# Patient Record
Sex: Male | Born: 1970 | Race: White | Hispanic: No | Marital: Married | Smoking: Never smoker
Health system: Southern US, Community
[De-identification: ages and names within clinical notes are randomized; demographics above are authoritative.]

## PROBLEM LIST (undated history)

## (undated) DIAGNOSIS — R601 Generalized edema: Secondary | ICD-10-CM

## (undated) DIAGNOSIS — R809 Proteinuria, unspecified: Secondary | ICD-10-CM

## (undated) DIAGNOSIS — E785 Hyperlipidemia, unspecified: Secondary | ICD-10-CM

## (undated) DIAGNOSIS — I10 Essential (primary) hypertension: Secondary | ICD-10-CM

## (undated) DIAGNOSIS — E119 Type 2 diabetes mellitus without complications: Secondary | ICD-10-CM

## (undated) DIAGNOSIS — N184 Chronic kidney disease, stage 4 (severe): Secondary | ICD-10-CM

## (undated) DIAGNOSIS — N032 Chronic nephritic syndrome with diffuse membranous glomerulonephritis: Secondary | ICD-10-CM

## (undated) DIAGNOSIS — G4730 Sleep apnea, unspecified: Secondary | ICD-10-CM

## (undated) DIAGNOSIS — C44629 Squamous cell carcinoma of skin of left upper limb, including shoulder: Secondary | ICD-10-CM

## (undated) HISTORY — PX: CARPAL TUNNEL RELEASE: SHX101

## (undated) HISTORY — DX: Essential (primary) hypertension: I10

## (undated) HISTORY — PX: ANTERIOR CERVICAL DECOMP/DISCECTOMY FUSION: SHX1161

## (undated) HISTORY — PX: SQUAMOUS CELL CARCINOMA EXCISION: SHX2433

## (undated) HISTORY — PX: TONSILLECTOMY: SUR1361

## (undated) HISTORY — DX: Hyperlipidemia, unspecified: E78.5

## (undated) HISTORY — DX: Squamous cell carcinoma of skin of left upper limb, including shoulder: C44.629

---

## 2006-06-19 ENCOUNTER — Emergency Department: Payer: Self-pay | Admitting: General Practice

## 2013-04-12 ENCOUNTER — Ambulatory Visit: Payer: Self-pay | Admitting: Anesthesiology

## 2013-04-14 ENCOUNTER — Ambulatory Visit: Payer: Self-pay | Admitting: Unknown Physician Specialty

## 2014-12-28 NOTE — Op Note (Signed)
PATIENT NAME:  Russell Grant, Russell Grant MR#:  [id number] DATE OF BIRTH:  11-23-1970  DATE OF PROCEDURE:  04/14/2013  PREOPERATIVE DIAGNOSIS: Bilateral carpal tunnel syndrome.   POSTOPERATIVE DIAGNOSIS: Bilateral carpal tunnel syndrome.   OPERATION: Bilateral carpal tunnel releases.   SURGEON: Kathrene Alu., MD  ANESTHESIA: General.   HISTORY: The patient had a long history of bilateral carpal tunnel syndrome that was confirmed with nerve conduction studies. He had fairly significant median nerve compression bilaterally.   The patient was ultimately brought in for surgery because of his failure to respond to conservative treatment.   DESCRIPTION OF PROCEDURE: The patient was taken the operating room, where satisfactory general anesthesia was achieved. Tourniquets were applied to both upper arms. Both upper extremities were prepped and draped in the usual fashion for carpal tunnel releases. I will describe the procedure on the right, since the procedure on the left wrist was identical.   The right upper extremity was exsanguinated, and the tourniquet was inflated. A slightly curved incision was made over the volar aspect of the right wrist carpal tunnel. I dissected down to the palmar fascia. The distal edge of the transverse carpal ligament was identified and a hemostat was inserted underneath the ligament to protect the median nerve. I then divided the ligament in its entirety. Distally I used a knife to divide the ligament and proximally I used scissors.   Inspection of the median nerve revealed it was somewhat flattened underneath the ligament. No mass was appreciated within the carpal tunnel, and there was no evidence for proliferative synovitis.   The tourniquet was released. It was up 12 minutes. Bleeding was controlled with coagulation cautery and digital pressure. I went ahead and injected about 8 mL of 0.5% Marcaine into the subcutaneous tissue of the incision and the skin was closed  with 4-0 nylon in vertical mattress fashion. Sterile dressing was applied, followed by removeable-type of Velcro volar splint.   The procedure on the left wrist was identical. The only difference was that the tourniquet was only up 9 minutes on the left. I did anesthetize the wound after the transverse carpal ligament was released. I used about 10 mL of 0.5% Marcaine in the left incision. Again, a sterile dressing applied along with a removal-type Velcro volar splint.   The patient was awakened and transferred to his stretcher bed. He was taken to the recovery room in satisfactory condition. Tourniquet time on the right, as I mentioned, was 12 minutes, and on the left  was 9 minutes. Blood loss was negligible.   ____________________________ Kathrene Alu., MD hbk:OSi D: 04/14/2013 12:43:59 ET T: 04/14/2013 13:01:32 ET JOB#: [id number]  cc: Kathrene Alu., MD, <Dictator> Kathrene Alu MD ELECTRONICALLY SIGNED 04/17/2013 16:36

## 2015-06-11 ENCOUNTER — Encounter: Payer: Self-pay | Admitting: Family Medicine

## 2015-06-11 ENCOUNTER — Ambulatory Visit (INDEPENDENT_AMBULATORY_CARE_PROVIDER_SITE_OTHER): Payer: BLUE CROSS/BLUE SHIELD | Admitting: Family Medicine

## 2015-06-11 VITALS — BP 120/74 | HR 76 | Temp 98.9°F | Ht 70.0 in | Wt 282.0 lb

## 2015-06-11 DIAGNOSIS — E785 Hyperlipidemia, unspecified: Secondary | ICD-10-CM

## 2015-06-11 DIAGNOSIS — E781 Pure hyperglyceridemia: Secondary | ICD-10-CM | POA: Insufficient documentation

## 2015-06-11 DIAGNOSIS — Z23 Encounter for immunization: Secondary | ICD-10-CM

## 2015-06-11 DIAGNOSIS — I10 Essential (primary) hypertension: Secondary | ICD-10-CM | POA: Diagnosis not present

## 2015-06-11 DIAGNOSIS — E7849 Other hyperlipidemia: Secondary | ICD-10-CM | POA: Insufficient documentation

## 2015-06-11 MED ORDER — BENAZEPRIL HCL 40 MG PO TABS: 40.0000 mg | ORAL_TABLET | Freq: Every day | ORAL | Status: DC

## 2015-06-11 MED ORDER — SIMVASTATIN 20 MG PO TABS: 20.0000 mg | ORAL_TABLET | Freq: Every day | ORAL | Status: DC

## 2015-06-11 NOTE — Progress Notes (Signed)
   BP 120/74 mmHg  Pulse 76  Temp(Src) 98.9 F (37.2 C)  Ht 5\' 10"  (1.778 m)  Wt 282 lb (127.914 kg)  BMI 40.46 kg/m2  SpO2 97%   Subjective:    Patient ID: Russell Grant, male    DOB: Jan 16, 1971, 44 y.o.   MRN: [id number]  HPI: Russell Grant is a 44 y.o. male  Chief Complaint  Patient presents with  . Follow-up    patient thinks he missed last appt   Patient doing well with medications  No side effects takes medicines faithfully blood pressures doing well Takes simvastatin without any side effects no myalgias Missed his last physical Patient's been gaining weight this summer. Blood pressure when checked in the office doing okay.  Relevant past medical, surgical, family and social history reviewed and updated as indicated. Interim medical history since our last visit reviewed. Allergies and medications reviewed and updated.  Review of Systems  Constitutional: Negative.   Respiratory: Negative.   Cardiovascular: Negative.     Per HPI unless specifically indicated above     Objective:    BP 120/74 mmHg  Pulse 76  Temp(Src) 98.9 F (37.2 C)  Ht 5\' 10"  (1.778 m)  Wt 282 lb (127.914 kg)  BMI 40.46 kg/m2  SpO2 97%  Wt Readings from Last 3 Encounters:  06/11/15 282 lb (127.914 kg)  11/27/14 275 lb (124.739 kg)    Physical Exam  Constitutional: He is oriented to person, place, and time. He appears well-developed and well-nourished. No distress.  HENT:  Head: Normocephalic and atraumatic.  Right Ear: Hearing normal.  Left Ear: Hearing normal.  Nose: Nose normal.  Eyes: Conjunctivae and lids are normal. Right eye exhibits no discharge. Left eye exhibits no discharge. No scleral icterus.  Cardiovascular: Normal rate, regular rhythm and normal heart sounds.   Pulmonary/Chest: Effort normal and breath sounds normal. No respiratory distress.  Musculoskeletal: Normal range of motion.  Neurological: He is alert and oriented to person, place, and time.  Skin: Skin  is intact. No rash noted.  Psychiatric: He has a normal mood and affect. His speech is normal and behavior is normal. Judgment and thought content normal. Cognition and memory are normal.    No results found for this or any previous visit.    Assessment & Plan:   Problem List Items Addressed This Visit      Cardiovascular and Mediastinum   Hypertension    The current medical regimen is effective;  continue present plan and medications.       Relevant Medications   simvastatin (ZOCOR) 20 MG tablet   benazepril (LOTENSIN) 40 MG tablet     Other   Hyperlipidemia    Discuss continue medications Discuss weight loss Diet Exercise       Relevant Medications   simvastatin (ZOCOR) 20 MG tablet   benazepril (LOTENSIN) 40 MG tablet    Other Visit Diagnoses    Immunization due    -  Primary    Relevant Orders    Flu Vaccine QUAD 36+ mos PF IM (Fluarix & Fluzone Quad PF) (Completed)        Follow up plan: Return in about 2 months (around 08/11/2015), or if symptoms worsen or fail to improve, for Physical Exam.

## 2015-06-11 NOTE — Assessment & Plan Note (Signed)
The current medical regimen is effective;  continue present plan and medications.  

## 2015-06-11 NOTE — Assessment & Plan Note (Signed)
Discuss continue medications Discuss weight loss Diet Exercise

## 2015-08-13 ENCOUNTER — Ambulatory Visit: Payer: BLUE CROSS/BLUE SHIELD | Admitting: Family Medicine

## 2015-08-13 ENCOUNTER — Encounter: Payer: Self-pay | Admitting: Family Medicine

## 2015-08-13 ENCOUNTER — Ambulatory Visit (INDEPENDENT_AMBULATORY_CARE_PROVIDER_SITE_OTHER): Payer: BLUE CROSS/BLUE SHIELD | Admitting: Family Medicine

## 2015-08-13 VITALS — BP 119/74 | HR 76 | Temp 98.3°F | Ht 69.7 in | Wt 282.0 lb

## 2015-08-13 DIAGNOSIS — E785 Hyperlipidemia, unspecified: Secondary | ICD-10-CM | POA: Diagnosis not present

## 2015-08-13 DIAGNOSIS — I10 Essential (primary) hypertension: Secondary | ICD-10-CM | POA: Diagnosis not present

## 2015-08-13 DIAGNOSIS — Z0000 Encounter for general adult medical examination without abnormal findings: Secondary | ICD-10-CM

## 2015-08-13 LAB — URINALYSIS, ROUTINE W REFLEX MICROSCOPIC
Bilirubin, UA: NEGATIVE
Glucose, UA: NEGATIVE
Ketones, UA: NEGATIVE
Leukocytes, UA: NEGATIVE
Nitrite, UA: NEGATIVE
Protein, UA: NEGATIVE
RBC, UA: NEGATIVE
Specific Gravity, UA: 1.025 (ref 1.005–1.030)
Urobilinogen, Ur: 0.2 mg/dL (ref 0.2–1.0)
pH, UA: 5.5 (ref 5.0–7.5)

## 2015-08-13 MED ORDER — SIMVASTATIN 20 MG PO TABS: 20.0000 mg | ORAL_TABLET | Freq: Every day | ORAL | Status: DC

## 2015-08-13 MED ORDER — BENAZEPRIL HCL 40 MG PO TABS: 40.0000 mg | ORAL_TABLET | Freq: Every day | ORAL | Status: DC

## 2015-08-13 NOTE — Assessment & Plan Note (Signed)
The current medical regimen is effective;  continue present plan and medications.  

## 2015-08-13 NOTE — Progress Notes (Signed)
BP 119/74 mmHg  Pulse 76  Temp(Src) 98.3 F (36.8 C)  Ht 5' 9.7" (1.77 m)  Wt 282 lb (127.914 kg)  BMI 40.83 kg/m2   Subjective:    Patient ID: Russell Grant, male    DOB: 02-Dec-1970, 44 y.o.   MRN: [id number]  HPI: Russell Grant is a 44 y.o. male  Chief Complaint  Patient presents with  . Annual Exam   patient also follow-up blood pressure cholesterol takes medications faithfully without side effects  Relevant past medical, surgical, family and social history reviewed and updated as indicated. Interim medical history since our last visit reviewed. Allergies and medications reviewed and updated.  Review of Systems  Constitutional: Negative.   HENT: Negative.   Eyes: Negative.   Respiratory: Negative.   Cardiovascular: Negative.   Gastrointestinal: Negative.   Endocrine: Negative.   Genitourinary: Negative.   Musculoskeletal: Negative.   Skin: Negative.   Allergic/Immunologic: Negative.   Neurological: Negative.   Hematological: Negative.   Psychiatric/Behavioral: Negative.     Per HPI unless specifically indicated above     Objective:    BP 119/74 mmHg  Pulse 76  Temp(Src) 98.3 F (36.8 C)  Ht 5' 9.7" (1.77 m)  Wt 282 lb (127.914 kg)  BMI 40.83 kg/m2  Wt Readings from Last 3 Encounters:  08/13/15 282 lb (127.914 kg)  06/11/15 282 lb (127.914 kg)  11/27/14 275 lb (124.739 kg)    Physical Exam  Constitutional: He is oriented to person, place, and time. He appears well-developed and well-nourished.  HENT:  Head: Normocephalic and atraumatic.  Right Ear: External ear normal.  Left Ear: External ear normal.  Eyes: Conjunctivae and EOM are normal. Pupils are equal, round, and reactive to light.  Neck: Normal range of motion. Neck supple.  Cardiovascular: Normal rate, regular rhythm, normal heart sounds and intact distal pulses.   Pulmonary/Chest: Effort normal and breath sounds normal.  Abdominal: Soft. Bowel sounds are normal. There is no  splenomegaly or hepatomegaly.  Genitourinary: Rectum normal, prostate normal and penis normal.  Musculoskeletal: Normal range of motion.  Neurological: He is alert and oriented to person, place, and time. He has normal reflexes.  Skin: No rash noted. No erythema.  Psychiatric: He has a normal mood and affect. His behavior is normal. Judgment and thought content normal.    No results found for this or any previous visit.    Assessment & Plan:   Problem List Items Addressed This Visit      Cardiovascular and Mediastinum   Hypertension - Primary    The current medical regimen is effective;  continue present plan and medications.       Relevant Medications   benazepril (LOTENSIN) 40 MG tablet   simvastatin (ZOCOR) 20 MG tablet     Other   Hyperlipidemia    The current medical regimen is effective;  continue present plan and medications.       Relevant Medications   benazepril (LOTENSIN) 40 MG tablet   simvastatin (ZOCOR) 20 MG tablet    Other Visit Diagnoses    PE (physical exam), annual        Relevant Orders    Comprehensive metabolic panel    Lipid panel    CBC with Differential/Platelet    PSA    Urinalysis, Routine w reflex microscopic (not at [facility] [address])    TSH        Follow up plan: Return in about 6 months (around 02/11/2016), or if symptoms worsen or  fail to improve, for Med check with BMP, lipids, ALT, AST.

## 2015-08-14 ENCOUNTER — Encounter: Payer: Self-pay | Admitting: Family Medicine

## 2015-08-14 LAB — COMPREHENSIVE METABOLIC PANEL
ALT: 34 IU/L (ref 0–44)
AST: 29 IU/L (ref 0–40)
Albumin/Globulin Ratio: 1.7 (ref 1.1–2.5)
Albumin: 4.7 g/dL (ref 3.5–5.5)
Alkaline Phosphatase: 83 IU/L (ref 39–117)
BUN/Creatinine Ratio: 13 (ref 9–20)
BUN: 13 mg/dL (ref 6–24)
Bilirubin Total: 0.6 mg/dL (ref 0.0–1.2)
CO2: 21 mmol/L (ref 18–29)
Calcium: 9.4 mg/dL (ref 8.7–10.2)
Chloride: 102 mmol/L (ref 97–106)
Creatinine, Ser: 1 mg/dL (ref 0.76–1.27)
GFR calc Af Amer: 105 mL/min/{1.73_m2} (ref >59)
GFR calc non Af Amer: 90 mL/min/{1.73_m2} (ref >59)
Globulin, Total: 2.7 g/dL (ref 1.5–4.5)
Glucose: 91 mg/dL (ref 65–99)
Potassium: 5 mmol/L (ref 3.5–5.2)
Sodium: 144 mmol/L (ref 136–144)
Total Protein: 7.4 g/dL (ref 6.0–8.5)

## 2015-08-14 LAB — PSA: Prostate Specific Ag, Serum: 0.3 ng/mL (ref 0.0–4.0)

## 2015-08-14 LAB — CBC WITH DIFFERENTIAL/PLATELET
Basophils Absolute: 0 10*3/uL (ref 0.0–0.2)
Basos: 0 %
EOS (ABSOLUTE): 0.1 10*3/uL (ref 0.0–0.4)
Eos: 1 %
Hematocrit: 45.3 % (ref 37.5–51.0)
Hemoglobin: 15.5 g/dL (ref 12.6–17.7)
Immature Grans (Abs): 0 10*3/uL (ref 0.0–0.1)
Immature Granulocytes: 0 %
Lymphocytes Absolute: 2.9 10*3/uL (ref 0.7–3.1)
Lymphs: 31 %
MCH: 29.9 pg (ref 26.6–33.0)
MCHC: 34.2 g/dL (ref 31.5–35.7)
MCV: 87 fL (ref 79–97)
Monocytes Absolute: 0.6 10*3/uL (ref 0.1–0.9)
Monocytes: 7 %
Neutrophils Absolute: 5.6 10*3/uL (ref 1.4–7.0)
Neutrophils: 61 %
Platelets: 204 10*3/uL (ref 150–379)
RBC: 5.19 x10E6/uL (ref 4.14–5.80)
RDW: 13.7 % (ref 12.3–15.4)
WBC: 9.3 10*3/uL (ref 3.4–10.8)

## 2015-08-14 LAB — TSH: TSH: 1.61 u[IU]/mL (ref 0.450–4.500)

## 2015-08-14 LAB — LIPID PANEL
Chol/HDL Ratio: 4.5 ratio units (ref 0.0–5.0)
Cholesterol, Total: 144 mg/dL (ref 100–199)
HDL: 32 mg/dL — ABNORMAL LOW (ref >39)
LDL Calculated: 53 mg/dL (ref 0–99)
Triglycerides: 294 mg/dL — ABNORMAL HIGH (ref 0–149)
VLDL Cholesterol Cal: 59 mg/dL — ABNORMAL HIGH (ref 5–40)

## 2016-01-12 ENCOUNTER — Other Ambulatory Visit: Payer: Self-pay | Admitting: Family Medicine

## 2016-02-11 ENCOUNTER — Encounter: Payer: Self-pay | Admitting: Family Medicine

## 2016-02-11 ENCOUNTER — Ambulatory Visit (INDEPENDENT_AMBULATORY_CARE_PROVIDER_SITE_OTHER): Payer: BLUE CROSS/BLUE SHIELD | Admitting: Family Medicine

## 2016-02-11 ENCOUNTER — Other Ambulatory Visit: Payer: Self-pay | Admitting: Family Medicine

## 2016-02-11 VITALS — BP 120/74 | HR 74 | Temp 98.2°F | Ht 70.1 in | Wt 286.0 lb

## 2016-02-11 DIAGNOSIS — E785 Hyperlipidemia, unspecified: Secondary | ICD-10-CM | POA: Diagnosis not present

## 2016-02-11 DIAGNOSIS — I10 Essential (primary) hypertension: Secondary | ICD-10-CM | POA: Diagnosis not present

## 2016-02-11 NOTE — Assessment & Plan Note (Addendum)
Discuss lab results to light. To run here and sent out discuss low triglyceride and low cholesterol diet exercise nutrition patient will continue simvastatin and fish oil.

## 2016-02-11 NOTE — Progress Notes (Signed)
BP 120/74 mmHg  Pulse 74  Temp(Src) 98.2 F (36.8 C)  Ht 5' 10.1" (1.781 m)  Wt 286 lb (129.729 kg)  BMI 40.90 kg/m2  SpO2 98%   Subjective:    Patient ID: Russell Grant, male    DOB: 22-Nov-1970, 45 y.o.   MRN: [id number]  HPI: Russell Grant is a 45 y.o. male  Chief Complaint  Patient presents with  . Hypertension  . Hyperlipidemia  Patient all in all doing well with no complaints from medications over the last month or so has had some difficulty swallowing more pills afternoon or evening pills or no problem. Blood pressures doing well no complaints from simvastatin no cardiovascular symptoms taking medications faithfully without side effects.   Relevant past medical, surgical, family and social history reviewed and updated as indicated. Interim medical history since our last visit reviewed. Allergies and medications reviewed and updated.  Review of Systems  Constitutional: Negative.   Respiratory: Negative.   Cardiovascular: Negative.     Per HPI unless specifically indicated above      Objective:    BP 120/74 mmHg  Pulse 74  Temp(Src) 98.2 F (36.8 C)  Ht 5' 10.1" (1.781 m)  Wt 286 lb (129.729 kg)  BMI 40.90 kg/m2  SpO2 98%  Wt Readings from Last 3 Encounters:  02/11/16 286 lb (129.729 kg)  08/13/15 282 lb (127.914 kg)  06/11/15 282 lb (127.914 kg)    Physical Exam  Constitutional: He is oriented to person, place, and time. He appears well-developed and well-nourished. No distress.  HENT:  Head: Normocephalic and atraumatic.  Right Ear: Hearing normal.  Left Ear: Hearing normal.  Nose: Nose normal.  Eyes: Conjunctivae and lids are normal. Right eye exhibits no discharge. Left eye exhibits no discharge. No scleral icterus.  Cardiovascular: Normal rate, regular rhythm and normal heart sounds.   Pulmonary/Chest: Effort normal and breath sounds normal. No respiratory distress.  Musculoskeletal: Normal range of motion.  Neurological: He is alert and  oriented to person, place, and time.  Skin: Skin is intact. No rash noted.  Psychiatric: He has a normal mood and affect. His speech is normal and behavior is normal. Judgment and thought content normal. Cognition and memory are normal.    Results for orders placed or performed in visit on 08/13/15  Comprehensive metabolic panel  Result Value Ref Range   Glucose 91 65 - 99 mg/dL   BUN 13 6 - 24 mg/dL   Creatinine, Ser 1.00 0.76 - 1.27 mg/dL   GFR calc non Af Amer 90 >59 mL/min/1.73   GFR calc Af Amer 105 >59 mL/min/1.73   BUN/Creatinine Ratio 13 9 - 20   Sodium 144 136 - 144 mmol/L   Potassium 5.0 3.5 - 5.2 mmol/L   Chloride 102 97 - 106 mmol/L   CO2 21 18 - 29 mmol/L   Calcium 9.4 8.7 - 10.2 mg/dL   Total Protein 7.4 6.0 - 8.5 g/dL   Albumin 4.7 3.5 - 5.5 g/dL   Globulin, Total 2.7 1.5 - 4.5 g/dL   Albumin/Globulin Ratio 1.7 1.1 - 2.5   Bilirubin Total 0.6 0.0 - 1.2 mg/dL   Alkaline Phosphatase 83 39 - 117 IU/L   AST 29 0 - 40 IU/L   ALT 34 0 - 44 IU/L  Lipid panel  Result Value Ref Range   Cholesterol, Total 144 100 - 199 mg/dL   Triglycerides 294 (H) 0 - 149 mg/dL   HDL 32 (L) >39 mg/dL  VLDL Cholesterol Cal 59 (H) 5 - 40 mg/dL   LDL Calculated 53 0 - 99 mg/dL   Chol/HDL Ratio 4.5 0.0 - 5.0 ratio units  CBC with Differential/Platelet  Result Value Ref Range   WBC 9.3 3.4 - 10.8 x10E3/uL   RBC 5.19 4.14 - 5.80 x10E6/uL   Hemoglobin 15.5 12.6 - 17.7 g/dL   Hematocrit 45.3 37.5 - 51.0 %   MCV 87 79 - 97 fL   MCH 29.9 26.6 - 33.0 pg   MCHC 34.2 31.5 - 35.7 g/dL   RDW 13.7 12.3 - 15.4 %   Platelets 204 150 - 379 x10E3/uL   Neutrophils 61 %   Lymphs 31 %   Monocytes 7 %   Eos 1 %   Basos 0 %   Neutrophils Absolute 5.6 1.4 - 7.0 x10E3/uL   Lymphocytes Absolute 2.9 0.7 - 3.1 x10E3/uL   Monocytes Absolute 0.6 0.1 - 0.9 x10E3/uL   EOS (ABSOLUTE) 0.1 0.0 - 0.4 x10E3/uL   Basophils Absolute 0.0 0.0 - 0.2 x10E3/uL   Immature Granulocytes 0 %   Immature Grans (Abs) 0.0  0.0 - 0.1 x10E3/uL  PSA  Result Value Ref Range   Prostate Specific Ag, Serum 0.3 0.0 - 4.0 ng/mL  Urinalysis, Routine w reflex microscopic (not at [facility])  Result Value Ref Range   Specific Gravity, UA 1.025 1.005 - 1.030   pH, UA 5.5 5.0 - 7.5   Color, UA Yellow Yellow   Appearance Ur Clear Clear   Leukocytes, UA Negative Negative   Protein, UA Negative Negative/Trace   Glucose, UA Negative Negative   Ketones, UA Negative Negative   RBC, UA Negative Negative   Bilirubin, UA Negative Negative   Urobilinogen, Ur 0.2 0.2 - 1.0 mg/dL   Nitrite, UA Negative Negative  TSH  Result Value Ref Range   TSH 1.610 0.450 - 4.500 uIU/mL      Assessment & Plan:   Problem List Items Addressed This Visit      Cardiovascular and Mediastinum   Hypertension    The current medical regimen is effective;  continue present plan and medications.       Relevant Orders   Basic metabolic panel   LP+ALT+AST Piccolo, Waived     Other   Hyperlipidemia - Primary    Discuss lab results to light. To run here and sent out discuss low triglyceride and low cholesterol diet exercise nutrition patient will continue simvastatin and fish oil.      Relevant Orders   Basic metabolic panel   LP+ALT+AST Piccolo, Waived       Follow up plan: Return in about 6 months (around 08/12/2016) for Physical Exam.

## 2016-02-11 NOTE — Assessment & Plan Note (Signed)
The current medical regimen is effective;  continue present plan and medications.  

## 2016-02-12 ENCOUNTER — Encounter: Payer: Self-pay | Admitting: Family Medicine

## 2016-02-12 LAB — BASIC METABOLIC PANEL
BUN/Creatinine Ratio: 12 (ref 9–20)
BUN: 12 mg/dL (ref 6–24)
CO2: 26 mmol/L (ref 18–29)
Calcium: 9.5 mg/dL (ref 8.7–10.2)
Chloride: 103 mmol/L (ref 96–106)
Creatinine, Ser: 1.04 mg/dL (ref 0.76–1.27)
GFR calc Af Amer: 99 mL/min/{1.73_m2} (ref >59)
GFR calc non Af Amer: 86 mL/min/{1.73_m2} (ref >59)
Glucose: 92 mg/dL (ref 65–99)
Potassium: 5.2 mmol/L (ref 3.5–5.2)
Sodium: 142 mmol/L (ref 134–144)

## 2016-02-13 LAB — LIPID PANEL W/O CHOL/HDL RATIO
Cholesterol, Total: 143 mg/dL (ref 100–199)
HDL: 28 mg/dL — ABNORMAL LOW (ref >39)
LDL Calculated: 39 mg/dL (ref 0–99)
Triglycerides: 381 mg/dL — ABNORMAL HIGH (ref 0–149)
VLDL Cholesterol Cal: 76 mg/dL — ABNORMAL HIGH (ref 5–40)

## 2016-02-13 LAB — LP+ALT+AST PICCOLO, WAIVED
ALT (SGPT) Piccolo, Waived: 32 U/L (ref 10–47)
AST (SGOT) Piccolo, Waived: 31 U/L (ref 11–38)

## 2016-02-13 LAB — SPECIMEN STATUS REPORT

## 2016-07-14 ENCOUNTER — Other Ambulatory Visit: Payer: Self-pay | Admitting: Family Medicine

## 2016-07-14 NOTE — Telephone Encounter (Signed)
Routing to provider  

## 2016-08-18 ENCOUNTER — Encounter: Payer: Self-pay | Admitting: Family Medicine

## 2016-08-18 ENCOUNTER — Ambulatory Visit (INDEPENDENT_AMBULATORY_CARE_PROVIDER_SITE_OTHER): Payer: BLUE CROSS/BLUE SHIELD | Admitting: Family Medicine

## 2016-08-18 VITALS — BP 131/65 | HR 79 | Temp 98.1°F | Ht 69.75 in | Wt 277.2 lb

## 2016-08-18 DIAGNOSIS — E7800 Pure hypercholesterolemia, unspecified: Secondary | ICD-10-CM

## 2016-08-18 DIAGNOSIS — Z0000 Encounter for general adult medical examination without abnormal findings: Secondary | ICD-10-CM

## 2016-08-18 DIAGNOSIS — Z23 Encounter for immunization: Secondary | ICD-10-CM | POA: Diagnosis not present

## 2016-08-18 DIAGNOSIS — Z0001 Encounter for general adult medical examination with abnormal findings: Secondary | ICD-10-CM

## 2016-08-18 DIAGNOSIS — I10 Essential (primary) hypertension: Secondary | ICD-10-CM

## 2016-08-18 LAB — URINALYSIS, ROUTINE W REFLEX MICROSCOPIC
Bilirubin, UA: NEGATIVE
Glucose, UA: NEGATIVE
Ketones, UA: NEGATIVE
Leukocytes, UA: NEGATIVE
Nitrite, UA: NEGATIVE
Protein, UA: NEGATIVE
RBC, UA: NEGATIVE
Specific Gravity, UA: 1.025 (ref 1.005–1.030)
Urobilinogen, Ur: 0.2 mg/dL (ref 0.2–1.0)
pH, UA: 6 (ref 5.0–7.5)

## 2016-08-18 MED ORDER — BENAZEPRIL HCL 40 MG PO TABS: 40.0000 mg | ORAL_TABLET | Freq: Every day | ORAL | 4 refills | Status: DC

## 2016-08-18 MED ORDER — SIMVASTATIN 20 MG PO TABS: 20.0000 mg | ORAL_TABLET | Freq: Every day | ORAL | 4 refills | Status: DC

## 2016-08-18 NOTE — Assessment & Plan Note (Signed)
The current medical regimen is effective;  continue present plan and medications.  

## 2016-08-18 NOTE — Progress Notes (Signed)
BP 131/65 (BP Location: Left Arm, Patient Position: Sitting, Cuff Size: Large)   Pulse 79   Temp 98.1 F (36.7 C)   Ht 5' 9.75" (1.772 m)   Wt 277 lb 3.2 oz (125.7 kg)   SpO2 99%   BMI 40.06 kg/m    Subjective:    Patient ID: Russell Grant, male    DOB: [birth year], 45 y.o.   MRN: [id number]  HPI: Russell Grant is a 45 y.o. male  Chief Complaint  Patient presents with  . Annual Exam  Patient doing well with Benzapril simvastatin no issues or concerns taking without problems and faithfully. Patient's also 9 pounds since this summer Relevant past medical, surgical, family and social history reviewed and updated as indicated. Interim medical history since our last visit reviewed. Allergies and medications reviewed and updated.  Review of Systems  Constitutional: Negative.   HENT: Negative.   Eyes: Negative.   Respiratory: Negative.   Cardiovascular: Negative.   Gastrointestinal: Negative.   Endocrine: Negative.   Genitourinary: Negative.   Musculoskeletal: Negative.   Skin: Negative.   Allergic/Immunologic: Negative.   Neurological: Negative.   Hematological: Negative.   Psychiatric/Behavioral: Negative.     Per HPI unless specifically indicated above     Objective:    BP 131/65 (BP Location: Left Arm, Patient Position: Sitting, Cuff Size: Large)   Pulse 79   Temp 98.1 F (36.7 C)   Ht 5' 9.75" (1.772 m)   Wt 277 lb 3.2 oz (125.7 kg)   SpO2 99%   BMI 40.06 kg/m   Wt Readings from Last 3 Encounters:  08/18/16 277 lb 3.2 oz (125.7 kg)  02/11/16 286 lb (129.7 kg)  08/13/15 282 lb (127.9 kg)    Physical Exam  Constitutional: He is oriented to person, place, and time. He appears well-developed and well-nourished.  HENT:  Head: Normocephalic and atraumatic.  Right Ear: External ear normal.  Left Ear: External ear normal.  Eyes: Conjunctivae and EOM are normal. Pupils are equal, round, and reactive to light.  Neck: Normal range of motion. Neck supple.    Cardiovascular: Normal rate, regular rhythm, normal heart sounds and intact distal pulses.   Pulmonary/Chest: Effort normal and breath sounds normal.  Abdominal: Soft. Bowel sounds are normal. There is no splenomegaly or hepatomegaly.  Genitourinary: Rectum normal, prostate normal and penis normal.  Musculoskeletal: Normal range of motion.  Neurological: He is alert and oriented to person, place, and time. He has normal reflexes.  Skin: No rash noted. No erythema.  Psychiatric: He has a normal mood and affect. His behavior is normal. Judgment and thought content normal.    Results for orders placed or performed in visit on 02/11/16  Lipid Panel w/o Chol/HDL Ratio  Result Value Ref Range   Cholesterol, Total 143 100 - 199 mg/dL   Triglycerides 381 (H) 0 - 149 mg/dL   HDL 28 (L) >39 mg/dL   VLDL Cholesterol Cal 76 (H) 5 - 40 mg/dL   LDL Calculated 39 0 - 99 mg/dL  Specimen status report  Result Value Ref Range   specimen status report Comment       Assessment & Plan:   Problem List Items Addressed This Visit      Cardiovascular and Mediastinum   Hypertension    The current medical regimen is effective;  continue present plan and medications.       Relevant Medications   simvastatin (ZOCOR) 20 MG tablet   benazepril (LOTENSIN) 40 MG  tablet     Other   Hyperlipidemia    The current medical regimen is effective;  continue present plan and medications.       Relevant Medications   simvastatin (ZOCOR) 20 MG tablet   benazepril (LOTENSIN) 40 MG tablet    Other Visit Diagnoses    Need for influenza vaccination    -  Primary   Relevant Orders   Flu Vaccine QUAD 36+ mos PF IM (Fluarix & Fluzone Quad PF) (Completed)   PE (physical exam), annual       Relevant Orders   Comprehensive metabolic panel   Lipid panel   CBC with Differential/Platelet   TSH   Urinalysis, Routine w reflex microscopic       Follow up plan: Return for BMP,  Lipids, ALT, AST.

## 2016-08-19 ENCOUNTER — Encounter: Payer: Self-pay | Admitting: Family Medicine

## 2016-08-19 LAB — CBC WITH DIFFERENTIAL/PLATELET
Basophils Absolute: 0 10*3/uL (ref 0.0–0.2)
Basos: 0 %
EOS (ABSOLUTE): 0.1 10*3/uL (ref 0.0–0.4)
Eos: 1 %
Hematocrit: 44.6 % (ref 37.5–51.0)
Hemoglobin: 14.8 g/dL (ref 13.0–17.7)
Immature Grans (Abs): 0 10*3/uL (ref 0.0–0.1)
Immature Granulocytes: 0 %
Lymphocytes Absolute: 2.9 10*3/uL (ref 0.7–3.1)
Lymphs: 30 %
MCH: 29.6 pg (ref 26.6–33.0)
MCHC: 33.2 g/dL (ref 31.5–35.7)
MCV: 89 fL (ref 79–97)
Monocytes Absolute: 0.6 10*3/uL (ref 0.1–0.9)
Monocytes: 6 %
Neutrophils Absolute: 5.8 10*3/uL (ref 1.4–7.0)
Neutrophils: 63 %
Platelets: 231 10*3/uL (ref 150–379)
RBC: 5 x10E6/uL (ref 4.14–5.80)
RDW: 14 % (ref 12.3–15.4)
WBC: 9.4 10*3/uL (ref 3.4–10.8)

## 2016-08-19 LAB — LIPID PANEL
Chol/HDL Ratio: 4.5 ratio units (ref 0.0–5.0)
Cholesterol, Total: 125 mg/dL (ref 100–199)
HDL: 28 mg/dL — ABNORMAL LOW (ref >39)
LDL Calculated: 40 mg/dL (ref 0–99)
Triglycerides: 285 mg/dL — ABNORMAL HIGH (ref 0–149)
VLDL Cholesterol Cal: 57 mg/dL — ABNORMAL HIGH (ref 5–40)

## 2016-08-19 LAB — COMPREHENSIVE METABOLIC PANEL
ALT: 33 IU/L (ref 0–44)
AST: 29 IU/L (ref 0–40)
Albumin/Globulin Ratio: 1.5 (ref 1.2–2.2)
Albumin: 4.6 g/dL (ref 3.5–5.5)
Alkaline Phosphatase: 82 IU/L (ref 39–117)
BUN/Creatinine Ratio: 15 (ref 9–20)
BUN: 16 mg/dL (ref 6–24)
Bilirubin Total: 0.7 mg/dL (ref 0.0–1.2)
CO2: 27 mmol/L (ref 18–29)
Calcium: 9.3 mg/dL (ref 8.7–10.2)
Chloride: 101 mmol/L (ref 96–106)
Creatinine, Ser: 1.05 mg/dL (ref 0.76–1.27)
GFR calc Af Amer: 98 mL/min/{1.73_m2} (ref >59)
GFR calc non Af Amer: 85 mL/min/{1.73_m2} (ref >59)
Globulin, Total: 3 g/dL (ref 1.5–4.5)
Glucose: 86 mg/dL (ref 65–99)
Potassium: 5 mmol/L (ref 3.5–5.2)
Sodium: 141 mmol/L (ref 134–144)
Total Protein: 7.6 g/dL (ref 6.0–8.5)

## 2016-08-19 LAB — TSH: TSH: 1.64 u[IU]/mL (ref 0.450–4.500)

## 2016-10-11 ENCOUNTER — Other Ambulatory Visit: Payer: Self-pay | Admitting: Family Medicine

## 2016-10-11 DIAGNOSIS — E785 Hyperlipidemia, unspecified: Secondary | ICD-10-CM

## 2016-10-12 NOTE — Telephone Encounter (Signed)
Last OV: 08/18/16 Next OV: 02/13/17  Lab Results  Component Value Date   CHOL 125 08/18/2016   HDL 28 (L) 08/18/2016   LDLCALC 40 08/18/2016   TRIG 285 (H) 08/18/2016   CHOLHDL 4.5 08/18/2016   Lab Results  Component Value Date   CREATININE 1.05 08/18/2016   BUN 16 08/18/2016   NA 141 08/18/2016   K 5.0 08/18/2016   CL 101 08/18/2016   CO2 27 08/18/2016

## 2017-02-11 ENCOUNTER — Encounter: Payer: Self-pay | Admitting: Family Medicine

## 2017-02-23 ENCOUNTER — Ambulatory Visit: Payer: BLUE CROSS/BLUE SHIELD | Admitting: Family Medicine

## 2017-03-16 ENCOUNTER — Ambulatory Visit (INDEPENDENT_AMBULATORY_CARE_PROVIDER_SITE_OTHER): Payer: PRIVATE HEALTH INSURANCE | Admitting: Family Medicine

## 2017-03-16 ENCOUNTER — Encounter: Payer: Self-pay | Admitting: Family Medicine

## 2017-03-16 VITALS — BP 127/81 | HR 77 | Wt 277.0 lb

## 2017-03-16 DIAGNOSIS — I10 Essential (primary) hypertension: Secondary | ICD-10-CM

## 2017-03-16 DIAGNOSIS — E7800 Pure hypercholesterolemia, unspecified: Secondary | ICD-10-CM

## 2017-03-16 NOTE — Assessment & Plan Note (Signed)
The current medical regimen is effective;  continue present plan and medications.  

## 2017-03-16 NOTE — Progress Notes (Signed)
BP 127/81   Pulse 77   Wt 277 lb (125.6 kg)   SpO2 97%   BMI 40.03 kg/m    Subjective:    Patient ID: Russell Grant, male    DOB: [birth year], 46 y.o.   MRN: [id number]  HPI: Russell Grant is a 46 y.o. male  Chief Complaint  Patient presents with  . Follow-up  . Back Pain    scoliosis worsening possibly?    Patient follow-up hypertension hypercholesterol taking medications faithfully without problems or side effects. 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 Interim medical history since our last visit reviewed. Allergies and medications reviewed and updated.  Review of Systems  Constitutional: Negative.   Respiratory: Negative.   Cardiovascular: Negative.     Per HPI unless specifically indicated above     Objective:    BP 127/81   Pulse 77   Wt 277 lb (125.6 kg)   SpO2 97%   BMI 40.03 kg/m   Wt Readings from Last 3 Encounters:  03/16/17 277 lb (125.6 kg)  08/18/16 277 lb 3.2 oz (125.7 kg)  02/11/16 286 lb (129.7 kg)    Physical Exam  Constitutional: He is oriented to person, place, and time. He appears well-developed and well-nourished.  HENT:  Head: Normocephalic and atraumatic.  Eyes: Conjunctivae and EOM are normal.  Neck: Normal range of motion.  Cardiovascular: Normal rate, regular rhythm and normal heart sounds.   Pulmonary/Chest: Effort normal and breath sounds normal.  Musculoskeletal: Normal range of motion.  Neurological: He is alert and oriented to person, place, and time.  Skin: No erythema.  Psychiatric: He has a normal mood and affect. His behavior is normal. Judgment and thought content normal.    Results for orders placed or performed in visit on 08/18/16  Comprehensive metabolic panel  Result Value Ref Range   Glucose 86 65 - 99  mg/dL   BUN 16 6 - 24 mg/dL   Creatinine, Ser 1.05 0.76 - 1.27 mg/dL   GFR calc non Af Amer 85 >59 mL/min/1.73   GFR calc Af Amer 98 >59 mL/min/1.73   BUN/Creatinine Ratio 15 9 - 20   Sodium 141 134 - 144 mmol/L   Potassium 5.0 3.5 - 5.2 mmol/L   Chloride 101 96 - 106 mmol/L   CO2 27 18 - 29 mmol/L   Calcium 9.3 8.7 - 10.2 mg/dL   Total Protein 7.6 6.0 - 8.5 g/dL   Albumin 4.6 3.5 - 5.5 g/dL   Globulin, Total 3.0 1.5 - 4.5 g/dL   Albumin/Globulin Ratio 1.5 1.2 - 2.2   Bilirubin Total 0.7 0.0 - 1.2 mg/dL   Alkaline Phosphatase 82 39 - 117 IU/L   AST 29 0 - 40 IU/L   ALT 33 0 - 44 IU/L  Lipid panel  Result Value Ref Range   Cholesterol, Total 125 100 - 199 mg/dL   Triglycerides 285 (H) 0 - 149 mg/dL   HDL 28 (L) >39 mg/dL   VLDL Cholesterol Cal 57 (H) 5 - 40 mg/dL   LDL Calculated 40 0 - 99 mg/dL   Chol/HDL Ratio 4.5 0.0 - 5.0 ratio units  CBC with Differential/Platelet  Result Value Ref Range   WBC 9.4 3.4 - 10.8 x10E3/uL   RBC 5.00 4.14 - 5.80 x10E6/uL   Hemoglobin  14.8 13.0 - 17.7 g/dL   Hematocrit 44.6 37.5 - 51.0 %   MCV 89 79 - 97 fL   MCH 29.6 26.6 - 33.0 pg   MCHC 33.2 31.5 - 35.7 g/dL   RDW 14.0 12.3 - 15.4 %   Platelets 231 150 - 379 x10E3/uL   Neutrophils 63 Not Estab. %   Lymphs 30 Not Estab. %   Monocytes 6 Not Estab. %   Eos 1 Not Estab. %   Basos 0 Not Estab. %   Neutrophils Absolute 5.8 1.4 - 7.0 x10E3/uL   Lymphocytes Absolute 2.9 0.7 - 3.1 x10E3/uL   Monocytes Absolute 0.6 0.1 - 0.9 x10E3/uL   EOS (ABSOLUTE) 0.1 0.0 - 0.4 x10E3/uL   Basophils Absolute 0.0 0.0 - 0.2 x10E3/uL   Immature Granulocytes 0 Not Estab. %   Immature Grans (Abs) 0.0 0.0 - 0.1 x10E3/uL  TSH  Result Value Ref Range   TSH 1.640 0.450 - 4.500 uIU/mL  Urinalysis, Routine w reflex microscopic  Result Value Ref Range   Specific Gravity, UA 1.025 1.005 - 1.030   pH, UA 6.0 5.0 - 7.5   Color, UA Yellow Yellow   Appearance Ur Clear Clear   Leukocytes, UA Negative Negative    Protein, UA Negative Negative/Trace   Glucose, UA Negative Negative   Ketones, UA Negative Negative   RBC, UA Negative Negative   Bilirubin, UA Negative Negative   Urobilinogen, Ur 0.2 0.2 - 1.0 mg/dL   Nitrite, UA Negative Negative      Assessment & Plan:   Problem List Items Addressed This Visit      Cardiovascular and Mediastinum   Hypertension - Primary    The current medical regimen is effective;  continue present plan and medications.         Other   Hyperlipidemia    The current medical regimen is effective;  continue present plan and medications.         Discussed back care and treatment especially scoliosis proper lifting techniques and delegation of lifting tasks.  Follow up plan: Return in about 6 months (around 09/16/2017) for Physical Exam.

## 2017-03-17 ENCOUNTER — Encounter: Payer: Self-pay | Admitting: Family Medicine

## 2017-03-17 LAB — LP+ALT+AST PICCOLO, WAIVED
ALT (SGPT) Piccolo, Waived: 35 U/L (ref 10–47)
AST (SGOT) Piccolo, Waived: 40 U/L — ABNORMAL HIGH (ref 11–38)
Chol/HDL Ratio Piccolo,Waive: 4.4 mg/dL
Cholesterol Piccolo, Waived: 139 mg/dL (ref <200)
HDL Chol Piccolo, Waived: 31 mg/dL — ABNORMAL LOW (ref >59)
LDL Chol Calc Piccolo Waived: 44 mg/dL (ref <100)
Triglycerides Piccolo,Waived: 317 mg/dL — ABNORMAL HIGH (ref <150)
VLDL Chol Calc Piccolo,Waive: 63 mg/dL — ABNORMAL HIGH (ref <30)

## 2017-03-17 LAB — BASIC METABOLIC PANEL
BUN/Creatinine Ratio: 12 (ref 9–20)
BUN: 12 mg/dL (ref 6–24)
CO2: 23 mmol/L (ref 20–29)
Calcium: 9.4 mg/dL (ref 8.7–10.2)
Chloride: 104 mmol/L (ref 96–106)
Creatinine, Ser: 1.02 mg/dL (ref 0.76–1.27)
GFR calc Af Amer: 101 mL/min/{1.73_m2} (ref >59)
GFR calc non Af Amer: 87 mL/min/{1.73_m2} (ref >59)
Glucose: 85 mg/dL (ref 65–99)
Potassium: 4.7 mmol/L (ref 3.5–5.2)
Sodium: 142 mmol/L (ref 134–144)

## 2017-08-24 ENCOUNTER — Ambulatory Visit (INDEPENDENT_AMBULATORY_CARE_PROVIDER_SITE_OTHER): Payer: PRIVATE HEALTH INSURANCE | Admitting: Family Medicine

## 2017-08-24 ENCOUNTER — Encounter: Payer: Self-pay | Admitting: Family Medicine

## 2017-08-24 VITALS — BP 127/77 | HR 80 | Temp 98.3°F | Ht 70.5 in | Wt 281.8 lb

## 2017-08-24 DIAGNOSIS — Z6835 Body mass index (BMI) 35.0-35.9, adult: Secondary | ICD-10-CM | POA: Diagnosis not present

## 2017-08-24 DIAGNOSIS — I10 Essential (primary) hypertension: Secondary | ICD-10-CM | POA: Diagnosis not present

## 2017-08-24 DIAGNOSIS — E7800 Pure hypercholesterolemia, unspecified: Secondary | ICD-10-CM

## 2017-08-24 DIAGNOSIS — Z0001 Encounter for general adult medical examination with abnormal findings: Secondary | ICD-10-CM | POA: Diagnosis not present

## 2017-08-24 DIAGNOSIS — C44621 Squamous cell carcinoma of skin of unspecified upper limb, including shoulder: Secondary | ICD-10-CM | POA: Insufficient documentation

## 2017-08-24 DIAGNOSIS — Z0000 Encounter for general adult medical examination without abnormal findings: Secondary | ICD-10-CM

## 2017-08-24 DIAGNOSIS — C44629 Squamous cell carcinoma of skin of left upper limb, including shoulder: Secondary | ICD-10-CM

## 2017-08-24 LAB — URINALYSIS, ROUTINE W REFLEX MICROSCOPIC
Bilirubin, UA: NEGATIVE
Glucose, UA: NEGATIVE
Ketones, UA: NEGATIVE
Leukocytes, UA: NEGATIVE
Nitrite, UA: NEGATIVE
Protein, UA: NEGATIVE
RBC, UA: NEGATIVE
Specific Gravity, UA: >1.03 — ABNORMAL HIGH (ref 1.005–1.030)
Urobilinogen, Ur: 0.2 mg/dL (ref 0.2–1.0)
pH, UA: 5 (ref 5.0–7.5)

## 2017-08-24 MED ORDER — SIMVASTATIN 20 MG PO TABS: 20.0000 mg | ORAL_TABLET | Freq: Every day | ORAL | 4 refills | Status: DC

## 2017-08-24 MED ORDER — BENAZEPRIL HCL 40 MG PO TABS: 40.0000 mg | ORAL_TABLET | Freq: Every day | ORAL | 4 refills | Status: DC

## 2017-08-24 NOTE — Assessment & Plan Note (Signed)
Surgery scheduled next month

## 2017-08-24 NOTE — Progress Notes (Signed)
BP 127/77 (BP Location: Left Arm, Patient Position: Sitting, Cuff Size: Normal)   Pulse 80   Temp 98.3 F (36.8 C) (Temporal)   Ht 5' 10.5" (1.791 m)   Wt 281 lb 12.8 oz (127.8 kg)   SpO2 98%   BMI 39.86 kg/m    Subjective:    Patient ID: Russell Grant, male    DOB: February 19, 1971, 46 y.o.   MRN: [id number]  HPI: Russell Grant is a 46 y.o. male  Chief Complaint  Patient presents with  . Annual Exam  patient with about 2 months worth of right wrist discomfort especially with use such as holding Benadryl and will get pain at the base of his right thumb into the wrist area.Has tried some Advil or Aleve which seems to help with his taken it. Has started using a wrist splint at night which may or may not be helping. And recently he's been trying to do less with his wrist but still has some occasional pain.Blood pressure doing okay no issues.  Cholesterol also doing well on simvastatin. 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   Relevant past medical, surgical, family and social history reviewed and updated as indicated. Interim medical history since our last visit reviewed. Allergies and medications reviewed and updated.  Review of Systems  Constitutional: Negative.   HENT: Negative.   Eyes: Negative.   Respiratory: Negative.   Cardiovascular: Negative.   Gastrointestinal: Negative.   Endocrine: Negative.   Genitourinary: Negative.   Musculoskeletal: Negative.   Skin: Negative.   Allergic/Immunologic: Negative.   Neurological: Negative.   Hematological: Negative.   Psychiatric/Behavioral: Negative.     Per HPI unless specifically indicated above     Objective:    BP 127/77 (BP Location: Left Arm, Patient Position: Sitting, Cuff Size: Normal)   Pulse 80   Temp 98.3 F (36.8 C) (Temporal)   Ht 5' 10.5" (1.791 m)   Wt 281 lb 12.8 oz (127.8 kg)   SpO2 98%   BMI 39.86 kg/m   Wt Readings from Last 3 Encounters:    08/24/17 281 lb 12.8 oz (127.8 kg)  03/16/17 277 lb (125.6 kg)  08/18/16 277 lb 3.2 oz (125.7 kg)    Physical Exam  Constitutional: He is oriented to person, place, and time. He appears well-developed and well-nourished.  HENT:  Head: Normocephalic and atraumatic.  Right Ear: External ear normal.  Left Ear: External ear normal.  Eyes: Conjunctivae and EOM are normal. Pupils are equal, round, and reactive to light.  Neck: Normal range of motion. Neck supple.  Cardiovascular: Normal rate, regular rhythm, normal heart sounds and intact distal pulses.  Pulmonary/Chest: Effort normal and breath sounds normal.  Abdominal: Soft. Bowel sounds are normal. There is no splenomegaly or hepatomegaly.  Genitourinary: Rectum normal, prostate normal and penis normal.  Musculoskeletal: Normal range of motion.  Neurological: He is alert and oriented to person, place, and time. He has normal reflexes.  Skin: No rash noted. No erythema.  Psychiatric: He has a normal mood and affect. His behavior is normal. Judgment and thought content normal.    Results for orders placed or performed in visit on 13/24/40  Basic metabolic panel  Result Value Ref Range   Glucose 85 65 - 99 mg/dL   BUN 12 6 - 24 mg/dL   Creatinine, Ser 1.02 0.76 - 1.27 mg/dL   GFR calc non Af Amer 87 >59 mL/min/1.73   GFR calc  Af Amer 101 >59 mL/min/1.73   BUN/Creatinine Ratio 12 9 - 20   Sodium 142 134 - 144 mmol/L   Potassium 4.7 3.5 - 5.2 mmol/L   Chloride 104 96 - 106 mmol/L   CO2 23 20 - 29 mmol/L   Calcium 9.4 8.7 - 10.2 mg/dL  LP+ALT+AST Piccolo, Waived  Result Value Ref Range   ALT (SGPT) Piccolo, Waived 35 10 - 47 U/L   AST (SGOT) Piccolo, Waived 40 (H) 11 - 38 U/L   Cholesterol Piccolo, Waived 139 <200 mg/dL   HDL Chol Piccolo, Waived 31 (L) >59 mg/dL   Triglycerides Piccolo,Waived 317 (H) <150 mg/dL   Chol/HDL Ratio Piccolo,Waive 4.4 mg/dL   LDL Chol Calc Piccolo Waived 44 <100 mg/dL   VLDL Chol Calc Piccolo,Waive  63 (H) <30 mg/dL      Assessment & Plan:   Problem List Items Addressed This Visit      Cardiovascular and Mediastinum   Hypertension    The current medical regimen is effective;  continue present plan and medications.       Relevant Medications   benazepril (LOTENSIN) 40 MG tablet   simvastatin (ZOCOR) 20 MG tablet     Musculoskeletal and Integument   SCC (squamous cell carcinoma), hand    Surgery scheduled next month        Other   Hyperlipidemia    The current medical regimen is effective;  continue present plan and medications.       Relevant Medications   benazepril (LOTENSIN) 40 MG tablet   simvastatin (ZOCOR) 20 MG tablet   Severe obesity (BMI 35.0-39.9) with comorbidity ([facility])    Discussed diet and weight loss       Other Visit Diagnoses    PE (physical exam), annual    -  Primary   Relevant Orders   Comprehensive metabolic panel   Lipid panel   CBC with Differential/Platelet   TSH   Urinalysis, Routine w reflex microscopic   PSA       Follow up plan: Return in about 6 months (around 02/22/2018) for BMP,  Lipids, ALT, AST.

## 2017-08-24 NOTE — Assessment & Plan Note (Signed)
The current medical regimen is effective;  continue present plan and medications.  

## 2017-08-24 NOTE — Assessment & Plan Note (Signed)
Discussed diet and weight loss. 

## 2017-08-25 ENCOUNTER — Encounter: Payer: Self-pay | Admitting: Family Medicine

## 2017-08-25 LAB — CBC WITH DIFFERENTIAL/PLATELET
Basophils Absolute: 0 10*3/uL (ref 0.0–0.2)
Basos: 0 %
EOS (ABSOLUTE): 0.1 10*3/uL (ref 0.0–0.4)
Eos: 1 %
Hematocrit: 44.9 % (ref 37.5–51.0)
Hemoglobin: 15.5 g/dL (ref 13.0–17.7)
Immature Grans (Abs): 0 10*3/uL (ref 0.0–0.1)
Immature Granulocytes: 0 %
Lymphocytes Absolute: 2.4 10*3/uL (ref 0.7–3.1)
Lymphs: 26 %
MCH: 30.2 pg (ref 26.6–33.0)
MCHC: 34.5 g/dL (ref 31.5–35.7)
MCV: 87 fL (ref 79–97)
Monocytes Absolute: 0.6 10*3/uL (ref 0.1–0.9)
Monocytes: 7 %
Neutrophils Absolute: 6 10*3/uL (ref 1.4–7.0)
Neutrophils: 66 %
Platelets: 205 10*3/uL (ref 150–379)
RBC: 5.14 x10E6/uL (ref 4.14–5.80)
RDW: 14 % (ref 12.3–15.4)
WBC: 9.2 10*3/uL (ref 3.4–10.8)

## 2017-08-25 LAB — COMPREHENSIVE METABOLIC PANEL
ALT: 41 IU/L (ref 0–44)
AST: 32 IU/L (ref 0–40)
Albumin/Globulin Ratio: 1.8 (ref 1.2–2.2)
Albumin: 4.8 g/dL (ref 3.5–5.5)
Alkaline Phosphatase: 78 IU/L (ref 39–117)
BUN/Creatinine Ratio: 13 (ref 9–20)
BUN: 13 mg/dL (ref 6–24)
Bilirubin Total: 0.7 mg/dL (ref 0.0–1.2)
CO2: 26 mmol/L (ref 20–29)
Calcium: 9.6 mg/dL (ref 8.7–10.2)
Chloride: 103 mmol/L (ref 96–106)
Creatinine, Ser: 0.99 mg/dL (ref 0.76–1.27)
GFR calc Af Amer: 104 mL/min/{1.73_m2} (ref >59)
GFR calc non Af Amer: 90 mL/min/{1.73_m2} (ref >59)
Globulin, Total: 2.6 g/dL (ref 1.5–4.5)
Glucose: 90 mg/dL (ref 65–99)
Potassium: 4.5 mmol/L (ref 3.5–5.2)
Sodium: 144 mmol/L (ref 134–144)
Total Protein: 7.4 g/dL (ref 6.0–8.5)

## 2017-08-25 LAB — TSH: TSH: 1.25 u[IU]/mL (ref 0.450–4.500)

## 2017-08-25 LAB — LIPID PANEL
Chol/HDL Ratio: 4.5 ratio (ref 0.0–5.0)
Cholesterol, Total: 134 mg/dL (ref 100–199)
HDL: 30 mg/dL — ABNORMAL LOW (ref >39)
LDL Calculated: 50 mg/dL (ref 0–99)
Triglycerides: 270 mg/dL — ABNORMAL HIGH (ref 0–149)
VLDL Cholesterol Cal: 54 mg/dL — ABNORMAL HIGH (ref 5–40)

## 2017-08-25 LAB — PSA: Prostate Specific Ag, Serum: 1.2 ng/mL (ref 0.0–4.0)

## 2017-09-06 ENCOUNTER — Telehealth: Payer: Self-pay | Admitting: Family Medicine

## 2017-09-06 NOTE — Telephone Encounter (Signed)
Pt called in to request a call back from provider. Pt wouldn't go into detail about what he need, he just stated that he have a question about prostate. Pt is requesting a call back at: [phone]

## 2017-09-06 NOTE — Telephone Encounter (Signed)
Called and spoke to patient. He would prefer to wait on Dr. Jeananne Rama to return then to discuss w/ someone else.

## 2017-09-10 DIAGNOSIS — Z85828 Personal history of other malignant neoplasm of skin: Secondary | ICD-10-CM | POA: Insufficient documentation

## 2017-09-13 ENCOUNTER — Encounter: Payer: Self-pay | Admitting: Family Medicine

## 2017-09-13 NOTE — Telephone Encounter (Signed)
Call pt 

## 2017-09-14 NOTE — Telephone Encounter (Signed)
Pt was not at home.

## 2017-09-15 NOTE — Telephone Encounter (Signed)
Phone call Discussed with patient having some prostate leakage with straining bending and stooping lifting activities. Reviewed with patient if becomes worse or more problematic will refer.

## 2017-12-22 LAB — LIPID PANEL
Cholesterol: 99 (ref 0–200)
HDL: 26 — AB (ref 35–70)
LDL Cholesterol: 39
Triglycerides: 171 — AB (ref 40–160)

## 2017-12-22 LAB — PSA: PSA: 0.7

## 2018-02-03 ENCOUNTER — Telehealth: Payer: Self-pay | Admitting: Family Medicine

## 2018-02-03 ENCOUNTER — Other Ambulatory Visit: Payer: Self-pay | Admitting: Family Medicine

## 2018-02-03 DIAGNOSIS — I10 Essential (primary) hypertension: Secondary | ICD-10-CM

## 2018-02-03 DIAGNOSIS — E7800 Pure hypercholesterolemia, unspecified: Secondary | ICD-10-CM

## 2018-02-03 NOTE — Telephone Encounter (Unsigned)
Copied from [place] [id number]. Topic: Quick Communication - Rx Refill/Question >> Feb 03, 2018  5:14 PM Neva Seat wrote: benazepril (LOTENSIN) 40 MG tablet simvastatin (ZOCOR) 20 MG tablet  Needing meds refills  [id number] - [place], [place] - [address] [place] [address] Phone: [phone] Fax: [phone]

## 2018-02-03 NOTE — Telephone Encounter (Signed)
Patient is calling to check on this. States has no pills to take tonight.

## 2018-02-04 NOTE — Telephone Encounter (Signed)
Looks like they were ordered today

## 2018-02-22 ENCOUNTER — Ambulatory Visit: Payer: PRIVATE HEALTH INSURANCE | Admitting: Family Medicine

## 2018-07-23 DIAGNOSIS — H52223 Regular astigmatism, bilateral: Secondary | ICD-10-CM | POA: Diagnosis not present

## 2019-03-09 ENCOUNTER — Other Ambulatory Visit: Payer: Self-pay | Admitting: Family Medicine

## 2019-03-09 DIAGNOSIS — I10 Essential (primary) hypertension: Secondary | ICD-10-CM

## 2019-03-09 NOTE — Telephone Encounter (Signed)
Forwarding medication refill to PCP for review. 

## 2019-03-16 ENCOUNTER — Other Ambulatory Visit: Payer: Self-pay

## 2019-03-16 ENCOUNTER — Ambulatory Visit: Payer: 59

## 2019-03-16 DIAGNOSIS — Z0283 Encounter for blood-alcohol and blood-drug test: Secondary | ICD-10-CM

## 2019-03-22 ENCOUNTER — Other Ambulatory Visit: Payer: Self-pay

## 2019-03-22 ENCOUNTER — Ambulatory Visit: Payer: 59

## 2019-03-22 DIAGNOSIS — Z01818 Encounter for other preprocedural examination: Secondary | ICD-10-CM

## 2019-03-22 LAB — POCT URINE DRUG SCREEN

## 2019-03-22 LAB — POCT URINALYSIS DIPSTICK
Bilirubin, UA: NEGATIVE
Blood, UA: NEGATIVE
Glucose, UA: NEGATIVE
Nitrite, UA: NEGATIVE
Protein, UA: NEGATIVE
Spec Grav, UA: >=1.03 — AB (ref 1.010–1.025)
Urobilinogen, UA: 0.2 E.U./dL
pH, UA: 5.5 (ref 5.0–8.0)

## 2019-03-23 LAB — CMP12+LP+TP+TSH+6AC+PSA+CBC?
Basophils Absolute: 0.1 10*3/uL (ref 0.0–0.2)
Bilirubin Total: 1.2 mg/dL (ref 0.0–1.2)
Chloride: 103 mmol/L (ref 96–106)
Eos: 3 %
GFR calc Af Amer: 61 mL/min/{1.73_m2} (ref >59)
GGT: 18 IU/L (ref 0–65)
MCV: 89 fL (ref 79–97)
Platelets: 183 10*3/uL (ref 150–450)
Potassium: 4.6 mmol/L (ref 3.5–5.2)
Prostate Specific Ag, Serum: 0.8 ng/mL (ref 0.0–4.0)
Total Protein: 7.1 g/dL (ref 6.0–8.5)

## 2019-03-23 LAB — CMP12+LP+TP+TSH+6AC+PSA+CBC…
ALT: 28 IU/L (ref 0–44)
AST: 28 IU/L (ref 0–40)
Albumin/Globulin Ratio: 2.4 — ABNORMAL HIGH (ref 1.2–2.2)
Albumin: 5 g/dL (ref 4.0–5.0)
Alkaline Phosphatase: 68 IU/L (ref 39–117)
BUN/Creatinine Ratio: 20 (ref 9–20)
BUN: 31 mg/dL — ABNORMAL HIGH (ref 6–24)
Basos: 1 %
Calcium: 9.6 mg/dL (ref 8.7–10.2)
Chol/HDL Ratio: 3.8 ratio (ref 0.0–5.0)
Cholesterol, Total: 114 mg/dL (ref 100–199)
Creatinine, Ser: 1.52 mg/dL — ABNORMAL HIGH (ref 0.76–1.27)
EOS (ABSOLUTE): 0.2 10*3/uL (ref 0.0–0.4)
Estimated CHD Risk: 0.7 times avg. (ref 0.0–1.0)
Free Thyroxine Index: 2.5 (ref 1.2–4.9)
GFR calc non Af Amer: 53 mL/min/{1.73_m2} — ABNORMAL LOW (ref >59)
Globulin, Total: 2.1 g/dL (ref 1.5–4.5)
Glucose: 105 mg/dL — ABNORMAL HIGH (ref 65–99)
HDL: 30 mg/dL — ABNORMAL LOW (ref >39)
Hematocrit: 42.7 % (ref 37.5–51.0)
Hemoglobin: 14.6 g/dL (ref 13.0–17.7)
Immature Grans (Abs): 0 10*3/uL (ref 0.0–0.1)
Immature Granulocytes: 0 %
Iron: 116 ug/dL (ref 38–169)
LDH: 221 IU/L (ref 121–224)
LDL Calculated: 48 mg/dL (ref 0–99)
Lymphocytes Absolute: 2.2 10*3/uL (ref 0.7–3.1)
Lymphs: 31 %
MCH: 30.5 pg (ref 26.6–33.0)
MCHC: 34.2 g/dL (ref 31.5–35.7)
Monocytes Absolute: 0.8 10*3/uL (ref 0.1–0.9)
Monocytes: 11 %
Neutrophils Absolute: 3.8 10*3/uL (ref 1.4–7.0)
Neutrophils: 54 %
Phosphorus: 3.3 mg/dL (ref 2.8–4.1)
RBC: 4.79 x10E6/uL (ref 4.14–5.80)
RDW: 13.1 % (ref 11.6–15.4)
Sodium: 141 mmol/L (ref 134–144)
T3 Uptake Ratio: 26 % (ref 24–39)
T4, Total: 9.5 ug/dL (ref 4.5–12.0)
TSH: 1.61 u[IU]/mL (ref 0.450–4.500)
Triglycerides: 182 mg/dL — ABNORMAL HIGH (ref 0–149)
Uric Acid: 9.5 mg/dL — ABNORMAL HIGH (ref 3.7–8.6)
VLDL Cholesterol Cal: 36 mg/dL (ref 5–40)
WBC: 7 10*3/uL (ref 3.4–10.8)

## 2019-03-29 LAB — HEPATITIS B SURFACE ANTIBODY,QUALITATIVE: Hep B S Ab: NONREACTIVE

## 2019-03-30 ENCOUNTER — Ambulatory Visit: Payer: 59 | Admitting: Internal Medicine

## 2019-03-30 ENCOUNTER — Encounter: Payer: Self-pay | Admitting: Internal Medicine

## 2019-03-30 ENCOUNTER — Other Ambulatory Visit: Payer: Self-pay

## 2019-03-30 VITALS — BP 122/72 | HR 81 | Temp 98.9°F | Resp 14 | Ht 70.0 in | Wt 265.0 lb

## 2019-03-30 DIAGNOSIS — E790 Hyperuricemia without signs of inflammatory arthritis and tophaceous disease: Secondary | ICD-10-CM

## 2019-03-30 DIAGNOSIS — N183 Chronic kidney disease, stage 3 unspecified: Secondary | ICD-10-CM

## 2019-03-30 DIAGNOSIS — I10 Essential (primary) hypertension: Secondary | ICD-10-CM

## 2019-03-30 DIAGNOSIS — R8290 Unspecified abnormal findings in urine: Secondary | ICD-10-CM

## 2019-03-30 DIAGNOSIS — E66812 Obesity, class 2: Secondary | ICD-10-CM

## 2019-03-30 DIAGNOSIS — Z8739 Personal history of other diseases of the musculoskeletal system and connective tissue: Secondary | ICD-10-CM

## 2019-03-30 DIAGNOSIS — R7301 Impaired fasting glucose: Secondary | ICD-10-CM

## 2019-03-30 DIAGNOSIS — E7849 Other hyperlipidemia: Secondary | ICD-10-CM

## 2019-03-30 HISTORY — DX: Chronic kidney disease, stage 3 unspecified: N18.30

## 2019-03-30 LAB — POCT URINALYSIS DIPSTICK
Bilirubin, UA: NEGATIVE
Blood, UA: NEGATIVE
Glucose, UA: NEGATIVE
Ketones, UA: NEGATIVE
Leukocytes, UA: NEGATIVE
Nitrite, UA: NEGATIVE
Protein, UA: NEGATIVE
Spec Grav, UA: 1.025 (ref 1.010–1.025)
Urobilinogen, UA: 0.2 E.U./dL
pH, UA: 6 (ref 5.0–8.0)

## 2019-03-30 NOTE — Patient Instructions (Signed)
Gout  Gout is a condition that causes painful swelling of the joints. Gout is a type of inflammation of the joints (arthritis). This condition is caused by having too much uric acid in the body. Uric acid is a chemical that forms when the body breaks down substances called purines. Purines are important for building body proteins. When the body has too much uric acid, sharp crystals can form and build up inside the joints. This causes pain and swelling. Gout attacks can happen quickly and may be very painful (acute gout). Over time, the attacks can affect more joints and become more frequent (chronic gout). Gout can also cause uric acid to build up under the skin and inside the kidneys. What are the causes? This condition is caused by too much uric acid in your blood. This can happen because:  Your kidneys do not remove enough uric acid from your blood. This is the most common cause.  Your body makes too much uric acid. This can happen with some cancers and cancer treatments. It can also occur if your body is breaking down too many red blood cells (hemolytic anemia).  You eat too many foods that are high in purines. These foods include organ meats and some seafood. Alcohol, especially beer, is also high in purines. A gout attack may be triggered by trauma or stress. What increases the risk? You are more likely to develop this condition if you:  Have a family history of gout.  Are male and middle-aged.  Are male and have gone through menopause.  Are obese.  Frequently drink alcohol, especially beer.  Are dehydrated.  Lose weight too quickly.  Have an organ transplant.  Have lead poisoning.  Take certain medicines, including aspirin, cyclosporine, diuretics, levodopa, and niacin.  Have kidney disease.  Have a skin condition called psoriasis. What are the signs or symptoms? An attack of acute gout happens quickly. It usually occurs in just one joint. The most common place is  the big toe. Attacks often start at night. Other joints that may be affected include joints of the feet, ankle, knee, fingers, wrist, or elbow. Symptoms of this condition may include:  Severe pain.  Warmth.  Swelling.  Stiffness.  Tenderness. The affected joint may be very painful to touch.  Shiny, red, or purple skin.  Chills and fever. Chronic gout may cause symptoms more frequently. More joints may be involved. You may also have white or yellow lumps (tophi) on your hands or feet or in other areas near your joints. How is this diagnosed? This condition is diagnosed based on your symptoms, medical history, and physical exam. You may have tests, such as:  Blood tests to measure uric acid levels.  Removal of joint fluid with a thin needle (aspiration) to look for uric acid crystals.  X-rays to look for joint damage. How is this treated? Treatment for this condition has two phases: treating an acute attack and preventing future attacks. Acute gout treatment may include medicines to reduce pain and swelling, including:  NSAIDs.  Steroids. These are strong anti-inflammatory medicines that can be taken by mouth (orally) or injected into a joint.  Colchicine. This medicine relieves pain and swelling when it is taken soon after an attack. It can be given by mouth or through an IV. Preventive treatment may include:  Daily use of smaller doses of NSAIDs or colchicine.  Use of a medicine that reduces uric acid levels in your blood.  Changes to your diet. You may   need to see a dietitian about what to eat and drink to prevent gout. Follow these instructions at home: During a gout attack   If directed, put ice on the affected area: ? Put ice in a plastic bag. ? Place a towel between your skin and the bag. ? Leave the ice on for 20 minutes, 2-3 times a day.  Raise (elevate) the affected joint above the level of your heart as often as possible.  Rest the joint as much as possible.  If the affected joint is in your leg, you may be given crutches to use.  Follow instructions from your health care provider about eating or drinking restrictions. Avoiding future gout attacks  Follow a low-purine diet as told by your dietitian or health care provider. Avoid foods and drinks that are high in purines, including liver, kidney, anchovies, asparagus, herring, mushrooms, mussels, and beer.  Maintain a healthy weight or lose weight if you are overweight. If you want to lose weight, talk with your health care provider. It is important that you do not lose weight too quickly.  Start or maintain an exercise program as told by your health care provider. Eating and drinking  Drink enough fluids to keep your urine pale yellow.  If you drink alcohol: ? Limit how much you use to:  0-1 drink a day for women.  0-2 drinks a day for men. ? Be aware of how much alcohol is in your drink. In the [place] one drink equals one 12 oz bottle of beer (355 mL) one 5 oz glass of wine (148 mL), or one 1 oz glass of hard liquor (44 mL). General instructions  Take over-the-counter and prescription medicines only as told by your health care provider.  Do not drive or use heavy machinery while taking prescription pain medicine.  Return to your normal activities as told by your health care provider. Ask your health care provider what activities are safe for you.  Keep all follow-up visits as told by your health care provider. This is important. Contact a health care provider if you have:  Another gout attack.  Continuing symptoms of a gout attack after 10 days of treatment.  Side effects from your medicines.  Chills or a fever.  Burning pain when you urinate.  Pain in your lower back or belly. Get help right away if you:  Have severe or uncontrolled pain.  Cannot urinate. Summary  Gout is painful swelling of the joints caused by inflammation.  The most common site of pain is the big  toe, but it can affect other joints in the body.  Medicines and dietary changes can help to prevent and treat gout attacks. This information is not intended to replace advice given to you by your health care provider. Make sure you discuss any questions you have with your health care provider. Document Released: 08/21/2000 Document Revised: 03/16/2018 Document Reviewed: 03/16/2018 Elsevier Patient Education  2020 [employer] Inc.  

## 2019-03-30 NOTE — Progress Notes (Signed)
S  - 48 y.o. male who presents for annual physical evaluation  All in all feeling well, some occas LB aches noted, hot weather with work an issue and trying his best to stay better hydrated and drinking more water now  No specific complaints, denies any recent CP, palpitations, SOB, abdominal pains, change in bowel habits, dark/black stools, vision changes, HA's, recent fevers or other concerning sx's for Covid, no dysuria, no frequency or urgency, no LE swelling, no joint swelling No h/o kidney stones Had one episode of gout 15 years ago, not since  Exercise - no regular exercise, used to go to gym to lift weights before closed with Covid, active at work Diet - notes tries to watch and eat healthy, and wife is a good cook, has gained a little weight recent past  Meds reviewed Current Outpatient Medications on File Prior to Visit  Medication Sig Dispense Refill  . benazepril (LOTENSIN) 40 MG tablet TAKE 1 TABLET BY MOUTH EVERY DAY 90 tablet 4  . Loratadine (CLARITIN) 10 MG CAPS Take 1 capsule by mouth daily as needed.    . Omega-3 Fatty Acids (FISH OIL EXTRA STRENGTH) 435 MG CAPS Take by mouth at bedtime.    . simvastatin (ZOCOR) 20 MG tablet TAKE 1 TABLET BY MOUTH EVERY DAY 90 tablet 4   No current facility-administered medications on file prior to visit.     No Known Allergies  Social History   Tobacco Use  Smoking Status Never Smoker  Smokeless Tobacco Never Used    Alcohol use - denies any significant use  FH - patient adopted, brother MI age 53  O - NAD, masked, obese BP 122/72 (BP Location: Right Arm, Patient Position: Sitting, Cuff Size: Large)   Pulse 81   Temp 98.9 F (37.2 C) (Oral)   Resp 14   Ht 5\' 10"  (1.778 m)   Wt 265 lb (120.2 kg)   SpO2 96%   BMI 38.02 kg/m   weight 259.8 - 12/2017 HEENT - sclera anicteric, PERRL, EOMI, conj - non-inj'ed, no focal sinus tenderness, TM's and canals clear Neck - supple, no adenopathy, no TM, carotids 2+ and = without  bruits Car - RRR without m/g/r Pulm- CTA without wheeze or rales Abd - soft, obese, NT, ND, BS+, no obvious HSM, no masses Back - no CVA tenderness Ext - no LE edema, no active joints GU - no swelling in inguinal/suprapubic region, NT, testicles without lumps, no ing hernia on exam, prostate NT and no nodules, 1+ (not significantly enlarged), no fluctuance Neuro - affect was not flat, appropriate with conversation  Grossly non-focal with good strength on testing, sensation intact to LT in distal extremities, Romberg neg, no pronator drift, good balance on one foot, good finger to nose, good RAMs   Labs reviewed - of note,  increased Cr, glucose - 105, uric acid - 9.5, TG -182, HDL - 30, PSA - 1.2 Repeat urine dip today in office - negative (trace leuk's noted on first check a week ago) ECG reviewed - no concerning changes from prior ECG Colonoscopy screening discussed and reviewed - rec'ed at age 50 for screening (turns 56 this Dec)  Ass/Plan: 1. HTN - controlled on medicines  Continue medication to manage Continue to monitor  2.  Hyperlipidemia  statin to continue Importance of diet modifications and some regular aerobic exercise encouraged Above to help with weight control/weight loss also very important   3. A/P - Increased BMI/obesity  Importance of diet modifications  and some regular aerobic exercise emphasized with lifestyle changes adopted for the long term very important for success with weight control and some weight loss (he noted was about 280 not long ago and had been successful losing weight, lost about 25 lbs before this recent increase) - encouraged with efforts to try to lose again.  4. CKD concern - increased Creatine/GFR from one year ago  Educated, many entities in differential with question of heat and hydration playing a role questioned, no h/o kidney stones but in differential with hyperuricemia, many other sources in differential as well. Takes no  supplements. Will recheck BMP and urine in 4-6 weeks with further plans pending that re-assessment and stay well hydrated importance noted  5. Hyperuricemia - one episode of gout in history about 15 years ago, no kidney stone history clinically  Information on gout included in his AVS to help with diet modifications and staying hydrated importance noted  6. IFG - glucose 105 last couple checks  Educated about this/prediabetes and importance of above lifestyle recs to help  7. Leukocytes on urine dip - trace,  No sx's of concern and prostate normal on check today  Recheck urine dip in office today - negative    8. Brother with early HD in [place] - discussed this as part of cardiac risk profile  9. Screening - due for colonoscopy when turns 33, can do in early 2021 rec'ed and he noted he will do so

## 2019-05-02 ENCOUNTER — Other Ambulatory Visit: Payer: 59

## 2019-05-02 ENCOUNTER — Other Ambulatory Visit: Payer: Self-pay

## 2019-05-02 NOTE — Addendum Note (Signed)
Addended by: Virgilio Frees on: 05/02/2019 08:11 AM   Modules accepted: Orders

## 2019-05-03 ENCOUNTER — Other Ambulatory Visit: Payer: Self-pay | Admitting: Family Medicine

## 2019-05-03 DIAGNOSIS — E7800 Pure hypercholesterolemia, unspecified: Secondary | ICD-10-CM

## 2019-05-03 LAB — BASIC METABOLIC PANEL
BUN/Creatinine Ratio: 12 (ref 9–20)
BUN: 13 mg/dL (ref 6–24)
CO2: 22 mmol/L (ref 20–29)
Calcium: 9.4 mg/dL (ref 8.7–10.2)
Chloride: 106 mmol/L (ref 96–106)
Creatinine, Ser: 1.05 mg/dL (ref 0.76–1.27)
GFR calc Af Amer: 96 mL/min/{1.73_m2} (ref >59)
GFR calc non Af Amer: 83 mL/min/{1.73_m2} (ref >59)
Glucose: 106 mg/dL — ABNORMAL HIGH (ref 65–99)
Potassium: 4.3 mmol/L (ref 3.5–5.2)
Sodium: 143 mmol/L (ref 134–144)

## 2019-05-03 LAB — URINALYSIS, ROUTINE W REFLEX MICROSCOPIC
Bilirubin, UA: NEGATIVE
Glucose, UA: NEGATIVE
Ketones, UA: NEGATIVE
Leukocytes,UA: NEGATIVE
Nitrite, UA: NEGATIVE
Protein,UA: NEGATIVE
RBC, UA: NEGATIVE
Specific Gravity, UA: 1.025 (ref 1.005–1.030)
Urobilinogen, Ur: 0.2 mg/dL (ref 0.2–1.0)
pH, UA: 5 (ref 5.0–7.5)

## 2019-05-03 NOTE — Telephone Encounter (Signed)
Routing to new provider

## 2019-05-03 NOTE — Telephone Encounter (Signed)
Requested medication (s) are due for refill today: yes  Requested medication (s) are on the active medication list: yes  Last refill:  02/04/2018  Future visit scheduled: no  Notes to clinic: per protocol unable to refill   Requested Prescriptions  Pending Prescriptions Disp Refills   simvastatin (ZOCOR) 20 MG tablet [Pharmacy Med Name: SIMVASTATIN 20 MG TABLET] 90 tablet 4    Sig: TAKE 1 TABLET BY MOUTH EVERY DAY     Cardiovascular:  Antilipid - Statins Failed - 05/03/2019  1:29 AM      Failed - HDL in normal range and within 360 days    HDL  Date Value Ref Range Status  03/22/2019 30 (L) >39 mg/dL Final         Failed - Triglycerides in normal range and within 360 days    Triglycerides  Date Value Ref Range Status  03/22/2019 182 (H) 0 - 149 mg/dL Final   Triglycerides Piccolo,Waived  Date Value Ref Range Status  03/16/2017 317 (H) <150 mg/dL Final    Comment:                            Normal                   <150                         Borderline High     150 - 199                         High                200 - 499                         Very High                >499          Failed - Valid encounter within last 12 months    Recent Outpatient Visits          1 year ago PE (physical exam), annual   [facility], Jeannette How, MD   2 years ago Essential hypertension   [place], Jeannette How, MD   2 years ago Need for influenza vaccination   [facility], Jeannette How, MD   3 years ago Hyperlipidemia   [facility], Jeannette How, MD   3 years ago Essential hypertension   [place], Jeannette How, MD             Passed - Total Cholesterol in normal range and within 360 days    Cholesterol, Total  Date Value Ref Range Status  03/22/2019 114 100 - 199 mg/dL Final   Cholesterol Piccolo, Waived  Date Value Ref Range Status  03/16/2017 139 <200 mg/dL Final    Comment:                          Desirable                <200                         Borderline High      200- 239  High                     >239          Passed - LDL in normal range and within 360 days    LDL Calculated  Date Value Ref Range Status  03/22/2019 48 0 - 99 mg/dL Final         Passed - Patient is not pregnant

## 2019-05-10 ENCOUNTER — Other Ambulatory Visit: Payer: Self-pay

## 2019-05-10 ENCOUNTER — Encounter: Payer: Self-pay | Admitting: Internal Medicine

## 2019-05-10 ENCOUNTER — Ambulatory Visit: Payer: 59 | Admitting: Internal Medicine

## 2019-05-10 VITALS — BP 133/77 | HR 70 | Temp 97.6°F | Resp 16 | Ht 70.0 in | Wt 268.0 lb

## 2019-05-10 DIAGNOSIS — I10 Essential (primary) hypertension: Secondary | ICD-10-CM

## 2019-05-10 DIAGNOSIS — N183 Chronic kidney disease, stage 3 unspecified: Secondary | ICD-10-CM

## 2019-05-10 DIAGNOSIS — R7301 Impaired fasting glucose: Secondary | ICD-10-CM

## 2019-05-10 DIAGNOSIS — E790 Hyperuricemia without signs of inflammatory arthritis and tophaceous disease: Secondary | ICD-10-CM

## 2019-05-10 NOTE — Progress Notes (Signed)
S  - 48 y.o. male who presents for f/u after his annual physical evaluation showed a higher BUN/Cr and concerns for CKD. Questioned if hydration playing a role with the warmer weather and his activities.  Remains feeling well, some occas LB aches noted as prior, not increased and not higher up but LB area  No recent CP, no recent fevers or other concerning sx's for Covid, no dysuria, no frequency or urgency although questions if he does go a little more often than his peers he works with. no LE swelling, no gout concerns since last visit No h/o kidney stones Had one episode of gout 15 years ago, not since  Exercise - no regular exercise still, used to go to gym to lift weights before closed with Covid, remains active at work Diet - notes tries to watch and eat healthy, but more gatorade recent past and that is higher in sugars noted, had gained a little weight recent past  No Known Allergies Current Outpatient Medications on File Prior to Visit  Medication Sig Dispense Refill  . benazepril (LOTENSIN) 40 MG tablet TAKE 1 TABLET BY MOUTH EVERY DAY 90 tablet 4  . Loratadine (CLARITIN) 10 MG CAPS Take 1 capsule by mouth daily as needed.    . Omega-3 Fatty Acids (FISH OIL EXTRA STRENGTH) 435 MG CAPS Take by mouth at bedtime.    . simvastatin (ZOCOR) 20 MG tablet TAKE 1 TABLET BY MOUTH EVERY DAY 90 tablet 4   No current facility-administered medications on file prior to visit.     Never smoker Alcohol use - denies any significant use  O - NAD, masked, obese  BP 133/77 (BP Location: Right Arm, Patient Position: Sitting, Cuff Size: Large)   Pulse 70   Temp 97.6 F (36.4 C) (Oral)   Resp 16   Ht 5\' 10"  (1.778 m)   Wt 268 lb (121.6 kg)   SpO2 96%   BMI 38.45 kg/m '  Weight 265 last visit  weight 259.8 - 12/2017  HEENT - sclera anicteric Car - RRR without m/g/r Pulm- CTA  Abd - obese, Back - no CVA tenderness Ext - no LE edema GU - on last visit -  prostate NT and no nodules,  1+ (not significantly enlarged), no fluctuance, not repeated today Neuro - affect was not flat, appropriate with conversation             Labs reviewed - Bun/Cr - 13/1.05, glucose - 106, urine neg  Ass/Plan: 1. HTN - remains controlled on medicines  Continue medication to manage Continue to monitor  2.  Increased BMI/obesity  Importance of diet modifications and some regular aerobic exercise emphasized. Encouraged with efforts to try to lose again.  3. CKD concern - increased Creatine/GFR last visit and recheck much improved (question if decreased hydration was an issue prior)  Educated and normal on recent check, no further work-up presently felt needed Follow at present and stay well hydrated rec'ed  4. Hyperuricemia - one episode of gout in history about 15 years ago, no kidney stone history clinically  Information on gout included in his AVS last visit to help with diet modifications and staying hydrated importance noted Not feel adding allopurinol needed at present and discussed this option, emphasized diet today with foods low in purines best and reviewed.   5. IFG - glucose 106 and 105 last couple checks prior  Educated about this/prediabetes and importance of above lifestyle recs to help again today

## 2019-11-23 NOTE — Progress Notes (Deleted)
Patient comes in today for pre physical labs and EKG. Patient is scheduled with Gerarda Fraction, PA-C

## 2019-11-27 NOTE — Progress Notes (Signed)
Scheduled to complete physical 12/06/2019 with Gerarda Fraction, NP-C.  AMD

## 2019-11-28 ENCOUNTER — Other Ambulatory Visit: Payer: Self-pay

## 2019-11-28 ENCOUNTER — Ambulatory Visit: Payer: Self-pay

## 2019-11-28 DIAGNOSIS — Z0000 Encounter for general adult medical examination without abnormal findings: Secondary | ICD-10-CM

## 2019-11-28 LAB — POCT URINALYSIS DIPSTICK
Bilirubin, UA: NEGATIVE
Blood, UA: NEGATIVE
Glucose, UA: NEGATIVE
Ketones, UA: NEGATIVE
Leukocytes, UA: NEGATIVE
Nitrite, UA: NEGATIVE
Protein, UA: NEGATIVE
Spec Grav, UA: >=1.03 — AB (ref 1.010–1.025)
Urobilinogen, UA: 0.2 E.U./dL
pH, UA: 5.5 (ref 5.0–8.0)

## 2019-11-29 LAB — CMP12+LP+TP+TSH+6AC+PSA+CBC…
ALT: 38 IU/L (ref 0–44)
AST: 29 IU/L (ref 0–40)
Albumin/Globulin Ratio: 1.8 (ref 1.2–2.2)
Albumin: 4.7 g/dL (ref 4.0–5.0)
Alkaline Phosphatase: 79 IU/L (ref 39–117)
BUN/Creatinine Ratio: 11 (ref 9–20)
BUN: 14 mg/dL (ref 6–24)
Basophils Absolute: 0.1 10*3/uL (ref 0.0–0.2)
Basos: 1 %
Bilirubin Total: 0.6 mg/dL (ref 0.0–1.2)
Calcium: 9.5 mg/dL (ref 8.7–10.2)
Chloride: 105 mmol/L (ref 96–106)
Chol/HDL Ratio: 3.8 ratio (ref 0.0–5.0)
Cholesterol, Total: 123 mg/dL (ref 100–199)
Creatinine, Ser: 1.23 mg/dL (ref 0.76–1.27)
EOS (ABSOLUTE): 0.2 10*3/uL (ref 0.0–0.4)
Eos: 2 %
Estimated CHD Risk: 0.7 times avg. (ref 0.0–1.0)
Free Thyroxine Index: 2 (ref 1.2–4.9)
GFR calc Af Amer: 79 mL/min/{1.73_m2} (ref >59)
GFR calc non Af Amer: 68 mL/min/{1.73_m2} (ref >59)
GGT: 24 IU/L (ref 0–65)
Globulin, Total: 2.6 g/dL (ref 1.5–4.5)
Glucose: 107 mg/dL — ABNORMAL HIGH (ref 65–99)
HDL: 32 mg/dL — ABNORMAL LOW (ref >39)
Hematocrit: 45.4 % (ref 37.5–51.0)
Hemoglobin: 15.4 g/dL (ref 13.0–17.7)
Immature Grans (Abs): 0 10*3/uL (ref 0.0–0.1)
Immature Granulocytes: 0 %
Iron: 103 ug/dL (ref 38–169)
LDH: 194 IU/L (ref 121–224)
LDL Chol Calc (NIH): 55 mg/dL (ref 0–99)
Lymphocytes Absolute: 2.1 10*3/uL (ref 0.7–3.1)
Lymphs: 30 %
MCH: 30.2 pg (ref 26.6–33.0)
MCHC: 33.9 g/dL (ref 31.5–35.7)
MCV: 89 fL (ref 79–97)
Monocytes Absolute: 0.6 10*3/uL (ref 0.1–0.9)
Monocytes: 9 %
Neutrophils Absolute: 3.9 10*3/uL (ref 1.4–7.0)
Neutrophils: 58 %
Phosphorus: 3.3 mg/dL (ref 2.8–4.1)
Platelets: 185 10*3/uL (ref 150–450)
Potassium: 5.1 mmol/L (ref 3.5–5.2)
Prostate Specific Ag, Serum: 2 ng/mL (ref 0.0–4.0)
RBC: 5.1 x10E6/uL (ref 4.14–5.80)
RDW: 13.2 % (ref 11.6–15.4)
Sodium: 143 mmol/L (ref 134–144)
T3 Uptake Ratio: 25 % (ref 24–39)
T4, Total: 8 ug/dL (ref 4.5–12.0)
TSH: 1.99 u[IU]/mL (ref 0.450–4.500)
Total Protein: 7.3 g/dL (ref 6.0–8.5)
Triglycerides: 225 mg/dL — ABNORMAL HIGH (ref 0–149)
Uric Acid: 7.3 mg/dL (ref 3.8–8.4)
VLDL Cholesterol Cal: 36 mg/dL (ref 5–40)
WBC: 6.8 10*3/uL (ref 3.4–10.8)

## 2019-12-01 LAB — HGB A1C W/O EAG: Hgb A1c MFr Bld: 5.6 % (ref 4.8–5.6)

## 2019-12-01 LAB — SPECIMEN STATUS REPORT

## 2019-12-06 ENCOUNTER — Encounter: Payer: Self-pay | Admitting: Registered Nurse

## 2019-12-06 ENCOUNTER — Ambulatory Visit: Payer: Self-pay | Admitting: Registered Nurse

## 2019-12-06 ENCOUNTER — Other Ambulatory Visit: Payer: Self-pay

## 2019-12-06 VITALS — BP 130/70 | HR 79 | Temp 98.2°F | Resp 12 | Ht 70.0 in | Wt 281.0 lb

## 2019-12-06 DIAGNOSIS — Z024 Encounter for examination for driving license: Secondary | ICD-10-CM

## 2019-12-06 DIAGNOSIS — I10 Essential (primary) hypertension: Secondary | ICD-10-CM

## 2019-12-06 DIAGNOSIS — Z8589 Personal history of malignant neoplasm of other organs and systems: Secondary | ICD-10-CM

## 2019-12-06 DIAGNOSIS — E785 Hyperlipidemia, unspecified: Secondary | ICD-10-CM

## 2019-12-06 DIAGNOSIS — R7309 Other abnormal glucose: Secondary | ICD-10-CM

## 2019-12-06 NOTE — Progress Notes (Signed)
Labs & EKG completed 11/23/19.  AMD
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
## 2019-12-06 NOTE — Patient Instructions (Signed)
Preventing Skin Cancer, Adult Skin cancer is the most common type of cancer. There are three main types. Squamous cell and basal cell skin cancer are the most common. Melanoma skin cancer is the most dangerous type. Most skin cancers are caused by skin damage from exposure to ultraviolet (UV) light. UV light comes from the sun and from artificial tanning beds. Suntans and sunburns result from exposure to UV light. Skin cancer occurs most often in older people, but it is usually the result of damage done earlier in life. The tans and sunburns you get at any age can lead to skin cancer in the future. To help prevent this, you can take steps to protect yourself. What actions can I take to protect myself from skin cancer? Many people like to get a tan, especially in the summer or when on vacation. However, tan or burned skin is a sign of skin damage. It increases your risk for skin cancer. To lower your risk: Avoid exposure to UV light   Try to stay out of the sun between 10 a.m. and 4 p.m. whenever possible. This is when the sun is at its strongest. Seek the shade during this time.  Remember that you can also be exposed to UV rays on cloudy or hazy days. Sun exposure can be risky year-round, not just in the summer.  Do not use a sunlamp, tanning bed, or tanning booth to get a tan. If you really want a tan, use an artificial tanning lotion.  Avoid getting sunburned. Sunburns are more common on bright sunny days, especially when you are in areas where the sun is reflected off water or snow. Use sunscreen and protective clothing   Always use sunscreen--either a cream, lotion, or spray--when you are out in the sun. Keep sunscreen handy, such as in your gym bag or in your car, so that you will have it when you need it.  Use a sunscreen with a sun protection factor (SPF) of at least 15. Use an SPF of 30 or higher if you are in bright sun, especially when you are out in the snow or on the water.  Make  sure your sunscreen protects you from UVA and UVB light.  Use an adequate amount of sunscreen to cover exposed areas of skin. Put it on 30 minutes before you go out. Reapply it every 2 hours or anytime you come out of the water.  When you are out in the sun, wear a broad-brimmed hat and clothing that covers your arms and legs. Wear wraparound sunglasses. Check your skin for changes  Check your skin often from head to toe to look for any changes in the size, color, or shape of any moles or freckles. Check for any new moles or moles that bleed or become itchy. See your health care provider if you notice changes.  Ask your health care provider about a total skin check. Ask if it should be part of your yearly physical or if you need to see a skin specialist (dermatologist). Take other preventive measures   Avoid exposure to harmful chemicals, such as arsenic. ? Have your home's water tested for arsenic and other chemicals. ? Take protective measures to avoid exposure to chemicals at work.  Do not smoke any tobacco products, such as cigarettes, cigars, pipes, and e-cigarettes. If you need help quitting, ask your health care provider.  Keep your immune system healthy. ? Stay up to date on all vaccines, including the human papillomavirus (HPV) vaccine. ?   Eat at least 5 servings of fruits and vegetables every day. Why are these changes important? About 1 of every 5 people will get skin cancer. The best way to reduce your risk is to avoid skin damage from UV light. If you have teenagers in your house, they should know that just five bad sunburns as a teen could double their risk of skin cancer in the future. If you have younger children, always make sure to protect their skin from the sun. These changes can help reduce your risk of skin cancer, and they will also provide other health benefits, such as the following:  Protecting your skin from the sun can help prevent painful sunburns, sun poisoning,  and other skin damage and blemishes. This is especially important if: ? You have pale white skin, freckles, and red hair. ? You burn easily.  Avoiding exposure to harmful chemicals can help prevent damage to other tissues in your body, such as your lungs, and prevent other types of cancer.  Avoiding smoking tobacco can reduce your risk for other types of cancer and other health problems.  Eating a healthy diet is good for your overall health. What can happen if changes are not made? If you do not make these changes, you will be at higher risk for skin cancer. If you develop skin cancer, the treatments could result in lost time from work and changes in your appearance from scars. The most dangerous type of skin cancer, melanoma, can be deadly if not found early. Where to find support For more support, talk to your primary health care provider or dermatologist. Where to find more information Learn more about skin cancer from:  The [place]: www.skincancer.org/prevention  The Centers for Disease Control and Prevention: FabVets.se  The American Academy of Dermatology: http://jones-macias.info/ Summary  Skin cancer is the most common type of cancer.  Melanoma skin cancer can be deadly if not found early.  Sunburns and tanning increase your risk for skin cancer.  Protecting your skin from UV light is the best way to prevent skin cancer. This information is not intended to replace advice given to you by your health care provider. Make sure you discuss any questions you have with your health care provider. Document Revised: 12/16/2018 Document Reviewed: 10/18/2017 Elsevier Patient Education  [place]. High-Fiber Diet Fiber, also called dietary fiber, is a type of carbohydrate that is found in fruits, vegetables, whole grains, and beans. A high-fiber diet can have many health benefits. Your health care provider may recommend a high-fiber diet to help:  Prevent  constipation. Fiber can make your bowel movements more regular.  Lower your cholesterol.  Relieve the following conditions: ? Swelling of veins in the anus (hemorrhoids). ? Swelling and irritation (inflammation) of specific areas of the digestive tract (uncomplicated diverticulosis). ? A problem of the large intestine (colon) that sometimes causes pain and diarrhea (irritable bowel syndrome, IBS).  Prevent overeating as part of a weight-loss plan.  Prevent heart disease, type 2 diabetes, and certain cancers. What is my plan? The recommended daily fiber intake in grams (g) includes:  38 g for men age 45 or younger.  30 g for men over age 54.  28 g for women age 32 or younger.  21 g for women over age 35. You can get the recommended daily intake of dietary fiber by:  Eating a variety of fruits, vegetables, grains, and beans.  Taking a fiber supplement, if it is not possible to get enough  fiber through your diet. What do I need to know about a high-fiber diet?  It is better to get fiber through food sources rather than from fiber supplements. There is not a lot of research about how effective supplements are.  Always check the fiber content on the nutrition facts label of any prepackaged food. Look for foods that contain 5 g of fiber or more per serving.  Talk with a diet and nutrition specialist (dietitian) if you have questions about specific foods that are recommended or not recommended for your medical condition, especially if those foods are not listed below.  Gradually increase how much fiber you consume. If you increase your intake of dietary fiber too quickly, you may have bloating, cramping, or gas.  Drink plenty of water. Water helps you to digest fiber. What are tips for following this plan?  Eat a wide variety of high-fiber foods.  Make sure that half of the grains that you eat each day are whole grains.  Eat breads and cereals that are made with whole-grain flour  instead of refined flour or white flour.  Eat brown rice, bulgur wheat, or millet instead of white rice.  Start the day with a breakfast that is high in fiber, such as a cereal that contains 5 g of fiber or more per serving.  Use beans in place of meat in soups, salads, and pasta dishes.  Eat high-fiber snacks, such as berries, raw vegetables, nuts, and popcorn.  Choose whole fruits and vegetables instead of processed forms like juice or sauce. What foods can I eat?  Fruits Berries. Pears. Apples. Oranges. Avocado. Prunes and raisins. Dried figs. Vegetables Sweet potatoes. Spinach. Kale. Artichokes. Cabbage. Broccoli. Cauliflower. Green peas. Carrots. Squash. Grains Whole-grain breads. Multigrain cereal. Oats and oatmeal. Brown rice. Barley. Bulgur wheat. [place]. Quinoa. Bran muffins. Popcorn. Rye wafer crackers. Meats and other proteins Navy, kidney, and pinto beans. Soybeans. Split peas. Lentils. Nuts and seeds. Dairy Fiber-fortified yogurt. Beverages Fiber-fortified soy milk. Fiber-fortified orange juice. Other foods Fiber bars. The items listed above may not be a complete list of recommended foods and beverages. Contact a dietitian for more options. What foods are not recommended? Fruits Fruit juice. Cooked, strained fruit. Vegetables Fried potatoes. Canned vegetables. Well-cooked vegetables. Grains White bread. Pasta made with refined flour. White rice. Meats and other proteins Fatty cuts of meat. Fried chicken or fried fish. Dairy Milk. Yogurt. Cream cheese. Sour cream. Fats and oils Butters. Beverages Soft drinks. Other foods Cakes and pastries. The items listed above may not be a complete list of foods and beverages to avoid. Contact a dietitian for more information. Summary  Fiber is a type of carbohydrate. It is found in fruits, vegetables, whole grains, and beans.  There are many health benefits of eating a high-fiber diet, such as preventing constipation,  lowering blood cholesterol, helping with weight loss, and reducing your risk of heart disease, diabetes, and certain cancers.  Gradually increase your intake of fiber. Increasing too fast can result in cramping, bloating, and gas. Drink plenty of water while you increase your fiber.  The best sources of fiber include whole fruits and vegetables, whole grains, nuts, seeds, and beans. This information is not intended to replace advice given to you by your health care provider. Make sure you discuss any questions you have with your health care provider. Document Revised: 06/28/2017 Document Reviewed: 06/28/2017 Elsevier Patient Education  [place] Eating Plan DASH stands for "Dietary Approaches to Stop Hypertension." The DASH  eating plan is a healthy eating plan that has been shown to reduce high blood pressure (hypertension). It may also reduce your risk for type 2 diabetes, heart disease, and stroke. The DASH eating plan may also help with weight loss. What are tips for following this plan?  General guidelines  Avoid eating more than 2,300 mg (milligrams) of salt (sodium) a day. If you have hypertension, you may need to reduce your sodium intake to 1,500 mg a day.  Limit alcohol intake to no more than 1 drink a day for nonpregnant women and 2 drinks a day for men. One drink equals 12 oz of beer, 5 oz of wine, or 1 oz of hard liquor.  Work with your health care provider to maintain a healthy body weight or to lose weight. Ask what an ideal weight is for you.  Get at least 30 minutes of exercise that causes your heart to beat faster (aerobic exercise) most days of the week. Activities may include walking, swimming, or biking.  Work with your health care provider or diet and nutrition specialist (dietitian) to adjust your eating plan to your individual calorie needs. Reading food labels   Check food labels for the amount of sodium per serving. Choose foods with less than 5  percent of the Daily Value of sodium. Generally, foods with less than 300 mg of sodium per serving fit into this eating plan.  To find whole grains, look for the word "whole" as the first word in the ingredient list. Shopping  Buy products labeled as "low-sodium" or "no salt added."  Buy fresh foods. Avoid canned foods and premade or frozen meals. Cooking  Avoid adding salt when cooking. Use salt-free seasonings or herbs instead of table salt or sea salt. Check with your health care provider or pharmacist before using salt substitutes.  Do not fry foods. Cook foods using healthy methods such as baking, boiling, grilling, and broiling instead.  Cook with heart-healthy oils, such as olive, canola, soybean, or sunflower oil. Meal planning  Eat a balanced diet that includes: ? 5 or more servings of fruits and vegetables each day. At each meal, try to fill half of your plate with fruits and vegetables. ? Up to 6-8 servings of whole grains each day. ? Less than 6 oz of lean meat, poultry, or fish each day. A 3-oz serving of meat is about the same size as a deck of cards. One egg equals 1 oz. ? 2 servings of low-fat dairy each day. ? A serving of nuts, seeds, or beans 5 times each week. ? Heart-healthy fats. Healthy fats called Omega-3 fatty acids are found in foods such as flaxseeds and coldwater fish, like sardines, salmon, and mackerel.  Limit how much you eat of the following: ? Canned or prepackaged foods. ? Food that is high in trans fat, such as fried foods. ? Food that is high in saturated fat, such as fatty meat. ? Sweets, desserts, sugary drinks, and other foods with added sugar. ? Full-fat dairy products.  Do not salt foods before eating.  Try to eat at least 2 vegetarian meals each week.  Eat more home-cooked food and less restaurant, buffet, and fast food.  When eating at a restaurant, ask that your food be prepared with less salt or no salt, if possible. What foods are  recommended? The items listed may not be a complete list. Talk with your dietitian about what dietary choices are best for you. Grains Whole-grain or whole-wheat bread.  Whole-grain or whole-wheat pasta. Brown rice. [place]. Bulgur. Whole-grain and low-sodium cereals. Pita bread. Low-fat, low-sodium crackers. Whole-wheat flour tortillas. Vegetables Fresh or frozen vegetables (raw, steamed, roasted, or grilled). Low-sodium or reduced-sodium tomato and vegetable juice. Low-sodium or reduced-sodium tomato sauce and tomato paste. Low-sodium or reduced-sodium canned vegetables. Fruits All fresh, dried, or frozen fruit. Canned fruit in natural juice (without added sugar). Meat and other protein foods Skinless chicken or [place]. Ground chicken or [place]. Pork with fat trimmed off. Fish and seafood. Egg whites. Dried beans, peas, or lentils. Unsalted nuts, nut butters, and seeds. Unsalted canned beans. Lean cuts of beef with fat trimmed off. Low-sodium, lean deli meat. Dairy Low-fat (1%) or fat-free (skim) milk. Fat-free, low-fat, or reduced-fat cheeses. Nonfat, low-sodium ricotta or cottage cheese. Low-fat or nonfat yogurt. Low-fat, low-sodium cheese. Fats and oils Soft margarine without trans fats. Vegetable oil. Low-fat, reduced-fat, or light mayonnaise and salad dressings (reduced-sodium). Canola, safflower, olive, soybean, and sunflower oils. Avocado. Seasoning and other foods Herbs. Spices. Seasoning mixes without salt. Unsalted popcorn and pretzels. Fat-free sweets. What foods are not recommended? The items listed may not be a complete list. Talk with your dietitian about what dietary choices are best for you. Grains Baked goods made with fat, such as croissants, muffins, or some breads. Dry pasta or rice meal packs. Vegetables Creamed or fried vegetables. Vegetables in a cheese sauce. Regular canned vegetables (not low-sodium or reduced-sodium). Regular canned tomato sauce and paste (not  low-sodium or reduced-sodium). Regular tomato and vegetable juice (not low-sodium or reduced-sodium). Angie Fava. Olives. Fruits Canned fruit in a light or heavy syrup. Fried fruit. Fruit in cream or butter sauce. Meat and other protein foods Fatty cuts of meat. Ribs. Fried meat. Berniece Salines. Sausage. Bologna and other processed lunch meats. Salami. Fatback. Hotdogs. Bratwurst. Salted nuts and seeds. Canned beans with added salt. Canned or smoked fish. Whole eggs or egg yolks. Chicken or [place] with skin. Dairy Whole or 2% milk, cream, and half-and-half. Whole or full-fat cream cheese. Whole-fat or sweetened yogurt. Full-fat cheese. Nondairy creamers. Whipped toppings. Processed cheese and cheese spreads. Fats and oils Butter. Stick margarine. Lard. Shortening. Ghee. Bacon fat. Tropical oils, such as coconut, palm kernel, or palm oil. Seasoning and other foods Salted popcorn and pretzels. Onion salt, garlic salt, seasoned salt, table salt, and sea salt. Worcestershire sauce. Tartar sauce. Barbecue sauce. Teriyaki sauce. Soy sauce, including reduced-sodium. Steak sauce. Canned and packaged gravies. Fish sauce. Oyster sauce. Cocktail sauce. Horseradish that you find on the shelf. Ketchup. Mustard. Meat flavorings and tenderizers. Bouillon cubes. Hot sauce and Tabasco sauce. Premade or packaged marinades. Premade or packaged taco seasonings. Relishes. Regular salad dressings. Where to find more information:  National Heart, Lung, and [place]: https://wilson-eaton.com/  American Heart Association: www.heart.org Summary  The DASH eating plan is a healthy eating plan that has been shown to reduce high blood pressure (hypertension). It may also reduce your risk for type 2 diabetes, heart disease, and stroke.  With the DASH eating plan, you should limit salt (sodium) intake to 2,300 mg a day. If you have hypertension, you may need to reduce your sodium intake to 1,500 mg a day.  When on the DASH eating plan,  aim to eat more fresh fruits and vegetables, whole grains, lean proteins, low-fat dairy, and heart-healthy fats.  Work with your health care provider or diet and nutrition specialist (dietitian) to adjust your eating plan to your individual calorie needs. This information is not intended to replace advice given  to you by your health care provider. Make sure you discuss any questions you have with your health care provider. Document Revised: 08/06/2017 Document Reviewed: 08/17/2016 Elsevier Patient Education  2020 [employer]. Managing Your Hypertension Hypertension is commonly called high blood pressure. This is when the force of your blood pressing against the walls of your arteries is too strong. Arteries are blood vessels that carry blood from your heart throughout your body. Hypertension forces the heart to work harder to pump blood, and may cause the arteries to become narrow or stiff. Having untreated or uncontrolled hypertension can cause heart attack, stroke, kidney disease, and other problems. What are blood pressure readings? A blood pressure reading consists of a higher number over a lower number. Ideally, your blood pressure should be below 120/80. The first ("top") number is called the systolic pressure. It is a measure of the pressure in your arteries as your heart beats. The second ("bottom") number is called the diastolic pressure. It is a measure of the pressure in your arteries as the heart relaxes. What does my blood pressure reading mean? Blood pressure is classified into four stages. Based on your blood pressure reading, your health care provider may use the following stages to determine what type of treatment you need, if any. Systolic pressure and diastolic pressure are measured in a unit called mm Hg. Normal  Systolic pressure: below 123456.  Diastolic pressure: below 80. Elevated  Systolic pressure: [id number].  Diastolic pressure: below 80. Hypertension stage 1  Systolic  pressure: 0000000.  Diastolic pressure: [id number]. Hypertension stage 2  Systolic pressure: [id number] or above.  Diastolic pressure: 90 or above. What health risks are associated with hypertension? Managing your hypertension is an important responsibility. Uncontrolled hypertension can lead to:  A heart attack.  A stroke.  A weakened blood vessel (aneurysm).  Heart failure.  Kidney damage.  Eye damage.  Metabolic syndrome.  Memory and concentration problems. What changes can I make to manage my hypertension? Hypertension can be managed by making lifestyle changes and possibly by taking medicines. Your health care provider will help you make a plan to bring your blood pressure within a normal range. Eating and drinking   Eat a diet that is high in fiber and potassium, and low in salt (sodium), added sugar, and fat. An example eating plan is called the DASH (Dietary Approaches to Stop Hypertension) diet. To eat this way: ? Eat plenty of fresh fruits and vegetables. Try to fill half of your plate at each meal with fruits and vegetables. ? Eat whole grains, such as whole wheat pasta, brown rice, or whole grain bread. Fill about one quarter of your plate with whole grains. ? Eat low-fat diary products. ? Avoid fatty cuts of meat, processed or cured meats, and poultry with skin. Fill about one quarter of your plate with lean proteins such as fish, chicken without skin, beans, eggs, and tofu. ? Avoid premade and processed foods. These tend to be higher in sodium, added sugar, and fat.  Reduce your daily sodium intake. Most people with hypertension should eat less than 1,500 mg of sodium a day.  Limit alcohol intake to no more than 1 drink a day for nonpregnant women and 2 drinks a day for men. One drink equals 12 oz of beer, 5 oz of wine, or 1 oz of hard liquor. Lifestyle  Work with your health care provider to maintain a healthy body weight, or to lose weight. Ask what an ideal weight is  for you.  Get at least 30 minutes of exercise that causes your heart to beat faster (aerobic exercise) most days of the week. Activities may include walking, swimming, or biking.  Include exercise to strengthen your muscles (resistance exercise), such as weight lifting, as part of your weekly exercise routine. Try to do these types of exercises for 30 minutes at least 3 days a week.  Do not use any products that contain nicotine or tobacco, such as cigarettes and e-cigarettes. If you need help quitting, ask your health care provider.  Control any long-term (chronic) conditions you have, such as high cholesterol or diabetes. Monitoring  Monitor your blood pressure at home as told by your health care provider. Your personal target blood pressure may vary depending on your medical conditions, your age, and other factors.  Have your blood pressure checked regularly, as often as told by your health care provider. Working with your health care provider  Review all the medicines you take with your health care provider because there may be side effects or interactions.  Talk with your health care provider about your diet, exercise habits, and other lifestyle factors that may be contributing to hypertension.  Visit your health care provider regularly. Your health care provider can help you create and adjust your plan for managing hypertension. Will I need medicine to control my blood pressure? Your health care provider may prescribe medicine if lifestyle changes are not enough to get your blood pressure under control, and if:  Your systolic blood pressure is 130 or higher.  Your diastolic blood pressure is 80 or higher. Take medicines only as told by your health care provider. Follow the directions carefully. Blood pressure medicines must be taken as prescribed. The medicine does not work as well when you skip doses. Skipping doses also puts you at risk for problems. Contact a health care provider  if:  You think you are having a reaction to medicines you have taken.  You have repeated (recurrent) headaches.  You feel dizzy.  You have swelling in your ankles.  You have trouble with your vision. Get help right away if:  You develop a severe headache or confusion.  You have unusual weakness or numbness, or you feel faint.  You have severe pain in your chest or abdomen.  You vomit repeatedly.  You have trouble breathing. Summary  Hypertension is when the force of blood pumping through your arteries is too strong. If this condition is not controlled, it may put you at risk for serious complications.  Your personal target blood pressure may vary depending on your medical conditions, your age, and other factors. For most people, a normal blood pressure is less than 120/80.  Hypertension is managed by lifestyle changes, medicines, or both. Lifestyle changes include weight loss, eating a healthy, low-sodium diet, exercising more, and limiting alcohol. This information is not intended to replace advice given to you by your health care provider. Make sure you discuss any questions you have with your health care provider. Document Revised: 12/16/2018 Document Reviewed: 07/22/2016 Elsevier Patient Education  [place]. High Cholesterol  High cholesterol is a condition in which the blood has high levels of a white, waxy, fat-like substance (cholesterol). The human body needs small amounts of cholesterol. The liver makes all the cholesterol that the body needs. Extra (excess) cholesterol comes from the food that we eat. Cholesterol is carried from the liver by the blood through the blood vessels. If you have high cholesterol, deposits (plaques)  may build up on the walls of your blood vessels (arteries). Plaques make the arteries narrower and stiffer. Cholesterol plaques increase your risk for heart attack and stroke. Work with your health care provider to keep your cholesterol  levels in a healthy range. What increases the risk? This condition is more likely to develop in people who:  Eat foods that are high in animal fat (saturated fat) or cholesterol.  Are overweight.  Are not getting enough exercise.  Have a family history of high cholesterol. What are the signs or symptoms? There are no symptoms of this condition. How is this diagnosed? This condition may be diagnosed from the results of a blood test.  If you are older than age 11, your health care provider may check your cholesterol every 4-6 years.  You may be checked more often if you already have high cholesterol or other risk factors for heart disease. The blood test for cholesterol measures:  "Bad" cholesterol (LDL cholesterol). This is the main type of cholesterol that causes heart disease. The desired level for LDL is less than 100.  "Good" cholesterol (HDL cholesterol). This type helps to protect against heart disease by cleaning the arteries and carrying the LDL away. The desired level for HDL is 60 or higher.  Triglycerides. These are fats that the body can store or burn for energy. The desired number for triglycerides is lower than 150.  Total cholesterol. This is a measure of the total amount of cholesterol in your blood, including LDL cholesterol, HDL cholesterol, and triglycerides. A healthy number is less than 200. How is this treated? This condition is treated with diet changes, lifestyle changes, and medicines. Diet changes  This may include eating more whole grains, fruits, vegetables, nuts, and fish.  This may also include cutting back on red meat and foods that have a lot of added sugar. Lifestyle changes  Changes may include getting at least 40 minutes of aerobic exercise 3 times a week. Aerobic exercises include walking, biking, and swimming. Aerobic exercise along with a healthy diet can help you maintain a healthy weight.  Changes may also include quitting  smoking. Medicines  Medicines are usually given if diet and lifestyle changes have failed to reduce your cholesterol to healthy levels.  Your health care provider may prescribe a statin medicine. Statin medicines have been shown to reduce cholesterol, which can reduce the risk of heart disease. Follow these instructions at home: Eating and drinking If told by your health care provider:  Eat chicken (without skin), fish, veal, shellfish, ground [place] breast, and round or loin cuts of red meat.  Do not eat fried foods or fatty meats, such as hot dogs and salami.  Eat plenty of fruits, such as apples.  Eat plenty of vegetables, such as broccoli, potatoes, and carrots.  Eat beans, peas, and lentils.  Eat grains such as barley, rice, couscous, and bulgur wheat.  Eat pasta without cream sauces.  Use skim or nonfat milk, and eat low-fat or nonfat yogurt and cheeses.  Do not eat or drink whole milk, cream, ice cream, egg yolks, or hard cheeses.  Do not eat stick margarine or tub margarines that contain trans fats (also called partially hydrogenated oils).  Do not eat saturated tropical oils, such as coconut oil and palm oil.  Do not eat cakes, cookies, crackers, or other baked goods that contain trans fats.  General instructions  Exercise as directed by your health care provider. Increase your activity level with activities such  as gardening, walking, and taking the stairs.  Take over-the-counter and prescription medicines only as told by your health care provider.  Do not use any products that contain nicotine or tobacco, such as cigarettes and e-cigarettes. If you need help quitting, ask your health care provider.  Keep all follow-up visits as told by your health care provider. This is important. Contact a health care provider if:  You are struggling to maintain a healthy diet or weight.  You need help to start on an exercise program.  You need help to stop smoking. Get  help right away if:  You have chest pain.  You have trouble breathing. This information is not intended to replace advice given to you by your health care provider. Make sure you discuss any questions you have with your health care provider. Document Revised: 08/27/2017 Document Reviewed: 02/22/2016 Elsevier Patient Education  [place]. Prediabetes Prediabetes is the condition of having a blood sugar (blood glucose) level that is higher than it should be, but not high enough for you to be diagnosed with type 2 diabetes. Having prediabetes puts you at risk for developing type 2 diabetes (type 2 diabetes mellitus). Prediabetes may be called impaired glucose tolerance or impaired fasting glucose. Prediabetes usually does not cause symptoms. Your health care provider can diagnose this condition with blood tests. You may be tested for prediabetes if you are overweight and if you have at least one other risk factor for prediabetes. What is blood glucose, and how is it measured? Blood glucose refers to the amount of glucose in your bloodstream. Glucose comes from eating foods that contain sugars and starches (carbohydrates), which the body breaks down into glucose. Your blood glucose level may be measured in mg/dL (milligrams per deciliter) or mmol/L (millimoles per liter). Your blood glucose may be checked with one or more of the following blood tests:  A fasting blood glucose (FBG) test. You will not be allowed to eat (you will fast) for 8 hours or longer before a blood sample is taken. ? A normal range for FBG is 70-100 mg/dl (3.9-5.6 mmol/L).  An A1c (hemoglobin A1c) blood test. This test provides information about blood glucose control over the previous 2?48months.  An oral glucose tolerance test (OGTT). This test measures your blood glucose at two times: ? After fasting. This is your baseline level. ? Two hours after you drink a beverage that contains glucose. You may be diagnosed with  prediabetes:  If your FBG is 100?125 mg/dL (5.6-6.9 mmol/L).  If your A1c level is 5.7?6.4%.  If your OGTT result is 140?199 mg/dL (7.8-11 mmol/L). These blood tests may be repeated to confirm your diagnosis. How can this condition affect me? The pancreas produces a hormone (insulin) that helps to move glucose from the bloodstream into cells. When cells in the body do not respond properly to insulin that the body makes (insulin resistance), excess glucose builds up in the blood instead of going into cells. As a result, high blood glucose (hyperglycemia) can develop, which can cause many complications. Hyperglycemia is a symptom of prediabetes. Having high blood glucose for a long time is dangerous. Too much glucose in your blood can damage your nerves and blood vessels. Long-term damage can lead to complications from diabetes, which may include:  Heart disease.  Stroke.  Blindness.  Kidney disease.  Depression.  Poor circulation in the feet and legs, which could lead to surgical removal (amputation) in severe cases. What can increase my risk? Risk factors  for prediabetes include:  Having a family member with type 2 diabetes.  Being overweight or obese.  Being older than age 30.  Being of American [place], African-American, Hispanic/Latino, or Asian/Pacific Islander descent.  Having an inactive (sedentary) lifestyle.  Having a history of heart disease.  History of gestational diabetes or polycystic ovary syndrome (PCOS), in women.  Having low levels of good cholesterol (HDL-C) or high levels of blood fats (triglycerides).  Having high blood pressure. What actions can I take to prevent diabetes?      Be physically active. ? Do moderate-intensity physical activity for 30 or more minutes on 5 or more days of the week, or as much as told by your health care provider. This could be brisk walking, biking, or water aerobics. ? Ask your health care provider what activities are  safe for you. A mix of physical activities may be best, such as walking, swimming, cycling, and strength training.  Lose weight as told by your health care provider. ? Losing 5-7% of your body weight can reverse insulin resistance. ? Your health care provider can determine how much weight loss is best for you and can help you lose weight safely.  Follow a healthy meal plan. This includes eating lean proteins, complex carbohydrates, fresh fruits and vegetables, low-fat dairy products, and healthy fats. ? Follow instructions from your health care provider about eating or drinking restrictions. ? Make an appointment to see a diet and nutrition specialist (registered dietitian) to help you create a healthy eating plan that is right for you.  Do not smoke or use any tobacco products, such as cigarettes, chewing tobacco, and e-cigarettes. If you need help quitting, ask your health care provider.  Take over-the-counter and prescription medicines as told by your health care provider. You may be prescribed medicines that help lower the risk of type 2 diabetes.  Keep all follow-up visits as told by your health care provider. This is important. Summary  Prediabetes is the condition of having a blood sugar (blood glucose) level that is higher than it should be, but not high enough for you to be diagnosed with type 2 diabetes.  Having prediabetes puts you at risk for developing type 2 diabetes (type 2 diabetes mellitus).  To help prevent type 2 diabetes, make lifestyle changes such as being physically active and eating a healthy diet. Lose weight as told by your health care provider. This information is not intended to replace advice given to you by your health care provider. Make sure you discuss any questions you have with your health care provider. Document Revised: 12/16/2018 Document Reviewed: 10/15/2015 Elsevier Patient Education  2020 [place]. Prediabetes Eating Plan Prediabetes is a  condition that causes blood sugar (glucose) levels to be higher than normal. This increases the risk for developing diabetes. In order to prevent diabetes from developing, your health care provider may recommend a diet and other lifestyle changes to help you:  Control your blood glucose levels.  Improve your cholesterol levels.  Manage your blood pressure. Your health care provider may recommend working with a diet and nutrition specialist (dietitian) to make a meal plan that is best for you. What are tips for following this plan? Lifestyle  Set weight loss goals with the help of your health care team. It is recommended that most people with prediabetes lose 7% of their current body weight.  Exercise for at least 30 minutes at least 5 days a week.  Attend a support group or seek ongoing  support from a mental health counselor.  Take over-the-counter and prescription medicines only as told by your health care provider. Reading food labels  Read food labels to check the amount of fat, salt (sodium), and sugar in prepackaged foods. Avoid foods that have: ? Saturated fats. ? Trans fats. ? Added sugars.  Avoid foods that have more than 300 milligrams (mg) of sodium per serving. Limit your daily sodium intake to less than 2,300 mg each day. Shopping  Avoid buying pre-made and processed foods. Cooking  Cook with olive oil. Do not use butter, lard, or ghee.  Bake, broil, grill, or boil foods. Avoid frying. Meal planning   Work with your dietitian to develop an eating plan that is right for you. This may include: ? Tracking how many calories you take in. Use a food diary, notebook, or mobile application to track what you eat at each meal. ? Using the glycemic index (GI) to plan your meals. The index tells you how quickly a food will raise your blood glucose. Choose low-GI foods. These foods take a longer time to raise blood glucose.  Consider following a Mediterranean diet. This diet  includes: ? Several servings each day of fresh fruits and vegetables. ? Eating fish at least twice a week. ? Several servings each day of whole grains, beans, nuts, and seeds. ? Using olive oil instead of other fats. ? Moderate alcohol consumption. ? Eating small amounts of red meat and whole-fat dairy.  If you have high blood pressure, you may need to limit your sodium intake or follow a diet such as the DASH eating plan. DASH is an eating plan that aims to lower high blood pressure. What foods are recommended? The items listed below may not be a complete list. Talk with your dietitian about what dietary choices are best for you. Grains Whole grains, such as whole-wheat or whole-grain breads, crackers, cereals, and pasta. Unsweetened oatmeal. Bulgur. Barley. Quinoa. Brown rice. Corn or whole-wheat flour tortillas or taco shells. Vegetables Lettuce. Spinach. Peas. Beets. Cauliflower. Cabbage. Broccoli. Carrots. Tomatoes. Squash. Eggplant. Herbs. Peppers. Onions. Cucumbers. Brussels sprouts. Fruits Berries. Bananas. Apples. Oranges. Grapes. Papaya. Mango. Pomegranate. Kiwi. Grapefruit. Cherries. Meats and other protein foods Seafood. Poultry without skin. Lean cuts of pork and beef. Tofu. Eggs. Nuts. Beans. Dairy Low-fat or fat-free dairy products, such as yogurt, cottage cheese, and cheese. Beverages Water. Tea. Coffee. Sugar-free or diet soda. Seltzer water. Lowfat or no-fat milk. Milk alternatives, such as soy or almond milk. Fats and oils Olive oil. Canola oil. Sunflower oil. Grapeseed oil. Avocado. Walnuts. Sweets and desserts Sugar-free or low-fat pudding. Sugar-free or low-fat ice cream and other frozen treats. Seasoning and other foods Herbs. Sodium-free spices. Mustard. Relish. Low-fat, low-sugar ketchup. Low-fat, low-sugar barbecue sauce. Low-fat or fat-free mayonnaise. What foods are not recommended? The items listed below may not be a complete list. Talk with your dietitian  about what dietary choices are best for you. Grains Refined white flour and flour products, such as bread, pasta, snack foods, and cereals. Vegetables Canned vegetables. Frozen vegetables with butter or cream sauce. Fruits Fruits canned with syrup. Meats and other protein foods Fatty cuts of meat. Poultry with skin. Breaded or fried meat. Processed meats. Dairy Full-fat yogurt, cheese, or milk. Beverages Sweetened drinks, such as sweet iced tea and soda. Fats and oils Butter. Lard. Ghee. Sweets and desserts Baked goods, such as cake, cupcakes, pastries, cookies, and cheesecake. Seasoning and other foods Spice mixes with added salt. Ketchup. Barbecue sauce. Mayonnaise.  Summary  To prevent diabetes from developing, you may need to make diet and other lifestyle changes to help control blood sugar, improve cholesterol levels, and manage your blood pressure.  Set weight loss goals with the help of your health care team. It is recommended that most people with prediabetes lose 7 percent of their current body weight.  Consider following a Mediterranean diet that includes plenty of fresh fruits and vegetables, whole grains, beans, nuts, seeds, fish, lean meat, low-fat dairy, and healthy oils. This information is not intended to replace advice given to you by your health care provider. Make sure you discuss any questions you have with your health care provider. Document Revised: 12/16/2018 Document Reviewed: 10/28/2016 Elsevier Patient Education  2020 [employer].

## 2020-03-06 ENCOUNTER — Other Ambulatory Visit: Payer: Self-pay

## 2020-03-06 VITALS — BP 130/71 | HR 78 | Wt 291.0 lb

## 2020-03-06 DIAGNOSIS — R350 Frequency of micturition: Secondary | ICD-10-CM

## 2020-03-06 DIAGNOSIS — R7309 Other abnormal glucose: Secondary | ICD-10-CM

## 2020-03-06 LAB — POCT URINALYSIS DIPSTICK
Bilirubin, UA: NEGATIVE
Blood, UA: POSITIVE
Glucose, UA: NEGATIVE
Leukocytes, UA: NEGATIVE
Nitrite, UA: NEGATIVE
Protein, UA: POSITIVE — AB
Spec Grav, UA: >=1.03 — AB (ref 1.010–1.025)
Urobilinogen, UA: 0.2 E.U./dL
pH, UA: 5.5 (ref 5.0–8.0)

## 2020-03-06 NOTE — Progress Notes (Signed)
Notes from Gerarda Fraction, NP-C's 12/06/19 office visit:  Patient reported sometimes feels like he has to urinate a lot.  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  Spot glucose elevated HgbA1c normal but if blood sugar elevated body may also urinate more frequently trying to normalize blood sugar level.  Repeat POC urinalysis if new or worsening symptoms.  Repeat HgbA1c, BP and weight check in 3 months.  Paient verbalized understanding information/instructions, agreed with plan of care and had no further questions at this time.  AMD

## 2020-03-07 LAB — HEMOGLOBIN A1C
Est. average glucose Bld gHb Est-mCnc: 111 mg/dL
Hgb A1c MFr Bld: 5.5 % (ref 4.8–5.6)

## 2020-03-26 ENCOUNTER — Encounter: Payer: Self-pay | Admitting: Registered Nurse

## 2020-03-26 DIAGNOSIS — R809 Proteinuria, unspecified: Secondary | ICD-10-CM

## 2020-03-26 DIAGNOSIS — E7800 Pure hypercholesterolemia, unspecified: Secondary | ICD-10-CM

## 2020-03-26 DIAGNOSIS — I10 Essential (primary) hypertension: Secondary | ICD-10-CM

## 2020-03-28 MED ORDER — BENAZEPRIL HCL 40 MG PO TABS: 40.0000 mg | ORAL_TABLET | Freq: Every day | ORAL | 0 refills | Status: DC

## 2020-03-28 MED ORDER — SIMVASTATIN 20 MG PO TABS: 20.0000 mg | ORAL_TABLET | Freq: Every day | ORAL | 0 refills | Status: DC

## 2020-03-28 NOTE — Telephone Encounter (Signed)
My last appt with patient Mar 2021.  Patient requested medication refills via mychart  90 day supply electronic Rx for simvastatin and benazepril sent to his pharmacy of choice.  Notified patient I am no longer working at [facility] regularly and he will need to follow up with Dr Jimmye Norman next month for repeat urinalysis as new proteinuria

## 2020-03-29 NOTE — Telephone Encounter (Signed)
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  Message sent to patient reminding him that he will need to establish care with new PCM due to clinic eligibility ceases in 2 months.  Also encouraged patient to schedule his appt for lab/visit with Dr Jimmye Norman also to ensure he does not run out of his prescriptions while trying to establish care with new PCM.

## 2020-04-07 ENCOUNTER — Emergency Department
Admission: EM | Admit: 2020-04-07 | Discharge: 2020-04-07 | Disposition: A | Discharge status: Home / Self Care | Payer: BC Managed Care – PPO | Attending: Emergency Medicine | Admitting: Emergency Medicine

## 2020-04-07 ENCOUNTER — Emergency Department: Payer: BC Managed Care – PPO

## 2020-04-07 ENCOUNTER — Other Ambulatory Visit: Payer: Self-pay

## 2020-04-07 DIAGNOSIS — R600 Localized edema: Secondary | ICD-10-CM

## 2020-04-07 DIAGNOSIS — N183 Chronic kidney disease, stage 3 unspecified: Secondary | ICD-10-CM | POA: Insufficient documentation

## 2020-04-07 DIAGNOSIS — I129 Hypertensive chronic kidney disease with stage 1 through stage 4 chronic kidney disease, or unspecified chronic kidney disease: Secondary | ICD-10-CM | POA: Diagnosis not present

## 2020-04-07 DIAGNOSIS — Z79899 Other long term (current) drug therapy: Secondary | ICD-10-CM | POA: Insufficient documentation

## 2020-04-07 DIAGNOSIS — R2243 Localized swelling, mass and lump, lower limb, bilateral: Secondary | ICD-10-CM | POA: Diagnosis not present

## 2020-04-07 LAB — COMPREHENSIVE METABOLIC PANEL
ALT: 20 U/L (ref 0–44)
AST: 25 U/L (ref 15–41)
Albumin: 2.7 g/dL — ABNORMAL LOW (ref 3.5–5.0)
Alkaline Phosphatase: 87 U/L (ref 38–126)
Anion gap: 7 (ref 5–15)
BUN: 15 mg/dL (ref 6–20)
CO2: 27 mmol/L (ref 22–32)
Calcium: 8.3 mg/dL — ABNORMAL LOW (ref 8.9–10.3)
Chloride: 108 mmol/L (ref 98–111)
Creatinine, Ser: 0.99 mg/dL (ref 0.61–1.24)
GFR calc Af Amer: >60 mL/min (ref >60)
GFR calc non Af Amer: >60 mL/min (ref >60)
Glucose, Bld: 119 mg/dL — ABNORMAL HIGH (ref 70–99)
Potassium: 4.2 mmol/L (ref 3.5–5.1)
Sodium: 142 mmol/L (ref 135–145)
Total Bilirubin: 0.6 mg/dL (ref 0.3–1.2)
Total Protein: 5.6 g/dL — ABNORMAL LOW (ref 6.5–8.1)

## 2020-04-07 LAB — URINALYSIS, COMPLETE (UACMP) WITH MICROSCOPIC
Bacteria, UA: NONE SEEN
Bilirubin Urine: NEGATIVE
Glucose, UA: NEGATIVE mg/dL
Hgb urine dipstick: NEGATIVE
Ketones, ur: NEGATIVE mg/dL
Leukocytes,Ua: NEGATIVE
Nitrite: NEGATIVE
Protein, ur: >=300 mg/dL — AB
Specific Gravity, Urine: 1.015 (ref 1.005–1.030)
pH: 5 (ref 5.0–8.0)

## 2020-04-07 LAB — CBC WITH DIFFERENTIAL/PLATELET
Abs Immature Granulocytes: 0.03 10*3/uL (ref 0.00–0.07)
Basophils Absolute: 0 10*3/uL (ref 0.0–0.1)
Basophils Relative: 1 %
Eosinophils Absolute: 0.4 10*3/uL (ref 0.0–0.5)
Eosinophils Relative: 5 %
HCT: 42.6 % (ref 39.0–52.0)
Hemoglobin: 14.5 g/dL (ref 13.0–17.0)
Immature Granulocytes: 0 %
Lymphocytes Relative: 27 %
Lymphs Abs: 2.1 10*3/uL (ref 0.7–4.0)
MCH: 30.8 pg (ref 26.0–34.0)
MCHC: 34 g/dL (ref 30.0–36.0)
MCV: 90.4 fL (ref 80.0–100.0)
Monocytes Absolute: 0.7 10*3/uL (ref 0.1–1.0)
Monocytes Relative: 9 %
Neutro Abs: 4.5 10*3/uL (ref 1.7–7.7)
Neutrophils Relative %: 58 %
Platelets: 179 10*3/uL (ref 150–400)
RBC: 4.71 MIL/uL (ref 4.22–5.81)
RDW: 12.4 % (ref 11.5–15.5)
WBC: 7.7 10*3/uL (ref 4.0–10.5)
nRBC: 0 % (ref 0.0–0.2)

## 2020-04-07 LAB — BRAIN NATRIURETIC PEPTIDE: B Natriuretic Peptide: 78.5 pg/mL (ref 0.0–100.0)

## 2020-04-07 LAB — TROPONIN I (HIGH SENSITIVITY): Troponin I (High Sensitivity): 10 ng/L (ref <18)

## 2020-04-07 MED ORDER — FUROSEMIDE 20 MG PO TABS: 20.0000 mg | ORAL_TABLET | Freq: Every day | ORAL | 0 refills | Status: DC

## 2020-04-07 NOTE — Discharge Instructions (Signed)
1.  Take diuretic prescribed (Lasix 20 mg daily x3 days). 2.  Wear TED hose during the day to help minimize swelling. 3.  Return to the ER for worsening symptoms, persistent vomiting, difficulty breathing or other concerns.
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
## 2020-04-07 NOTE — ED Triage Notes (Signed)
Patient reports swelling in feet and legs.

## 2020-04-07 NOTE — ED Notes (Signed)
MD at bedside. 

## 2020-04-07 NOTE — ED Notes (Signed)
Pt and family updated to the best of this RNs ability. Pt in NAD at this time.

## 2020-04-07 NOTE — ED Notes (Signed)
Floor called in regards to TED hose and report they will send them to ED.

## 2020-04-07 NOTE — ED Notes (Signed)
Pt transported to Xray. 

## 2020-04-07 NOTE — ED Notes (Signed)
TED hose placed and pt given smaller pair for home use. Pt able to ambulate to car. NAD at this time.

## 2020-04-10 ENCOUNTER — Other Ambulatory Visit
Admission: RE | Admit: 2020-04-10 | Discharge: 2020-04-10 | Disposition: A | Discharge status: Home / Self Care | Payer: BC Managed Care – PPO | Source: Ambulatory Visit | Attending: Family Medicine | Admitting: Family Medicine

## 2020-04-10 DIAGNOSIS — R600 Localized edema: Secondary | ICD-10-CM | POA: Insufficient documentation

## 2020-04-10 DIAGNOSIS — M7989 Other specified soft tissue disorders: Secondary | ICD-10-CM | POA: Diagnosis present

## 2020-04-10 DIAGNOSIS — I10 Essential (primary) hypertension: Secondary | ICD-10-CM | POA: Insufficient documentation

## 2020-04-10 LAB — BRAIN NATRIURETIC PEPTIDE: B Natriuretic Peptide: 57.1 pg/mL (ref 0.0–100.0)

## 2020-04-11 ENCOUNTER — Other Ambulatory Visit: Payer: Self-pay | Admitting: Registered Nurse

## 2020-04-11 DIAGNOSIS — I10 Essential (primary) hypertension: Secondary | ICD-10-CM

## 2020-04-11 NOTE — Telephone Encounter (Signed)
[facility]. Thanks

## 2020-05-06 ENCOUNTER — Other Ambulatory Visit: Payer: Self-pay

## 2020-05-06 ENCOUNTER — Encounter: Payer: Self-pay | Admitting: *Deleted

## 2020-05-06 ENCOUNTER — Emergency Department: Payer: BC Managed Care – PPO

## 2020-05-06 DIAGNOSIS — Z8249 Family history of ischemic heart disease and other diseases of the circulatory system: Secondary | ICD-10-CM

## 2020-05-06 DIAGNOSIS — M109 Gout, unspecified: Secondary | ICD-10-CM | POA: Diagnosis present

## 2020-05-06 DIAGNOSIS — E785 Hyperlipidemia, unspecified: Secondary | ICD-10-CM | POA: Diagnosis present

## 2020-05-06 DIAGNOSIS — E781 Pure hyperglyceridemia: Secondary | ICD-10-CM | POA: Diagnosis present

## 2020-05-06 DIAGNOSIS — N049 Nephrotic syndrome with unspecified morphologic changes: Secondary | ICD-10-CM | POA: Diagnosis present

## 2020-05-06 DIAGNOSIS — R809 Proteinuria, unspecified: Secondary | ICD-10-CM | POA: Diagnosis not present

## 2020-05-06 DIAGNOSIS — Z981 Arthrodesis status: Secondary | ICD-10-CM

## 2020-05-06 DIAGNOSIS — I10 Essential (primary) hypertension: Secondary | ICD-10-CM | POA: Diagnosis present

## 2020-05-06 DIAGNOSIS — Z79899 Other long term (current) drug therapy: Secondary | ICD-10-CM

## 2020-05-06 DIAGNOSIS — Z6841 Body Mass Index (BMI) 40.0 and over, adult: Secondary | ICD-10-CM

## 2020-05-06 DIAGNOSIS — N179 Acute kidney failure, unspecified: Secondary | ICD-10-CM | POA: Diagnosis not present

## 2020-05-06 DIAGNOSIS — Z20822 Contact with and (suspected) exposure to covid-19: Secondary | ICD-10-CM | POA: Diagnosis present

## 2020-05-06 DIAGNOSIS — E8809 Other disorders of plasma-protein metabolism, not elsewhere classified: Secondary | ICD-10-CM | POA: Diagnosis present

## 2020-05-06 LAB — CBC
HCT: 42.4 % (ref 39.0–52.0)
Hemoglobin: 14.4 g/dL (ref 13.0–17.0)
MCH: 30.8 pg (ref 26.0–34.0)
MCHC: 34 g/dL (ref 30.0–36.0)
MCV: 90.6 fL (ref 80.0–100.0)
Platelets: 186 10*3/uL (ref 150–400)
RBC: 4.68 MIL/uL (ref 4.22–5.81)
RDW: 12.9 % (ref 11.5–15.5)
WBC: 10.2 10*3/uL (ref 4.0–10.5)
nRBC: 0 % (ref 0.0–0.2)

## 2020-05-06 LAB — URINALYSIS, COMPLETE (UACMP) WITH MICROSCOPIC
Bacteria, UA: NONE SEEN
Bilirubin Urine: NEGATIVE
Glucose, UA: >=500 mg/dL — AB
Hgb urine dipstick: NEGATIVE
Ketones, ur: NEGATIVE mg/dL
Leukocytes,Ua: NEGATIVE
Nitrite: NEGATIVE
Protein, ur: >=300 mg/dL — AB
Specific Gravity, Urine: 1.027 (ref 1.005–1.030)
pH: 7 (ref 5.0–8.0)

## 2020-05-06 LAB — BASIC METABOLIC PANEL
Anion gap: 8 (ref 5–15)
BUN: 31 mg/dL — ABNORMAL HIGH (ref 6–20)
CO2: 29 mmol/L (ref 22–32)
Calcium: 7.4 mg/dL — ABNORMAL LOW (ref 8.9–10.3)
Chloride: 104 mmol/L (ref 98–111)
Creatinine, Ser: 1.72 mg/dL — ABNORMAL HIGH (ref 0.61–1.24)
GFR calc Af Amer: 53 mL/min — ABNORMAL LOW (ref >60)
GFR calc non Af Amer: 45 mL/min — ABNORMAL LOW (ref >60)
Glucose, Bld: 108 mg/dL — ABNORMAL HIGH (ref 70–99)
Potassium: 3.8 mmol/L (ref 3.5–5.1)
Sodium: 141 mmol/L (ref 135–145)

## 2020-05-06 LAB — TROPONIN I (HIGH SENSITIVITY): Troponin I (High Sensitivity): 14 ng/L (ref <18)

## 2020-05-06 NOTE — ED Triage Notes (Signed)
Pt sent to er for eval of edema from [facility].  Pt reports weight gain 40 pounds over 1 month.  No chest pain or sob.  States kidney function is not working.  Pt alert  Speech clear.

## 2020-05-07 ENCOUNTER — Emergency Department: Payer: BC Managed Care – PPO

## 2020-05-07 ENCOUNTER — Inpatient Hospital Stay
Admission: EM | Admit: 2020-05-07 | Discharge: 2020-05-11 | DRG: 683 | Disposition: A | Discharge status: Home / Self Care | Payer: BC Managed Care – PPO | Source: Ambulatory Visit | Attending: Hospitalist | Admitting: Hospitalist

## 2020-05-07 DIAGNOSIS — Z20822 Contact with and (suspected) exposure to covid-19: Secondary | ICD-10-CM | POA: Diagnosis present

## 2020-05-07 DIAGNOSIS — Z8249 Family history of ischemic heart disease and other diseases of the circulatory system: Secondary | ICD-10-CM | POA: Diagnosis not present

## 2020-05-07 DIAGNOSIS — E781 Pure hyperglyceridemia: Secondary | ICD-10-CM | POA: Diagnosis present

## 2020-05-07 DIAGNOSIS — E8809 Other disorders of plasma-protein metabolism, not elsewhere classified: Secondary | ICD-10-CM | POA: Diagnosis present

## 2020-05-07 DIAGNOSIS — Z6841 Body Mass Index (BMI) 40.0 and over, adult: Secondary | ICD-10-CM | POA: Diagnosis not present

## 2020-05-07 DIAGNOSIS — N179 Acute kidney failure, unspecified: Principal | ICD-10-CM

## 2020-05-07 DIAGNOSIS — I10 Essential (primary) hypertension: Secondary | ICD-10-CM | POA: Diagnosis present

## 2020-05-07 DIAGNOSIS — E785 Hyperlipidemia, unspecified: Secondary | ICD-10-CM | POA: Diagnosis present

## 2020-05-07 DIAGNOSIS — R809 Proteinuria, unspecified: Secondary | ICD-10-CM

## 2020-05-07 DIAGNOSIS — M7989 Other specified soft tissue disorders: Secondary | ICD-10-CM

## 2020-05-07 DIAGNOSIS — N049 Nephrotic syndrome with unspecified morphologic changes: Secondary | ICD-10-CM | POA: Diagnosis present

## 2020-05-07 DIAGNOSIS — Z981 Arthrodesis status: Secondary | ICD-10-CM | POA: Diagnosis not present

## 2020-05-07 DIAGNOSIS — M109 Gout, unspecified: Secondary | ICD-10-CM | POA: Diagnosis present

## 2020-05-07 DIAGNOSIS — E7800 Pure hypercholesterolemia, unspecified: Secondary | ICD-10-CM

## 2020-05-07 DIAGNOSIS — R601 Generalized edema: Secondary | ICD-10-CM

## 2020-05-07 DIAGNOSIS — Z8739 Personal history of other diseases of the musculoskeletal system and connective tissue: Secondary | ICD-10-CM

## 2020-05-07 DIAGNOSIS — Z79899 Other long term (current) drug therapy: Secondary | ICD-10-CM | POA: Diagnosis not present

## 2020-05-07 DIAGNOSIS — M79601 Pain in right arm: Secondary | ICD-10-CM

## 2020-05-07 LAB — COMPREHENSIVE METABOLIC PANEL
ALT: 15 U/L (ref 0–44)
AST: 25 U/L (ref 15–41)
Albumin: 2 g/dL — ABNORMAL LOW (ref 3.5–5.0)
Alkaline Phosphatase: 60 U/L (ref 38–126)
Anion gap: 6 (ref 5–15)
BUN: 30 mg/dL — ABNORMAL HIGH (ref 6–20)
CO2: 25 mmol/L (ref 22–32)
Calcium: 7.7 mg/dL — ABNORMAL LOW (ref 8.9–10.3)
Chloride: 108 mmol/L (ref 98–111)
Creatinine, Ser: 1.65 mg/dL — ABNORMAL HIGH (ref 0.61–1.24)
GFR calc Af Amer: 55 mL/min — ABNORMAL LOW (ref >60)
GFR calc non Af Amer: 48 mL/min — ABNORMAL LOW (ref >60)
Glucose, Bld: 108 mg/dL — ABNORMAL HIGH (ref 70–99)
Potassium: 4.1 mmol/L (ref 3.5–5.1)
Sodium: 139 mmol/L (ref 135–145)
Total Bilirubin: 0.9 mg/dL (ref 0.3–1.2)
Total Protein: 5.5 g/dL — ABNORMAL LOW (ref 6.5–8.1)

## 2020-05-07 LAB — HEPATITIS B SURFACE ANTIBODY,QUALITATIVE: Hep B S Ab: REACTIVE — AB

## 2020-05-07 LAB — HEPATITIS B SURFACE ANTIGEN: Hepatitis B Surface Ag: NONREACTIVE

## 2020-05-07 LAB — CBC
HCT: 44.4 % (ref 39.0–52.0)
Hemoglobin: 14.9 g/dL (ref 13.0–17.0)
MCH: 30.3 pg (ref 26.0–34.0)
MCHC: 33.6 g/dL (ref 30.0–36.0)
MCV: 90.2 fL (ref 80.0–100.0)
Platelets: 196 10*3/uL (ref 150–400)
RBC: 4.92 MIL/uL (ref 4.22–5.81)
RDW: 12.8 % (ref 11.5–15.5)
WBC: 7.9 10*3/uL (ref 4.0–10.5)
nRBC: 0.3 % — ABNORMAL HIGH (ref 0.0–0.2)

## 2020-05-07 LAB — PROTEIN / CREATININE RATIO, URINE
Creatinine, Urine: 178 mg/dL
Protein Creatinine Ratio: 13.4 mg/mg{Cre} — ABNORMAL HIGH (ref 0.00–0.15)
Total Protein, Urine: 2386 mg/dL

## 2020-05-07 LAB — CREATININE, SERUM
Creatinine, Ser: 1.61 mg/dL — ABNORMAL HIGH (ref 0.61–1.24)
GFR calc Af Amer: 57 mL/min — ABNORMAL LOW (ref >60)
GFR calc non Af Amer: 49 mL/min — ABNORMAL LOW (ref >60)

## 2020-05-07 LAB — HEPATITIS B CORE ANTIBODY, IGM: Hep B C IgM: NONREACTIVE

## 2020-05-07 LAB — PROTIME-INR
INR: 0.9 (ref 0.8–1.2)
Prothrombin Time: 12.2 seconds (ref 11.4–15.2)

## 2020-05-07 LAB — HEPATITIS C ANTIBODY: HCV Ab: NONREACTIVE

## 2020-05-07 LAB — TROPONIN I (HIGH SENSITIVITY): Troponin I (High Sensitivity): 8 ng/L (ref <18)

## 2020-05-07 LAB — BRAIN NATRIURETIC PEPTIDE: B Natriuretic Peptide: 11 pg/mL (ref 0.0–100.0)

## 2020-05-07 LAB — HIV ANTIBODY (ROUTINE TESTING W REFLEX): HIV Screen 4th Generation wRfx: NONREACTIVE

## 2020-05-07 LAB — MAGNESIUM: Magnesium: 2.9 mg/dL — ABNORMAL HIGH (ref 1.7–2.4)

## 2020-05-07 LAB — SARS CORONAVIRUS 2 BY RT PCR (HOSPITAL ORDER, PERFORMED IN ~~LOC~~ HOSPITAL LAB): SARS Coronavirus 2: NEGATIVE

## 2020-05-07 MED ORDER — SODIUM CHLORIDE 0.9 % IV SOLN
250.0000 mL | INTRAVENOUS | Status: DC | PRN
  Administered 2020-05-10 – 2020-05-11 (×2): 250 mL via INTRAVENOUS

## 2020-05-07 MED ORDER — FUROSEMIDE 10 MG/ML IJ SOLN
40.0000 mg | Freq: Once | INTRAMUSCULAR | Status: AC
  Administered 2020-05-07: 40 mg via INTRAVENOUS
  Filled 2020-05-07: qty 4

## 2020-05-07 MED ORDER — HYDRALAZINE HCL 20 MG/ML IJ SOLN
10.0000 mg | Freq: Four times a day (QID) | INTRAMUSCULAR | Status: DC | PRN
  Administered 2020-05-08: 10 mg via INTRAVENOUS
  Filled 2020-05-07: qty 1

## 2020-05-07 MED ORDER — BENAZEPRIL HCL 20 MG PO TABS
20.0000 mg | ORAL_TABLET | Freq: Every day | ORAL | Status: DC
  Administered 2020-05-07 – 2020-05-10 (×4): 20 mg via ORAL
  Filled 2020-05-07 (×6): qty 1

## 2020-05-07 MED ORDER — SIMVASTATIN 20 MG PO TABS
20.0000 mg | ORAL_TABLET | Freq: Every day | ORAL | Status: DC
  Filled 2020-05-07: qty 2
  Filled 2020-05-07: qty 1

## 2020-05-07 MED ORDER — ENOXAPARIN SODIUM 40 MG/0.4ML ~~LOC~~ SOLN
40.0000 mg | Freq: Two times a day (BID) | SUBCUTANEOUS | Status: DC
  Filled 2020-05-07: qty 0.4

## 2020-05-07 MED ORDER — SIMVASTATIN 20 MG PO TABS
20.0000 mg | ORAL_TABLET | Freq: Every day | ORAL | Status: DC
  Administered 2020-05-08: 20 mg via ORAL
  Filled 2020-05-07 (×2): qty 1

## 2020-05-07 MED ORDER — SODIUM CHLORIDE 0.9% FLUSH: 3.0000 mL | INTRAVENOUS | Status: DC | PRN

## 2020-05-07 MED ORDER — FUROSEMIDE 10 MG/ML IJ SOLN
60.0000 mg | Freq: Two times a day (BID) | INTRAMUSCULAR | Status: DC
  Administered 2020-05-07 – 2020-05-09 (×5): 60 mg via INTRAVENOUS
  Filled 2020-05-07 (×5): qty 8

## 2020-05-07 MED ORDER — SODIUM CHLORIDE 0.9% FLUSH
3.0000 mL | Freq: Two times a day (BID) | INTRAVENOUS | Status: DC
  Administered 2020-05-07 – 2020-05-11 (×8): 3 mL via INTRAVENOUS

## 2020-05-07 MED ORDER — POLYETHYLENE GLYCOL 3350 17 G PO PACK: 17.0000 g | PACK | Freq: Every day | ORAL | Status: DC | PRN

## 2020-05-07 MED ORDER — ACETAMINOPHEN 650 MG RE SUPP: 650.0000 mg | Freq: Four times a day (QID) | RECTAL | Status: DC | PRN

## 2020-05-07 MED ORDER — FUROSEMIDE 10 MG/ML IJ SOLN: 60.0000 mg | Freq: Two times a day (BID) | INTRAMUSCULAR | Status: DC

## 2020-05-07 MED ORDER — ONDANSETRON HCL 4 MG PO TABS: 4.0000 mg | ORAL_TABLET | Freq: Four times a day (QID) | ORAL | Status: DC | PRN

## 2020-05-07 MED ORDER — ACETAMINOPHEN 325 MG PO TABS
650.0000 mg | ORAL_TABLET | Freq: Four times a day (QID) | ORAL | Status: DC | PRN
  Administered 2020-05-08: 650 mg via ORAL
  Filled 2020-05-07: qty 2

## 2020-05-07 MED ORDER — ONDANSETRON HCL 4 MG/2ML IJ SOLN: 4.0000 mg | Freq: Four times a day (QID) | INTRAMUSCULAR | Status: DC | PRN
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
## 2020-05-07 NOTE — Progress Notes (Signed)
Spoke with patient's nurse earlier today, that is patient is on schedule for renal biopsy tomorrow am, please make NPO after MN, hold Lovenox for procedure as well as labs that are now ordered.

## 2020-05-07 NOTE — ED Notes (Signed)
Pt ate about half of lunch. Able to feed self.

## 2020-05-07 NOTE — ED Notes (Signed)
Pt provided urinal. Will administer meds when pt finished using toilet.

## 2020-05-07 NOTE — ED Notes (Signed)
Asked Marya Amsler RN to [place] pt for IV access and blood draw.
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
## 2020-05-07 NOTE — Progress Notes (Signed)
PHARMACIST - PHYSICIAN COMMUNICATION  CONCERNING:  Enoxaparin (Lovenox) for DVT Prophylaxis    RECOMMENDATION: Patient was prescribed enoxaprin 40mg  q24 hours for VTE prophylaxis.   Filed Weights   05/06/20 2017 05/06/20 2025  Weight: (!) 310 kg (683 lb 6.8 oz) (!) 140.6 kg (310 lb)    Body mass index is 44.48 kg/m.  Estimated Creatinine Clearance: 75.8 mL/min (A) (by C-G formula based on SCr of 1.65 mg/dL (H)).   Based on [place] patient is candidate for enoxaparin 40mg  every 12 hours due to BMI being >40.  DESCRIPTION: Pharmacy has adjusted enoxaparin dose per [facility] policy.  Patient is now receiving enoxaparin _40___mg every 12_hours    Makoa Satz, PharmD Clinical Pharmacist  05/07/2020 9:38 AM

## 2020-05-07 NOTE — ED Notes (Addendum)
Pt c/o leg swelling and feet swelling x 5 weeks. States has gained 35lb in last 5 weeks. Denies SOB. Has been taking BP meds and "a little bit of lasix."   States [place] [facility] brought him over last night. Pt is attempting to get in to see a kidney doctor but has been unable to yet.

## 2020-05-07 NOTE — ED Notes (Addendum)
Spoke to radiology RN about kidney biopsy tomorrow. She states hold lovenox tonight, needs PT/INR in AM, NPO at midnight. She stated she spoke to Dr. Abigail Butts about orders for those things. Give BP medication in AM, BP needs to be less than 397 systolic. If above 673 systolic will need to give some PRN BP meds. Consent will be signed once pt is pre-op. Will be coming back to ER (if no inpatient bed)after procedure post-op once BP and bleeding are monitored.

## 2020-05-08 ENCOUNTER — Inpatient Hospital Stay: Payer: BC Managed Care – PPO

## 2020-05-08 ENCOUNTER — Inpatient Hospital Stay (HOSPITAL_COMMUNITY)
Admit: 2020-05-08 | Discharge: 2020-05-08 | Disposition: A | Discharge status: Home / Self Care | Payer: BC Managed Care – PPO | Attending: Nephrology | Admitting: Nephrology

## 2020-05-08 DIAGNOSIS — R601 Generalized edema: Secondary | ICD-10-CM

## 2020-05-08 LAB — ANCA TITERS
Atypical P-ANCA titer: <1:20 {titer}
C-ANCA: <1:20 {titer}
P-ANCA: <1:20 {titer}

## 2020-05-08 LAB — PROTEIN ELECTRO, RANDOM URINE
Albumin ELP, Urine: 67.5 %
Alpha-1-Globulin, U: 8.7 %
Alpha-2-Globulin, U: 5.6 %
Beta Globulin, U: 14.1 %
Gamma Globulin, U: 4 %
Total Protein, Urine: 441.6 mg/dL

## 2020-05-08 LAB — CBC
HCT: 44 % (ref 39.0–52.0)
Hemoglobin: 14.6 g/dL (ref 13.0–17.0)
MCH: 30.2 pg (ref 26.0–34.0)
MCHC: 33.2 g/dL (ref 30.0–36.0)
MCV: 91.1 fL (ref 80.0–100.0)
Platelets: 180 10*3/uL (ref 150–400)
RBC: 4.83 MIL/uL (ref 4.22–5.81)
RDW: 13.1 % (ref 11.5–15.5)
WBC: 7.6 10*3/uL (ref 4.0–10.5)
nRBC: 0 % (ref 0.0–0.2)

## 2020-05-08 LAB — BASIC METABOLIC PANEL WITH GFR
Anion gap: 6 (ref 5–15)
BUN: 31 mg/dL — ABNORMAL HIGH (ref 6–20)
CO2: 28 mmol/L (ref 22–32)
Calcium: 7.7 mg/dL — ABNORMAL LOW (ref 8.9–10.3)
Chloride: 105 mmol/L (ref 98–111)
Creatinine, Ser: 1.57 mg/dL — ABNORMAL HIGH (ref 0.61–1.24)
GFR calc Af Amer: 59 mL/min — ABNORMAL LOW
GFR calc non Af Amer: 51 mL/min — ABNORMAL LOW
Glucose, Bld: 117 mg/dL — ABNORMAL HIGH (ref 70–99)
Potassium: 4.8 mmol/L (ref 3.5–5.1)
Sodium: 139 mmol/L (ref 135–145)

## 2020-05-08 LAB — HEMOGLOBIN A1C
Hgb A1c MFr Bld: 5.7 % — ABNORMAL HIGH (ref 4.8–5.6)
Mean Plasma Glucose: 116.89 mg/dL

## 2020-05-08 LAB — KAPPA/LAMBDA LIGHT CHAINS
Kappa free light chain: 63.1 mg/L — ABNORMAL HIGH (ref 3.3–19.4)
Kappa, lambda light chain ratio: 1.85 — ABNORMAL HIGH (ref 0.26–1.65)
Lambda free light chains: 34.2 mg/L — ABNORMAL HIGH (ref 5.7–26.3)

## 2020-05-08 LAB — ANA W/REFLEX: Anti Nuclear Antibody (ANA): NEGATIVE

## 2020-05-08 LAB — C3 COMPLEMENT: C3 Complement: 184 mg/dL — ABNORMAL HIGH (ref 82–167)

## 2020-05-08 LAB — PROTIME-INR
INR: 1 (ref 0.8–1.2)
Prothrombin Time: 12.5 seconds (ref 11.4–15.2)

## 2020-05-08 LAB — C4 COMPLEMENT: Complement C4, Body Fluid: 43 mg/dL — ABNORMAL HIGH (ref 12–38)

## 2020-05-08 LAB — GLOMERULAR BASEMENT MEMBRANE ANTIBODIES: GBM Ab: 3 units (ref 0–20)

## 2020-05-08 MED ORDER — MIDAZOLAM HCL 2 MG/2ML IJ SOLN
INTRAMUSCULAR | Status: AC
  Filled 2020-05-08: qty 4

## 2020-05-08 MED ORDER — MIDAZOLAM HCL 2 MG/2ML IJ SOLN
INTRAMUSCULAR | Status: AC | PRN
  Administered 2020-05-08 (×2): 1 mg via INTRAVENOUS

## 2020-05-08 MED ORDER — PERFLUTREN LIPID MICROSPHERE
1.0000 mL | INTRAVENOUS | Status: AC | PRN
  Administered 2020-05-08: 2 mL via INTRAVENOUS
  Filled 2020-05-08: qty 10

## 2020-05-08 MED ORDER — METHYLPREDNISOLONE SODIUM SUCC 125 MG IJ SOLR
125.0000 mg | Freq: Every day | INTRAMUSCULAR | Status: DC
  Administered 2020-05-08 – 2020-05-10 (×3): 125 mg via INTRAVENOUS
  Filled 2020-05-08 (×3): qty 2

## 2020-05-08 MED ORDER — ENOXAPARIN SODIUM 40 MG/0.4ML ~~LOC~~ SOLN
40.0000 mg | Freq: Two times a day (BID) | SUBCUTANEOUS | Status: DC
  Administered 2020-05-10: 40 mg via SUBCUTANEOUS
  Filled 2020-05-08 (×3): qty 0.4

## 2020-05-08 MED ORDER — SODIUM CHLORIDE 0.9 % IV SOLN
INTRAVENOUS | Status: AC | PRN
  Administered 2020-05-08: 10 mL/h via INTRAVENOUS

## 2020-05-08 MED ORDER — FENTANYL CITRATE (PF) 100 MCG/2ML IJ SOLN
INTRAMUSCULAR | Status: AC | PRN
  Administered 2020-05-08 (×2): 50 ug via INTRAVENOUS

## 2020-05-08 MED ORDER — FENTANYL CITRATE (PF) 100 MCG/2ML IJ SOLN
INTRAMUSCULAR | Status: AC
  Filled 2020-05-08: qty 4

## 2020-05-08 NOTE — Progress Notes (Signed)
Mobility Specialist - Progress Note   05/08/20 1356  Mobility  Activity Refused mobility (Cancellation)  Mobility performed by Mobility specialist     Per discussion w/ nurse, pt just got back from radiology and will not be able to get up till 5PM. Will hold off on mobility session at this time. Will attempt session at a later date/time when appropriate.    Kileigh Ortmann Mobility Specialist  05/08/20, 1:57 PM
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
## 2020-05-08 NOTE — Progress Notes (Signed)
[facility]  ROUNDING NOTE   Subjective:   CT guided renal biopsy this morning.  Wife at bedside.   UOP 1395 - IV furosemide 60mg  q12  Objective:  Vital signs in last 24 hours:  Temp:  [97.5 F (36.4 C)-98.5 F (36.9 C)] 98.4 F (36.9 C) (09/01 1457) Pulse Rate:  [71-97] 88 (09/01 1457) Resp:  [0-20] 12 (09/01 1457) BP: (131-172)/(68-98) 136/79 (09/01 1457) SpO2:  [95 %-100 %] 96 % (09/01 1457) Weight:  [138.8 kg] 138.8 kg (09/01 0500)  Weight change: -171.2 kg Filed Weights   05/06/20 2025 05/07/20 2321 05/08/20 0500  Weight: (!) 140.6 kg (!) 138.8 kg (!) 138.8 kg    Intake/Output: I/O last 3 completed shifts: In: 100 [P.O.:100] Out: 1395 [Urine:1395]   Intake/Output this shift:  Total I/O In: -  Out: 750 [Urine:750]  Physical Exam: General: NAD,   Head: Normocephalic, atraumatic. Moist oral mucosal membranes  Eyes: Anicteric, PERRL  Neck: Supple, trachea midline  Lungs:  Bilateral crackles  Heart: Regular rate and rhythm  Abdomen:  Soft, nontender,   Extremities:  ++ peripheral edema.  Neurologic: Nonfocal, moving all four extremities  Skin: No lesions        Basic Metabolic Panel: Recent Labs  Lab 05/06/20 2017 05/07/20 0734 05/07/20 0910 05/08/20 0629  NA 141 139  --  139  K 3.8 4.1  --  4.8  CL 104 108  --  105  CO2 29 25  --  28  GLUCOSE 108* 108*  --  117*  BUN 31* 30*  --  31*  CREATININE 1.72* 1.65* 1.61* 1.57*  CALCIUM 7.4* 7.7*  --  7.7*  MG  --  2.9*  --   --     Liver Function Tests: Recent Labs  Lab 05/07/20 0734  AST 25  ALT 15  ALKPHOS 60  BILITOT 0.9  PROT 5.5*  ALBUMIN 2.0*   No results for input(s): LIPASE, AMYLASE in the last 168 hours. No results for input(s): AMMONIA in the last 168 hours.  CBC: Recent Labs  Lab 05/06/20 2017 05/07/20 0910 05/08/20 0629  WBC 10.2 7.9 7.6  HGB 14.4 14.9 14.6  HCT 42.4 44.4 44.0  MCV 90.6 90.2 91.1  PLT 186 196 180    Cardiac Enzymes: No results for  input(s): CKTOTAL, CKMB, CKMBINDEX, TROPONINI in the last 168 hours.  BNP: Invalid input(s): POCBNP  CBG: No results for input(s): GLUCAP in the last 168 hours.  Microbiology: Results for orders placed or performed during the hospital encounter of 05/07/20  SARS Coronavirus 2 by RT PCR (hospital order, performed in [facility] hospital lab) Nasopharyngeal Nasopharyngeal Swab     Status: None   Collection Time: 05/07/20  8:12 AM   Specimen: Nasopharyngeal Swab  Result Value Ref Range Status   SARS Coronavirus 2 NEGATIVE NEGATIVE Final    Comment: (NOTE) SARS-CoV-2 target nucleic acids are NOT DETECTED.  The SARS-CoV-2 RNA is generally detectable in upper and lower respiratory specimens during the acute phase of infection. The lowest concentration of SARS-CoV-2 viral copies this assay can detect is 250 copies / mL. A negative result does not preclude SARS-CoV-2 infection and should not be used as the sole basis for treatment or other patient management decisions.  A negative result may occur with improper specimen collection / handling, submission of specimen other than nasopharyngeal swab, presence of viral mutation(s) within the areas targeted by this assay, and inadequate number of viral copies (<250 copies / mL).  A negative result must be combined with clinical observations, patient history, and epidemiological information.  Fact Sheet for Patients:   StrictlyIdeas.no  Fact Sheet for Healthcare Providers: BankingDealers.co.za  This test is not yet approved or  cleared by the [place] FDA and has been authorized for detection and/or diagnosis of SARS-CoV-2 by FDA under an Emergency Use Authorization (EUA).  This EUA will remain in effect (meaning this test can be used) for the duration of the COVID-19 declaration under Section 564(b)(1) of the Act, 21 U.S.C. section 360bbb-3(b)(1), unless the authorization is terminated  or revoked sooner.  Performed at [facility], [address] [place], [place] [address]     Coagulation Studies: Recent Labs    05/07/20 1350 05/08/20 0629  LABPROT 12.2 12.5  INR 0.9 1.0    Urinalysis: Recent Labs    05/06/20 2017  COLORURINE YELLOW*  LABSPEC 1.027  PHURINE 7.0  GLUCOSEU >=500*  HGBUR NEGATIVE  BILIRUBINUR NEGATIVE  KETONESUR NEGATIVE  PROTEINUR >=300*  NITRITE NEGATIVE  LEUKOCYTESUR NEGATIVE      Imaging: DG Chest 2 View  Result Date: 05/06/2020 CLINICAL DATA:  Peripheral edema EXAM: CHEST - 2 VIEW COMPARISON:  04/07/2020 FINDINGS: Lung volumes are small, but are symmetric and are clear. No pneumothorax or pleural effusion. Cardiac size is within normal limits. Pulmonary vascularity is normal. No acute bone abnormality. IMPRESSION: No active cardiopulmonary disease. Electronically Signed   By: Fidela Salisbury MD   On: 05/06/2020 20:43   US Renal  Result Date: 05/07/2020 CLINICAL DATA:  ARF.  Worsening edema. EXAM: RENAL / URINARY TRACT ULTRASOUND COMPLETE COMPARISON:  No prior FINDINGS: Right Kidney: Renal measurements: 12.1 x 6.6 x 6.7 cm = volume: 279.5 mL. Mild increased echogenicity cannot be excluded. 4.8 cm simple cyst. No hydronephrosis visualized. Left Kidney: Renal measurements: 10.7 x 6.9 x 6.2 cm = volume: 241.0 mL. Mild increase echogenicity cannot be excluded. No mass or hydronephrosis visualized. Bladder: Appears normal for degree of bladder distention. Other: None. IMPRESSION: 1. Mild bilateral renal increased echogenicity cannot be excluded. No acute renal abnormality. No hydronephrosis. No bladder distention. 2. 4.8 cm simple right renal cyst. Electronically Signed   By: Marcello Moores  Register   On: 05/07/2020 09:22   CT BIOPSY  Result Date: 05/08/2020 INDICATION: 49 year old male with acute kidney injury concerning for possible glomerulonephritis. He presents for random renal biopsy. Due to poor visualization on ultrasound, we will  proceed with CT-guided biopsy. EXAM: CT-guided random renal biopsy MEDICATIONS: None. ANESTHESIA/SEDATION: Moderate (conscious) sedation was employed during this procedure. A total of Versed 2 mg and Fentanyl 100 mcg was administered intravenously. Moderate Sedation Time: 26 minutes. The patient's level of consciousness and vital signs were monitored continuously by radiology nursing throughout the procedure under my direct supervision. FLUOROSCOPY TIME:  None COMPLICATIONS: None immediate. PROCEDURE: Informed written consent was obtained from the patient after a thorough discussion of the procedural risks, benefits and alternatives. All questions were addressed. A timeout was performed prior to the initiation of the procedure. The patient was placed prone on the CT gantry. A suitable skin entry site was selected and marked. The skin was sterilely prepped and draped in the standard fashion using chlorhexidine skin prep. Local anesthesia was attained by infiltration with 1% lidocaine. A small dermatotomy was made. Under intermittent CT guidance, a 15 gauge introducer needle was advanced and positioned at the margin of the lower pole cortex of the right kidney. Several a 16 gauge core biopsies were then coaxially obtained using  the Bard automated biopsy device. Biopsy specimens were placed in saline and delivered to pathology for further analysis. As the introducer needle was removed, the biopsy tract was embolized with a Gel-Foam slurry. Post biopsy CT imaging demonstrates no evidence of immediate complication. IMPRESSION: Technically successful CT-guided random core biopsy of the lower pole cortex of the left kidney. Electronically Signed   By: Jacqulynn Cadet M.D.   On: 05/08/2020 12:22     Medications:   . sodium chloride     . benazepril  20 mg Oral Daily  . [START ON 05/09/2020] enoxaparin (LOVENOX) injection  40 mg Subcutaneous Q12H  . furosemide  60 mg Intravenous BID  . methylPREDNISolone  (SOLU-MEDROL) injection  125 mg Intravenous Daily  . simvastatin  20 mg Oral q1800  . sodium chloride flush  3 mL Intravenous Q12H   sodium chloride, acetaminophen **OR** acetaminophen, hydrALAZINE, ondansetron **OR** ondansetron (ZOFRAN) IV, polyethylene glycol, sodium chloride flush  Assessment/ Plan:  [name] is a 49 y.o. white male with hypertension, hyperlipidemia, allergies who was admitted to [facility] on 05/07/2020 for AKI (acute kidney injury) ([place]) [N17.9]  1. Acute kidney injury:  2. Proteinuria: 3. Anasarca 4. Hypoalbuminemia 5. Hypertension  History suggestive of glomerulonephritis. Over 13 grams of proteinuria Serologic work up: ANA negative, anti-GBM negative, hemoglobin A1c within normal limits, viral hepatitis screen negative, HIV negative. Pending ANCA, SPEP/UPEP,  - high dose steroids - benazepril - IV furosemide - check echocardiogram - Kidney biopsy on 9/1    LOS: 1 Maisen Schmit 9/1/20213:11 PM

## 2020-05-08 NOTE — Progress Notes (Signed)
MEDICATION-RELATED CONSULT NOTE   IR Procedure Consult - Anticoagulant/Antiplatelet PTA/Inpatient Med List Review by Pharmacist    Procedure: Kidney biopsy    Completed: 05/08/20  1114  Post-Procedural bleeding risk per IR MD assessment: High    Antithrombotic medications on inpatient or PTA profile prior to procedure:   Lovenox 40 mg q12h (BMI of 43)    Recommended restart time per IR Post-Procedure Guidelines:  Day +1 (next am)   Other considerations:      Plan:   Lovenox 40 mg q12h for prophylaxis to restart 05/09/20 am

## 2020-05-08 NOTE — Progress Notes (Signed)
PROGRESS NOTE    REMIEL CORTI  ZWC:[id number] DOB: 1971-08-30 DOA: 05/07/2020 PCP: Patient, No Pcp Per    Assessment & Plan:   Principal Problem:   AKI (acute kidney injury) ([place]) Active Problems:   Essential hypertension   Class 2 severe obesity due to excess calories with serious comorbidity and body mass index (BMI) of 38.0 to 38.9 in adult [facility])   History of gout   Anasarca    RAJON BISIG is an 49 y.o. male with PMH significant for HTN and obesity was in his usual state of health until last month when he noted sudden onset edema of his lower extremities.  Was seen in the ED and started on oral Lasix with recommendations to follow-up with renal.  Patient has not been able to see a nephrologist as an outpatient yet and he continues to gain weight.  Patient states he has swelling up to his buttocks and he is finding it increasingly difficult to walk around due to fatigue and leg aching.   Anasarca most likely secondary to nephrotic syndrome Patient with significant proteinuria and anasarca that developed over the past month. --nephrology consulted PLAN: --kidney biopsy today --continue diuresis with IV lasix 60 BID, per nephrology --Strict I/O --2L fluid restriction  Acute kidney disease --nephrology consulted PLAN: --kidney biopsy today --diuresis as above to help with congestion --continue home benazepril, per nephrology  HTN --continue home benazepril and hold HCTZ, per nephrology --IV diuresis as above  HLD --continue home statin   DVT prophylaxis: Lovenox SQ Code Status: Full code  Family Communication: wife updated at bedside Status is: inpatient Dispo:   The patient is from: home Anticipated d/c is to: home Anticipated d/c date is: 2 days Patient currently is not medically stable to d/c due to: still needing IV diuresis, managed by nephrology   Subjective and Interval History:  Pt had kidney biopsy today, tolerated it well.  Pt didn't think  he put out a lot of urine, but swelling has improved.  Complained of left lateral thigh tenderness.   Objective: Vitals:   05/08/20 1247 05/08/20 1457 05/08/20 2043 05/09/20 0457  BP: 133/75 136/79 (!) 152/85 140/79  Pulse: 87 88 92 95  Resp: 15 12 16 16   Temp: 98 F (36.7 C) 98.4 F (36.9 C) 98.9 F (37.2 C) 97.6 F (36.4 C)  TempSrc: Oral Oral Oral   SpO2: 96% 96% 93% 94%  Weight:      Height:        Intake/Output Summary (Last 24 hours) at 05/09/2020 0501 Last data filed at 05/08/2020 2156 Gross per 24 hour  Intake --  Output 1050 ml  Net -1050 ml   Filed Weights   05/06/20 2025 05/07/20 2321 05/08/20 0500  Weight: (!) 140.6 kg (!) 138.8 kg (!) 138.8 kg    Examination:   Constitutional: NAD, AAOx3 HEENT: conjunctivae and lids normal, EOMI CV: RRR no M,R,G. Distal pulses +2.  No cyanosis.   RESP: CTA B/L, normal respiratory effort  GI: +BS, NTND Extremities: Generalized swelling in BLE SKIN: warm, dry and intact Neuro: II - XII grossly intact.  Sensation intact Psych: Normal mood and affect.  Appropriate judgement and reason   Data Reviewed: I have personally reviewed following labs and imaging studies  CBC: Recent Labs  Lab 05/06/20 2017 05/07/20 0910 05/08/20 0629  WBC 10.2 7.9 7.6  HGB 14.4 14.9 14.6  HCT 42.4 44.4 44.0  MCV 90.6 90.2 91.1  PLT 186 196 180  Basic Metabolic Panel: Recent Labs  Lab 05/06/20 2017 05/07/20 0734 05/07/20 0910 05/08/20 0629  NA 141 139  --  139  K 3.8 4.1  --  4.8  CL 104 108  --  105  CO2 29 25  --  28  GLUCOSE 108* 108*  --  117*  BUN 31* 30*  --  31*  CREATININE 1.72* 1.65* 1.61* 1.57*  CALCIUM 7.4* 7.7*  --  7.7*  MG  --  2.9*  --   --    GFR: Estimated Creatinine Clearance: 79.1 mL/min (A) (by C-G formula based on SCr of 1.57 mg/dL (H)). Liver Function Tests: Recent Labs  Lab 05/07/20 0734  AST 25  ALT 15  ALKPHOS 60  BILITOT 0.9  PROT 5.5*  ALBUMIN 2.0*   No results for input(s): LIPASE,  AMYLASE in the last 168 hours. No results for input(s): AMMONIA in the last 168 hours. Coagulation Profile: Recent Labs  Lab 05/07/20 1350 05/08/20 0629  INR 0.9 1.0   Cardiac Enzymes: No results for input(s): CKTOTAL, CKMB, CKMBINDEX, TROPONINI in the last 168 hours. BNP (last 3 results) No results for input(s): PROBNP in the last 8760 hours. HbA1C: Recent Labs    05/07/20 0922  HGBA1C 5.7*   CBG: No results for input(s): GLUCAP in the last 168 hours. Lipid Profile: No results for input(s): CHOL, HDL, LDLCALC, TRIG, CHOLHDL, LDLDIRECT in the last 72 hours. Thyroid Function Tests: No results for input(s): TSH, T4TOTAL, FREET4, T3FREE, THYROIDAB in the last 72 hours. Anemia Panel: No results for input(s): VITAMINB12, FOLATE, FERRITIN, TIBC, IRON, RETICCTPCT in the last 72 hours. Sepsis Labs: No results for input(s): PROCALCITON, LATICACIDVEN in the last 168 hours.  Recent Results (from the past 240 hour(s))  SARS Coronavirus 2 by RT PCR (hospital order, performed in [facility] hospital lab) Nasopharyngeal Nasopharyngeal Swab     Status: None   Collection Time: 05/07/20  8:12 AM   Specimen: Nasopharyngeal Swab  Result Value Ref Range Status   SARS Coronavirus 2 NEGATIVE NEGATIVE Final    Comment: (NOTE) SARS-CoV-2 target nucleic acids are NOT DETECTED.  The SARS-CoV-2 RNA is generally detectable in upper and lower respiratory specimens during the acute phase of infection. The lowest concentration of SARS-CoV-2 viral copies this assay can detect is 250 copies / mL. A negative result does not preclude SARS-CoV-2 infection and should not be used as the sole basis for treatment or other patient management decisions.  A negative result may occur with improper specimen collection / handling, submission of specimen other than nasopharyngeal swab, presence of viral mutation(s) within the areas targeted by this assay, and inadequate number of viral copies (<250 copies / mL). A  negative result must be combined with clinical observations, patient history, and epidemiological information.  Fact Sheet for Patients:   StrictlyIdeas.no  Fact Sheet for Healthcare Providers: BankingDealers.co.za  This test is not yet approved or  cleared by the [place] FDA and has been authorized for detection and/or diagnosis of SARS-CoV-2 by FDA under an Emergency Use Authorization (EUA).  This EUA will remain in effect (meaning this test can be used) for the duration of the COVID-19 declaration under Section 564(b)(1) of the Act, 21 U.S.C. section 360bbb-3(b)(1), unless the authorization is terminated or revoked sooner.  Performed at [facility], [address] [place], [place] [address]       Radiology Studies: US Renal  Result Date: 05/07/2020 CLINICAL DATA:  ARF.  Worsening edema. EXAM: RENAL /  URINARY TRACT ULTRASOUND COMPLETE COMPARISON:  No prior FINDINGS: Right Kidney: Renal measurements: 12.1 x 6.6 x 6.7 cm = volume: 279.5 mL. Mild increased echogenicity cannot be excluded. 4.8 cm simple cyst. No hydronephrosis visualized. Left Kidney: Renal measurements: 10.7 x 6.9 x 6.2 cm = volume: 241.0 mL. Mild increase echogenicity cannot be excluded. No mass or hydronephrosis visualized. Bladder: Appears normal for degree of bladder distention. Other: None. IMPRESSION: 1. Mild bilateral renal increased echogenicity cannot be excluded. No acute renal abnormality. No hydronephrosis. No bladder distention. 2. 4.8 cm simple right renal cyst. Electronically Signed   By: Marcello Moores  Register   On: 05/07/2020 09:22   CT BIOPSY  Result Date: 05/08/2020 INDICATION: 49 year old male with acute kidney injury concerning for possible glomerulonephritis. He presents for random renal biopsy. Due to poor visualization on ultrasound, we will proceed with CT-guided biopsy. EXAM: CT-guided random renal biopsy MEDICATIONS: None.  ANESTHESIA/SEDATION: Moderate (conscious) sedation was employed during this procedure. A total of Versed 2 mg and Fentanyl 100 mcg was administered intravenously. Moderate Sedation Time: 26 minutes. The patient's level of consciousness and vital signs were monitored continuously by radiology nursing throughout the procedure under my direct supervision. FLUOROSCOPY TIME:  None COMPLICATIONS: None immediate. PROCEDURE: Informed written consent was obtained from the patient after a thorough discussion of the procedural risks, benefits and alternatives. All questions were addressed. A timeout was performed prior to the initiation of the procedure. The patient was placed prone on the CT gantry. A suitable skin entry site was selected and marked. The skin was sterilely prepped and draped in the standard fashion using chlorhexidine skin prep. Local anesthesia was attained by infiltration with 1% lidocaine. A small dermatotomy was made. Under intermittent CT guidance, a 15 gauge introducer needle was advanced and positioned at the margin of the lower pole cortex of the right kidney. Several a 16 gauge core biopsies were then coaxially obtained using the Bard automated biopsy device. Biopsy specimens were placed in saline and delivered to pathology for further analysis. As the introducer needle was removed, the biopsy tract was embolized with a Gel-Foam slurry. Post biopsy CT imaging demonstrates no evidence of immediate complication. IMPRESSION: Technically successful CT-guided random core biopsy of the lower pole cortex of the left kidney. Electronically Signed   By: Jacqulynn Cadet M.D.   On: 05/08/2020 12:22     Scheduled Meds: . benazepril  20 mg Oral Daily  . enoxaparin (LOVENOX) injection  40 mg Subcutaneous Q12H  . furosemide  60 mg Intravenous BID  . methylPREDNISolone (SOLU-MEDROL) injection  125 mg Intravenous Daily  . simvastatin  20 mg Oral q1800  . sodium chloride flush  3 mL Intravenous Q12H    Continuous Infusions: . sodium chloride       LOS: 2 days     Enzo Bi, MD [facility] If 7PM-7AM, please contact night-coverage 05/09/2020, 5:01 AM

## 2020-05-08 NOTE — Procedures (Signed)
Interventional Radiology Procedure Note  Procedure: CT guided random renal biopsy, LEFT kidney  Complications: None  Estimated Blood Loss: None  Recommendations: - Bedrest x 6hrs   Signed,  Criselda Peaches, MD

## 2020-05-09 DIAGNOSIS — N049 Nephrotic syndrome with unspecified morphologic changes: Secondary | ICD-10-CM

## 2020-05-09 LAB — BASIC METABOLIC PANEL
Anion gap: 9 (ref 5–15)
BUN: 45 mg/dL — ABNORMAL HIGH (ref 6–20)
CO2: 25 mmol/L (ref 22–32)
Calcium: 7.9 mg/dL — ABNORMAL LOW (ref 8.9–10.3)
Chloride: 104 mmol/L (ref 98–111)
Creatinine, Ser: 2.2 mg/dL — ABNORMAL HIGH (ref 0.61–1.24)
GFR calc Af Amer: 39 mL/min — ABNORMAL LOW (ref >60)
GFR calc non Af Amer: 34 mL/min — ABNORMAL LOW (ref >60)
Glucose, Bld: 160 mg/dL — ABNORMAL HIGH (ref 70–99)
Potassium: 4.2 mmol/L (ref 3.5–5.1)
Sodium: 138 mmol/L (ref 135–145)

## 2020-05-09 LAB — PROTEIN ELECTROPHORESIS, SERUM
A/G Ratio: 0.6 — ABNORMAL LOW (ref 0.7–1.7)
Albumin ELP: 1.6 g/dL — ABNORMAL LOW (ref 2.9–4.4)
Alpha-1-Globulin: 0.2 g/dL (ref 0.0–0.4)
Alpha-2-Globulin: 1.5 g/dL — ABNORMAL HIGH (ref 0.4–1.0)
Beta Globulin: 0.9 g/dL (ref 0.7–1.3)
Gamma Globulin: 0.3 g/dL — ABNORMAL LOW (ref 0.4–1.8)
Globulin, Total: 2.9 g/dL (ref 2.2–3.9)
Total Protein ELP: 4.5 g/dL — ABNORMAL LOW (ref 6.0–8.5)

## 2020-05-09 LAB — MPO/PR-3 (ANCA) ANTIBODIES
ANCA Proteinase 3: <3.5 U/mL (ref 0.0–3.5)
Myeloperoxidase Abs: <9 U/mL (ref 0.0–9.0)

## 2020-05-09 LAB — CBC
HCT: 45.2 % (ref 39.0–52.0)
Hemoglobin: 16.2 g/dL (ref 13.0–17.0)
MCH: 31 pg (ref 26.0–34.0)
MCHC: 35.8 g/dL (ref 30.0–36.0)
MCV: 86.4 fL (ref 80.0–100.0)
Platelets: 257 10*3/uL (ref 150–400)
RBC: 5.23 MIL/uL (ref 4.22–5.81)
RDW: 12.7 % (ref 11.5–15.5)
WBC: 16.8 10*3/uL — ABNORMAL HIGH (ref 4.0–10.5)
nRBC: 0 % (ref 0.0–0.2)

## 2020-05-09 LAB — LIPID PANEL
Cholesterol: 351 mg/dL — ABNORMAL HIGH (ref 0–200)
HDL: 46 mg/dL (ref >40)
LDL Cholesterol: UNDETERMINED mg/dL (ref 0–99)
Total CHOL/HDL Ratio: 7.6 RATIO
Triglycerides: 561 mg/dL — ABNORMAL HIGH (ref <150)
VLDL: UNDETERMINED mg/dL (ref 0–40)

## 2020-05-09 LAB — MAGNESIUM: Magnesium: 2.5 mg/dL — ABNORMAL HIGH (ref 1.7–2.4)

## 2020-05-09 LAB — ECHOCARDIOGRAM COMPLETE
Area-P 1/2: 3.85 cm2
Height: 70 in
S' Lateral: 2.46 cm
Weight: 4895.98 oz

## 2020-05-09 LAB — LDL CHOLESTEROL, DIRECT: Direct LDL: 150.7 mg/dL — ABNORMAL HIGH (ref 0–99)

## 2020-05-09 MED ORDER — SIMVASTATIN 40 MG PO TABS
40.0000 mg | ORAL_TABLET | Freq: Every day | ORAL | Status: DC
  Administered 2020-05-09 – 2020-05-10 (×2): 40 mg via ORAL
  Filled 2020-05-09 (×3): qty 1

## 2020-05-09 NOTE — Progress Notes (Signed)
PROGRESS NOTE    Russell Grant  ZHY:[id number] DOB: 07-Oct-1970 DOA: 05/07/2020 PCP: Patient, No Pcp Per    Assessment & Plan:   Principal Problem:   AKI (acute kidney injury) ([place]) Active Problems:   Essential hypertension   Class 2 severe obesity due to excess calories with serious comorbidity and body mass index (BMI) of 38.0 to 38.9 in adult [facility])   History of gout   Anasarca    Russell Grant is an 49 y.o. Caucasian male with PMH significant for HTN and obesity was in his usual state of health until last month when he noted sudden onset edema of his lower extremities.  Was seen in the ED and started on oral Lasix with recommendations to follow-up with renal.  Patient has not been able to see a nephrologist as an outpatient yet and he continues to gain weight.  Patient states he has swelling up to his buttocks and he is finding it increasingly difficult to walk around due to fatigue and leg aching.   Anasarca most likely secondary to nephrotic syndrome Patient with significant proteinuria (>13g) and anasarca that developed over the past month. --nephrology consulted --Serologic work up: ANA negative, anti-GBM negative, hemoglobin A1c within normal limits, viral hepatitis screen negative, HIV negative, ANCA negative, SPEP/UPEP negative. --kidney biopsy 9/1 PLAN: --continue high-dose solumedrol 125 mg daily --continue diuresis with IV lasix 60 BID, per nephrology --Strict I/O --2L fluid restriction  Acute kidney disease --nephrology consulted --kidney biopsy 9/1 PLAN: --continue IV lasix as above --continue home benazepril, per nephrology  HTN --BP trending up --continue home benazepril and hold HCTZ, per nephrology --IV lasix as above  HLD --LDL direct 150.  Already on simvastatin 20 mg at home. --increase simvastatin to 40 mg daily  Hypertriglyceridemia --triglycerides 561, already on fish oil at home.   DVT prophylaxis: Lovenox SQ Code Status: Full code    Family Communication: wife updated at bedside today 05/09/20  Status is: inpatient Dispo:   The patient is from: home Anticipated d/c is to: home Anticipated d/c date is: 2 days Patient currently is not medically stable to d/c due to: still needing IV diuresis, managed by nephrology   Subjective and Interval History:  Pt reported doing well, walked the halls.  Swelling returned in his right arm, likely related to IV.   Objective: Vitals:   05/08/20 1457 05/08/20 2043 05/09/20 0457 05/09/20 1202  BP: 136/79 (!) 152/85 140/79 (!) 169/81  Pulse: 88 92 95 91  Resp: 12 16 16 18   Temp: 98.4 F (36.9 C) 98.9 F (37.2 C) 97.6 F (36.4 C) 97.9 F (36.6 C)  TempSrc: Oral Oral    SpO2: 96% 93% 94% 95%  Weight:   136.1 kg   Height:        Intake/Output Summary (Last 24 hours) at 05/09/2020 1629 Last data filed at 05/09/2020 0509 Gross per 24 hour  Intake --  Output 400 ml  Net -400 ml   Filed Weights   05/07/20 2321 05/08/20 0500 05/09/20 0457  Weight: (!) 138.8 kg (!) 138.8 kg 136.1 kg    Examination:   Constitutional: NAD, AAOx3 HEENT: conjunctivae and lids normal, EOMI CV: RRR no M,R,G. Distal pulses +2.  No cyanosis.   RESP: CTA B/L, normal respiratory effort  GI: +BS, NTND Extremities: generalized swelling in BLE.  More swelling in right arm and hand. SKIN: warm, dry and intact Neuro: II - XII grossly intact.  Sensation intact Psych: Normal mood and affect.  Appropriate judgement and reason   Data Reviewed: I have personally reviewed following labs and imaging studies  CBC: Recent Labs  Lab 05/06/20 2017 05/07/20 0910 05/08/20 0629 05/09/20 0516  WBC 10.2 7.9 7.6 16.8*  HGB 14.4 14.9 14.6 16.2  HCT 42.4 44.4 44.0 45.2  MCV 90.6 90.2 91.1 86.4  PLT 186 196 180 967   Basic Metabolic Panel: Recent Labs  Lab 05/06/20 2017 05/07/20 0734 05/07/20 0910 05/08/20 0629 05/09/20 0516  NA 141 139  --  139 138  K 3.8 4.1  --  4.8 4.2  CL 104 108  --  105 104   CO2 29 25  --  28 25  GLUCOSE 108* 108*  --  117* 160*  BUN 31* 30*  --  31* 45*  CREATININE 1.72* 1.65* 1.61* 1.57* 2.20*  CALCIUM 7.4* 7.7*  --  7.7* 7.9*  MG  --  2.9*  --   --  2.5*   GFR: Estimated Creatinine Clearance: 55.8 mL/min (A) (by C-G formula based on SCr of 2.2 mg/dL (H)). Liver Function Tests: Recent Labs  Lab 05/07/20 0734  AST 25  ALT 15  ALKPHOS 60  BILITOT 0.9  PROT 5.5*  ALBUMIN 2.0*   No results for input(s): LIPASE, AMYLASE in the last 168 hours. No results for input(s): AMMONIA in the last 168 hours. Coagulation Profile: Recent Labs  Lab 05/07/20 1350 05/08/20 0629  INR 0.9 1.0   Cardiac Enzymes: No results for input(s): CKTOTAL, CKMB, CKMBINDEX, TROPONINI in the last 168 hours. BNP (last 3 results) No results for input(s): PROBNP in the last 8760 hours. HbA1C: Recent Labs    05/07/20 0922  HGBA1C 5.7*   CBG: No results for input(s): GLUCAP in the last 168 hours. Lipid Profile: Recent Labs    05/09/20 0516  CHOL 351*  HDL 46  LDLCALC UNABLE TO CALCULATE IF TRIGLYCERIDE OVER 400 mg/dL  TRIG 561*  CHOLHDL 7.6   Thyroid Function Tests: No results for input(s): TSH, T4TOTAL, FREET4, T3FREE, THYROIDAB in the last 72 hours. Anemia Panel: No results for input(s): VITAMINB12, FOLATE, FERRITIN, TIBC, IRON, RETICCTPCT in the last 72 hours. Sepsis Labs: No results for input(s): PROCALCITON, LATICACIDVEN in the last 168 hours.  Recent Results (from the past 240 hour(s))  SARS Coronavirus 2 by RT PCR (hospital order, performed in [facility] hospital lab) Nasopharyngeal Nasopharyngeal Swab     Status: None   Collection Time: 05/07/20  8:12 AM   Specimen: Nasopharyngeal Swab  Result Value Ref Range Status   SARS Coronavirus 2 NEGATIVE NEGATIVE Final    Comment: (NOTE) SARS-CoV-2 target nucleic acids are NOT DETECTED.  The SARS-CoV-2 RNA is generally detectable in upper and lower respiratory specimens during the acute phase of infection.  The lowest concentration of SARS-CoV-2 viral copies this assay can detect is 250 copies / mL. A negative result does not preclude SARS-CoV-2 infection and should not be used as the sole basis for treatment or other patient management decisions.  A negative result may occur with improper specimen collection / handling, submission of specimen other than nasopharyngeal swab, presence of viral mutation(s) within the areas targeted by this assay, and inadequate number of viral copies (<250 copies / mL). A negative result must be combined with clinical observations, patient history, and epidemiological information.  Fact Sheet for Patients:   StrictlyIdeas.no  Fact Sheet for Healthcare Providers: BankingDealers.co.za  This test is not yet approved or  cleared by the [place] FDA and has been  authorized for detection and/or diagnosis of SARS-CoV-2 by FDA under an Emergency Use Authorization (EUA).  This EUA will remain in effect (meaning this test can be used) for the duration of the COVID-19 declaration under Section 564(b)(1) of the Act, 21 U.S.C. section 360bbb-3(b)(1), unless the authorization is terminated or revoked sooner.  Performed at [facility], [address] [place], [place] [address]       Radiology Studies: CT BIOPSY  Result Date: May 16, 2020 INDICATION: 49 year old male with acute kidney injury concerning for possible glomerulonephritis. He presents for random renal biopsy. Due to poor visualization on ultrasound, we will proceed with CT-guided biopsy. EXAM: CT-guided random renal biopsy MEDICATIONS: None. ANESTHESIA/SEDATION: Moderate (conscious) sedation was employed during this procedure. A total of Versed 2 mg and Fentanyl 100 mcg was administered intravenously. Moderate Sedation Time: 26 minutes. The patient's level of consciousness and vital signs were monitored continuously by radiology nursing throughout  the procedure under my direct supervision. FLUOROSCOPY TIME:  None COMPLICATIONS: None immediate. PROCEDURE: Informed written consent was obtained from the patient after a thorough discussion of the procedural risks, benefits and alternatives. All questions were addressed. A timeout was performed prior to the initiation of the procedure. The patient was placed prone on the CT gantry. A suitable skin entry site was selected and marked. The skin was sterilely prepped and draped in the standard fashion using chlorhexidine skin prep. Local anesthesia was attained by infiltration with 1% lidocaine. A small dermatotomy was made. Under intermittent CT guidance, a 15 gauge introducer needle was advanced and positioned at the margin of the lower pole cortex of the right kidney. Several a 16 gauge core biopsies were then coaxially obtained using the Bard automated biopsy device. Biopsy specimens were placed in saline and delivered to pathology for further analysis. As the introducer needle was removed, the biopsy tract was embolized with a Gel-Foam slurry. Post biopsy CT imaging demonstrates no evidence of immediate complication. IMPRESSION: Technically successful CT-guided random core biopsy of the lower pole cortex of the left kidney. Electronically Signed   By: Jacqulynn Cadet M.D.   On: May 16, 2020 12:22   ECHOCARDIOGRAM COMPLETE  Result Date: 05/09/2020    ECHOCARDIOGRAM REPORT   Patient Name:   Russell Grant Date of Exam: 16-May-2020 Medical Rec #:  [id number]       Height:       70.0 in Accession #:    [id number]      Weight:       306.0 lb Date of Birth:  06-Sep-1971      BSA:          2.501 m Patient Age:    83 years        BP:           136/79 mmHg Patient Gender: M               HR:           86 bpm. Exam Location:  [facility] Procedure: 2D Echo, Cardiac Doppler, Color Doppler and Intracardiac            Opacification Agent Indications:     I50.9 Congestive heart failure  History:         Patient has no prior history  of Echocardiogram examinations.                  Risk Factors:Obesity, Hypertension and Dyslipidemia. Chronic  kidney disease.  Sonographer:     Wilford Sports Rodgers-Jones Referring Phys:  [id number] [place] Diagnosing Phys: Kate Sable MD IMPRESSIONS  1. Left ventricular ejection fraction, by estimation, is 65 to 70%. The left ventricle has normal function. The left ventricle has no regional wall motion abnormalities. There is mild left ventricular hypertrophy. Left ventricular diastolic parameters were normal.  2. Right ventricular systolic function is normal. The right ventricular size is normal.  3. The mitral valve is normal in structure. No evidence of mitral valve regurgitation. No evidence of mitral stenosis.  4. The aortic valve is normal in structure. Aortic valve regurgitation is not visualized. No aortic stenosis is present.  5. The inferior vena cava is normal in size with greater than 50% respiratory variability, suggesting right atrial pressure of 3 mmHg. FINDINGS  Left Ventricle: Left ventricular ejection fraction, by estimation, is 65 to 70%. The left ventricle has normal function. The left ventricle has no regional wall motion abnormalities. Definity contrast agent was given IV to delineate the left ventricular  endocardial borders. The left ventricular internal cavity size was normal in size. There is mild left ventricular hypertrophy. Left ventricular diastolic parameters were normal. Right Ventricle: The right ventricular size is normal. No increase in right ventricular wall thickness. Right ventricular systolic function is normal. Left Atrium: Left atrial size was normal in size. Right Atrium: Right atrial size was normal in size. Pericardium: There is no evidence of pericardial effusion. Mitral Valve: The mitral valve is normal in structure. Normal mobility of the mitral valve leaflets. No evidence of mitral valve regurgitation. No evidence of mitral valve stenosis.  Tricuspid Valve: The tricuspid valve is normal in structure. Tricuspid valve regurgitation is not demonstrated. No evidence of tricuspid stenosis. Aortic Valve: The aortic valve is normal in structure. Aortic valve regurgitation is not visualized. No aortic stenosis is present. Pulmonic Valve: The pulmonic valve was not well visualized. Pulmonic valve regurgitation is not visualized. No evidence of pulmonic stenosis. Aorta: The aortic root is normal in size and structure. Venous: The inferior vena cava is normal in size with greater than 50% respiratory variability, suggesting right atrial pressure of 3 mmHg. IAS/Shunts: No atrial level shunt detected by color flow Doppler.  LEFT VENTRICLE PLAX 2D LVIDd:         4.51 cm  Diastology LVIDs:         2.46 cm  LV e' lateral:   12.10 cm/s LV PW:         1.19 cm  LV E/e' lateral: 6.4 LV IVS:        1.18 cm  LV e' medial:    8.59 cm/s LVOT diam:     1.90 cm  LV E/e' medial:  9.0 LV SV:         45 LV SV Index:   18 LVOT Area:     2.84 cm  RIGHT VENTRICLE             IVC RV Basal diam:  3.72 cm     IVC diam: 1.18 cm RV S prime:     18.60 cm/s TAPSE (M-mode): 2.2 cm LEFT ATRIUM             Index       RIGHT ATRIUM           Index LA diam:        4.10 cm 1.64 cm/m  RA Area:     11.10 cm LA Vol (A2C):   56.3 ml  22.51 ml/m RA Volume:   25.90 ml  10.36 ml/m LA Vol (A4C):   41.6 ml 16.63 ml/m LA Biplane Vol: 51.8 ml 20.71 ml/m  AORTIC VALVE LVOT Vmax:   99.90 cm/s LVOT Vmean:  84.900 cm/s LVOT VTI:    0.160 m  AORTA Ao Root diam: 3.30 cm MITRAL VALVE MV Area (PHT): 3.85 cm    SHUNTS MV Decel Time: 197 msec    Systemic VTI:  0.16 m MV E velocity: 77.10 cm/s  Systemic Diam: 1.90 cm MV A velocity: 83.90 cm/s MV E/A ratio:  0.92 Kate Sable MD Electronically signed by Kate Sable MD Signature Date/Time: 05/09/2020/11:27:52 AM    Final      Scheduled Meds: . benazepril  20 mg Oral Daily  . enoxaparin (LOVENOX) injection  40 mg Subcutaneous Q12H  . furosemide  60  mg Intravenous BID  . methylPREDNISolone (SOLU-MEDROL) injection  125 mg Intravenous Daily  . simvastatin  40 mg Oral q1800  . sodium chloride flush  3 mL Intravenous Q12H   Continuous Infusions: . sodium chloride       LOS: 2 days     Enzo Bi, MD [facility] If 7PM-7AM, please contact night-coverage 05/09/2020, 4:29 PM

## 2020-05-09 NOTE — Progress Notes (Signed)
[facility]  ROUNDING NOTE   Subjective:   Creatinine 2.2 (1.57) CT guided renal biopsy yesterday.  Wife at bedside.   UOP 1213mL - IV furosemide 60mg  q12  Objective:  Vital signs in last 24 hours:  Temp:  [97.6 F (36.4 C)-98.9 F (37.2 C)] 97.9 F (36.6 C) (09/02 1202) Pulse Rate:  [88-95] 91 (09/02 1202) Resp:  [12-18] 18 (09/02 1202) BP: (136-169)/(79-85) 169/81 (09/02 1202) SpO2:  [93 %-96 %] 95 % (09/02 1202) Weight:  [136.1 kg] 136.1 kg (09/02 0457)  Weight change: -2.721 kg Filed Weights   05/07/20 2321 05/08/20 0500 05/09/20 0457  Weight: (!) 138.8 kg (!) 138.8 kg 136.1 kg    Intake/Output: I/O last 3 completed shifts: In: 100 [P.O.:100] Out: 1650 [Urine:1650]   Intake/Output this shift:  No intake/output data recorded.  Physical Exam: General: NAD,   Head: Normocephalic, atraumatic. Moist oral mucosal membranes  Eyes: Anicteric, PERRL  Neck: Supple, trachea midline  Lungs:  Bilateral crackles  Heart: Regular rate and rhythm  Abdomen:  Soft, nontender,   Extremities:  ++ peripheral edema.  Neurologic: Nonfocal, moving all four extremities  Skin: No lesions        Basic Metabolic Panel: Recent Labs  Lab 05/06/20 2017 05/06/20 2017 05/07/20 0734 05/07/20 0910 05/08/20 0629 05/09/20 0516  NA 141  --  139  --  139 138  K 3.8  --  4.1  --  4.8 4.2  CL 104  --  108  --  105 104  CO2 29  --  25  --  28 25  GLUCOSE 108*  --  108*  --  117* 160*  BUN 31*  --  30*  --  31* 45*  CREATININE 1.72*  --  1.65* 1.61* 1.57* 2.20*  CALCIUM 7.4*   < > 7.7*  --  7.7* 7.9*  MG  --   --  2.9*  --   --  2.5*   < > = values in this interval not displayed.    Liver Function Tests: Recent Labs  Lab 05/07/20 0734  AST 25  ALT 15  ALKPHOS 60  BILITOT 0.9  PROT 5.5*  ALBUMIN 2.0*   No results for input(s): LIPASE, AMYLASE in the last 168 hours. No results for input(s): AMMONIA in the last 168 hours.  CBC: Recent Labs  Lab  05/06/20 2017 05/07/20 0910 05/08/20 0629 05/09/20 0516  WBC 10.2 7.9 7.6 16.8*  HGB 14.4 14.9 14.6 16.2  HCT 42.4 44.4 44.0 45.2  MCV 90.6 90.2 91.1 86.4  PLT 186 196 180 257    Cardiac Enzymes: No results for input(s): CKTOTAL, CKMB, CKMBINDEX, TROPONINI in the last 168 hours.  BNP: Invalid input(s): POCBNP  CBG: No results for input(s): GLUCAP in the last 168 hours.  Microbiology: Results for orders placed or performed during the hospital encounter of 05/07/20  SARS Coronavirus 2 by RT PCR (hospital order, performed in [facility] hospital lab) Nasopharyngeal Nasopharyngeal Swab     Status: None   Collection Time: 05/07/20  8:12 AM   Specimen: Nasopharyngeal Swab  Result Value Ref Range Status   SARS Coronavirus 2 NEGATIVE NEGATIVE Final    Comment: (NOTE) SARS-CoV-2 target nucleic acids are NOT DETECTED.  The SARS-CoV-2 RNA is generally detectable in upper and lower respiratory specimens during the acute phase of infection. The lowest concentration of SARS-CoV-2 viral copies this assay can detect is 250 copies / mL. A negative result does not preclude SARS-CoV-2 infection  and should not be used as the sole basis for treatment or other patient management decisions.  A negative result may occur with improper specimen collection / handling, submission of specimen other than nasopharyngeal swab, presence of viral mutation(s) within the areas targeted by this assay, and inadequate number of viral copies (<250 copies / mL). A negative result must be combined with clinical observations, patient history, and epidemiological information.  Fact Sheet for Patients:   StrictlyIdeas.no  Fact Sheet for Healthcare Providers: BankingDealers.co.za  This test is not yet approved or  cleared by the [place] FDA and has been authorized for detection and/or diagnosis of SARS-CoV-2 by FDA under an Emergency Use Authorization (EUA).   This EUA will remain in effect (meaning this test can be used) for the duration of the COVID-19 declaration under Section 564(b)(1) of the Act, 21 U.S.C. section 360bbb-3(b)(1), unless the authorization is terminated or revoked sooner.  Performed at [facility], [address] [place], [place] [address]     Coagulation Studies: Recent Labs    05/07/20 1350 05/08/20 0629  LABPROT 12.2 12.5  INR 0.9 1.0    Urinalysis: Recent Labs    05/06/20 2017  COLORURINE YELLOW*  LABSPEC 1.027  PHURINE 7.0  GLUCOSEU >=500*  HGBUR NEGATIVE  BILIRUBINUR NEGATIVE  KETONESUR NEGATIVE  PROTEINUR >=300*  NITRITE NEGATIVE  LEUKOCYTESUR NEGATIVE      Imaging: CT BIOPSY  Result Date: 05/08/2020 INDICATION: 49 year old male with acute kidney injury concerning for possible glomerulonephritis. He presents for random renal biopsy. Due to poor visualization on ultrasound, we will proceed with CT-guided biopsy. EXAM: CT-guided random renal biopsy MEDICATIONS: None. ANESTHESIA/SEDATION: Moderate (conscious) sedation was employed during this procedure. A total of Versed 2 mg and Fentanyl 100 mcg was administered intravenously. Moderate Sedation Time: 26 minutes. The patient's level of consciousness and vital signs were monitored continuously by radiology nursing throughout the procedure under my direct supervision. FLUOROSCOPY TIME:  None COMPLICATIONS: None immediate. PROCEDURE: Informed written consent was obtained from the patient after a thorough discussion of the procedural risks, benefits and alternatives. All questions were addressed. A timeout was performed prior to the initiation of the procedure. The patient was placed prone on the CT gantry. A suitable skin entry site was selected and marked. The skin was sterilely prepped and draped in the standard fashion using chlorhexidine skin prep. Local anesthesia was attained by infiltration with 1% lidocaine. A small dermatotomy was made. Under  intermittent CT guidance, a 15 gauge introducer needle was advanced and positioned at the margin of the lower pole cortex of the right kidney. Several a 16 gauge core biopsies were then coaxially obtained using the Bard automated biopsy device. Biopsy specimens were placed in saline and delivered to pathology for further analysis. As the introducer needle was removed, the biopsy tract was embolized with a Gel-Foam slurry. Post biopsy CT imaging demonstrates no evidence of immediate complication. IMPRESSION: Technically successful CT-guided random core biopsy of the lower pole cortex of the left kidney. Electronically Signed   By: Jacqulynn Cadet M.D.   On: 05/08/2020 12:22   ECHOCARDIOGRAM COMPLETE  Result Date: 05/09/2020    ECHOCARDIOGRAM REPORT   Patient Name:   JAVIEN TESCH Date of Exam: 05/08/2020 Medical Rec #:  [id number]       Height:       70.0 in Accession #:    [id number]      Weight:       306.0 lb Date of Birth:  [birth year]  BSA:          2.501 m Patient Age:    54 years        BP:           136/79 mmHg Patient Gender: M               HR:           86 bpm. Exam Location:  [facility] Procedure: 2D Echo, Cardiac Doppler, Color Doppler and Intracardiac            Opacification Agent Indications:     I50.9 Congestive heart failure  History:         Patient has no prior history of Echocardiogram examinations.                  Risk Factors:Obesity, Hypertension and Dyslipidemia. Chronic                  kidney disease.  Sonographer:     Wilford Sports Rodgers-Jones Referring Phys:  [id number] [place] Diagnosing Phys: Kate Sable MD IMPRESSIONS  1. Left ventricular ejection fraction, by estimation, is 65 to 70%. The left ventricle has normal function. The left ventricle has no regional wall motion abnormalities. There is mild left ventricular hypertrophy. Left ventricular diastolic parameters were normal.  2. Right ventricular systolic function is normal. The right ventricular size is normal.  3. The  mitral valve is normal in structure. No evidence of mitral valve regurgitation. No evidence of mitral stenosis.  4. The aortic valve is normal in structure. Aortic valve regurgitation is not visualized. No aortic stenosis is present.  5. The inferior vena cava is normal in size with greater than 50% respiratory variability, suggesting right atrial pressure of 3 mmHg. FINDINGS  Left Ventricle: Left ventricular ejection fraction, by estimation, is 65 to 70%. The left ventricle has normal function. The left ventricle has no regional wall motion abnormalities. Definity contrast agent was given IV to delineate the left ventricular  endocardial borders. The left ventricular internal cavity size was normal in size. There is mild left ventricular hypertrophy. Left ventricular diastolic parameters were normal. Right Ventricle: The right ventricular size is normal. No increase in right ventricular wall thickness. Right ventricular systolic function is normal. Left Atrium: Left atrial size was normal in size. Right Atrium: Right atrial size was normal in size. Pericardium: There is no evidence of pericardial effusion. Mitral Valve: The mitral valve is normal in structure. Normal mobility of the mitral valve leaflets. No evidence of mitral valve regurgitation. No evidence of mitral valve stenosis. Tricuspid Valve: The tricuspid valve is normal in structure. Tricuspid valve regurgitation is not demonstrated. No evidence of tricuspid stenosis. Aortic Valve: The aortic valve is normal in structure. Aortic valve regurgitation is not visualized. No aortic stenosis is present. Pulmonic Valve: The pulmonic valve was not well visualized. Pulmonic valve regurgitation is not visualized. No evidence of pulmonic stenosis. Aorta: The aortic root is normal in size and structure. Venous: The inferior vena cava is normal in size with greater than 50% respiratory variability, suggesting right atrial pressure of 3 mmHg. IAS/Shunts: No atrial  level shunt detected by color flow Doppler.  LEFT VENTRICLE PLAX 2D LVIDd:         4.51 cm  Diastology LVIDs:         2.46 cm  LV e' lateral:   12.10 cm/s LV PW:         1.19 cm  LV E/e' lateral: 6.4 LV IVS:  1.18 cm  LV e' medial:    8.59 cm/s LVOT diam:     1.90 cm  LV E/e' medial:  9.0 LV SV:         45 LV SV Index:   18 LVOT Area:     2.84 cm  RIGHT VENTRICLE             IVC RV Basal diam:  3.72 cm     IVC diam: 1.18 cm RV S prime:     18.60 cm/s TAPSE (M-mode): 2.2 cm LEFT ATRIUM             Index       RIGHT ATRIUM           Index LA diam:        4.10 cm 1.64 cm/m  RA Area:     11.10 cm LA Vol (A2C):   56.3 ml 22.51 ml/m RA Volume:   25.90 ml  10.36 ml/m LA Vol (A4C):   41.6 ml 16.63 ml/m LA Biplane Vol: 51.8 ml 20.71 ml/m  AORTIC VALVE LVOT Vmax:   99.90 cm/s LVOT Vmean:  84.900 cm/s LVOT VTI:    0.160 m  AORTA Ao Root diam: 3.30 cm MITRAL VALVE MV Area (PHT): 3.85 cm    SHUNTS MV Decel Time: 197 msec    Systemic VTI:  0.16 m MV E velocity: 77.10 cm/s  Systemic Diam: 1.90 cm MV A velocity: 83.90 cm/s MV E/A ratio:  0.92 Kate Sable MD Electronically signed by Kate Sable MD Signature Date/Time: 05/09/2020/11:27:52 AM    Final      Medications:   . sodium chloride     . benazepril  20 mg Oral Daily  . enoxaparin (LOVENOX) injection  40 mg Subcutaneous Q12H  . furosemide  60 mg Intravenous BID  . methylPREDNISolone (SOLU-MEDROL) injection  125 mg Intravenous Daily  . simvastatin  20 mg Oral q1800  . sodium chloride flush  3 mL Intravenous Q12H   sodium chloride, acetaminophen **OR** acetaminophen, hydrALAZINE, ondansetron **OR** ondansetron (ZOFRAN) IV, polyethylene glycol, sodium chloride flush  Assessment/ Plan:  [name] is a 49 y.o. white male with hypertension, hyperlipidemia, allergies who was admitted to [facility] on 05/07/2020 for AKI (acute kidney injury) ([place]) [N17.9]  1. Acute kidney injury: creatinine trending up.  2. Proteinuria: 3. Anasarca 4.  Hypoalbuminemia 5. Hypertension 6. Hyperlipidemia  History suggestive of glomerulonephritis. Over 13 grams of proteinuria Serologic work up: ANA negative, anti-GBM negative, hemoglobin A1c within normal limits, viral hepatitis screen negative, HIV negative, ANCA negative, SPEP/UPEP negative. - high dose steroids. Will need to be discharged with PO steroids.  - benazepril - IV furosemide - Kidney biopsy on 9/1. Pending pathology report.  - Increase simvastatin dose    LOS: 2 Chelcey Caputo 9/2/202112:59 PM

## 2020-05-09 NOTE — Progress Notes (Signed)
Mobility Specialist - Progress Note   05/09/20 1447  Mobility  Activity Ambulated in room;Ambulated in hall  Level of Assistance Independent  Assistive Device None  Distance Ambulated (ft) 600 ft  Mobility Response Tolerated well  Mobility performed by Mobility specialist  $Mobility charge 1 Mobility    Pre-mobility: 102 HR, 150/82 BP, 95% SpO2 Post-mobility: 103 HR, 144/88 BP, 98% SpO2   Pt laying in bed w/ wife present in room upon arrival. Pt agreed to mobility session. Pt independent t/o session. Pt ambulated 600' total in room and hallway. CGA utilized for safety precautions. Pt maintained steady gait, had no LOB. Overall, pt tolerated session very well. Pt left sitting EOB w/ wife present in room. Call bell and phone placed in reach.     Marisel Tostenson Mobility Specialist  05/09/20, 2:50 PM

## 2020-05-10 ENCOUNTER — Inpatient Hospital Stay: Payer: BC Managed Care – PPO

## 2020-05-10 LAB — BASIC METABOLIC PANEL
Anion gap: 7 (ref 5–15)
BUN: 76 mg/dL — ABNORMAL HIGH (ref 6–20)
CO2: 24 mmol/L (ref 22–32)
Calcium: 7.7 mg/dL — ABNORMAL LOW (ref 8.9–10.3)
Chloride: 105 mmol/L (ref 98–111)
Creatinine, Ser: 2.34 mg/dL — ABNORMAL HIGH (ref 0.61–1.24)
GFR calc Af Amer: 36 mL/min — ABNORMAL LOW (ref >60)
GFR calc non Af Amer: 31 mL/min — ABNORMAL LOW (ref >60)
Glucose, Bld: 142 mg/dL — ABNORMAL HIGH (ref 70–99)
Potassium: 4.5 mmol/L (ref 3.5–5.1)
Sodium: 136 mmol/L (ref 135–145)

## 2020-05-10 LAB — CBC
HCT: 40.9 % (ref 39.0–52.0)
Hemoglobin: 14.4 g/dL (ref 13.0–17.0)
MCH: 30.4 pg (ref 26.0–34.0)
MCHC: 35.2 g/dL (ref 30.0–36.0)
MCV: 86.5 fL (ref 80.0–100.0)
Platelets: 255 10*3/uL (ref 150–400)
RBC: 4.73 MIL/uL (ref 4.22–5.81)
RDW: 12.8 % (ref 11.5–15.5)
WBC: 23.9 10*3/uL — ABNORMAL HIGH (ref 4.0–10.5)
nRBC: 0 % (ref 0.0–0.2)

## 2020-05-10 LAB — ALBUMIN: Albumin: 1.8 g/dL — ABNORMAL LOW (ref 3.5–5.0)

## 2020-05-10 LAB — MAGNESIUM: Magnesium: 2.7 mg/dL — ABNORMAL HIGH (ref 1.7–2.4)

## 2020-05-10 MED ORDER — ALBUMIN HUMAN 25 % IV SOLN
25.0000 g | Freq: Three times a day (TID) | INTRAVENOUS | Status: DC
  Administered 2020-05-10 – 2020-05-11 (×3): 25 g via INTRAVENOUS
  Filled 2020-05-10 (×5): qty 100

## 2020-05-10 MED ORDER — PREDNISONE 20 MG PO TABS
80.0000 mg | ORAL_TABLET | Freq: Every day | ORAL | Status: DC
  Administered 2020-05-11: 80 mg via ORAL
  Filled 2020-05-10: qty 4

## 2020-05-10 MED ORDER — FUROSEMIDE 10 MG/ML IJ SOLN
20.0000 mg | Freq: Two times a day (BID) | INTRAMUSCULAR | Status: DC
  Administered 2020-05-10 – 2020-05-11 (×2): 20 mg via INTRAVENOUS
  Filled 2020-05-10 (×2): qty 4

## 2020-05-10 MED ORDER — ASPIRIN 81 MG PO CHEW
81.0000 mg | CHEWABLE_TABLET | Freq: Every day | ORAL | Status: DC
  Administered 2020-05-10: 81 mg via ORAL
  Filled 2020-05-10: qty 1

## 2020-05-10 NOTE — Progress Notes (Signed)
[facility]  ROUNDING NOTE   Subjective:   Russell Grant is an 49 y.o.malewith PMH significant for HTN and obesity came to ED with sudden onset edema of his lower extremities. He got started on diuretics and consulted Nephrology for further management of his proteinuria.  Renal biopsy was done on 05/08/2020, result pending.  Objective:  Vital signs in last 24 hours:  Temp:  [98.3 F (36.8 C)-98.6 F (37 C)] 98.3 F (36.8 C) (09/03 0418) Pulse Rate:  [89-106] 89 (09/03 0418) Resp:  [20] 20 (09/03 0418) BP: (142-152)/(77-84) 152/84 (09/03 0418) SpO2:  [95 %-96 %] 95 % (09/03 0418) Weight:  [138.9 kg] 138.9 kg (09/03 0418)  Weight change: 2.821 kg Filed Weights   05/08/20 0500 05/09/20 0457 05/10/20 0418  Weight: (!) 138.8 kg 136.1 kg (!) 138.9 kg    Intake/Output: I/O last 3 completed shifts: In: 0  Out: 850 [Urine:850]   Intake/Output this shift:  Total I/O In: -  Out: 200 [Urine:200]  Physical Exam: General: Resting in bed, in no acute distress  Head: Normocephalic, facial edema+ , appears flushed   Eyes: Sclerae and conjunctivae clear  Neck: Supple, trachea at the midline  Lungs:  Diminished at the bases  Heart: S1S2,no rub or gallop  Abdomen:  Soft, nontender, non distended  Extremities:  Rt Hand with 1+ edema, Trace edema on lower extremities   Neurologic: Alert, oriented x4  Skin: No acute lesions or rashes        Basic Metabolic Panel: Recent Labs  Lab 05/06/20 2017 05/06/20 2017 05/07/20 0734 05/07/20 0734 05/07/20 0910 05/08/20 0629 05/09/20 0516 05/10/20 0533  NA 141  --  139  --   --  139 138 136  K 3.8  --  4.1  --   --  4.8 4.2 4.5  CL 104  --  108  --   --  105 104 105  CO2 29  --  25  --   --  28 25 24   GLUCOSE 108*  --  108*  --   --  117* 160* 142*  BUN 31*  --  30*  --   --  31* 45* 76*  CREATININE 1.72*   < > 1.65*  --  1.61* 1.57* 2.20* 2.34*  CALCIUM 7.4*   < > 7.7*   < >  --  7.7* 7.9* 7.7*  MG  --   --  2.9*  --    --   --  2.5* 2.7*   < > = values in this interval not displayed.    Liver Function Tests: Recent Labs  Lab 05/07/20 0734 05/10/20 0533  AST 25  --   ALT 15  --   ALKPHOS 60  --   BILITOT 0.9  --   PROT 5.5*  --   ALBUMIN 2.0* 1.8*   No results for input(s): LIPASE, AMYLASE in the last 168 hours. No results for input(s): AMMONIA in the last 168 hours.  CBC: Recent Labs  Lab 05/06/20 2017 05/07/20 0910 05/08/20 0629 05/09/20 0516 05/10/20 0533  WBC 10.2 7.9 7.6 16.8* 23.9*  HGB 14.4 14.9 14.6 16.2 14.4  HCT 42.4 44.4 44.0 45.2 40.9  MCV 90.6 90.2 91.1 86.4 86.5  PLT 186 196 180 257 255    Cardiac Enzymes: No results for input(s): CKTOTAL, CKMB, CKMBINDEX, TROPONINI in the last 168 hours.  BNP: Invalid input(s): POCBNP  CBG: No results for input(s): GLUCAP in the last 168 hours.  Microbiology: Results  for orders placed or performed during the hospital encounter of 05/07/20  SARS Coronavirus 2 by RT PCR (hospital order, performed in [facility] hospital lab) Nasopharyngeal Nasopharyngeal Swab     Status: None   Collection Time: 05/07/20  8:12 AM   Specimen: Nasopharyngeal Swab  Result Value Ref Range Status   SARS Coronavirus 2 NEGATIVE NEGATIVE Final    Comment: (NOTE) SARS-CoV-2 target nucleic acids are NOT DETECTED.  The SARS-CoV-2 RNA is generally detectable in upper and lower respiratory specimens during the acute phase of infection. The lowest concentration of SARS-CoV-2 viral copies this assay can detect is 250 copies / mL. A negative result does not preclude SARS-CoV-2 infection and should not be used as the sole basis for treatment or other patient management decisions.  A negative result may occur with improper specimen collection / handling, submission of specimen other than nasopharyngeal swab, presence of viral mutation(s) within the areas targeted by this assay, and inadequate number of viral copies (<250 copies / mL). A negative result must  be combined with clinical observations, patient history, and epidemiological information.  Fact Sheet for Patients:   StrictlyIdeas.no  Fact Sheet for Healthcare Providers: BankingDealers.co.za  This test is not yet approved or  cleared by the [place] FDA and has been authorized for detection and/or diagnosis of SARS-CoV-2 by FDA under an Emergency Use Authorization (EUA).  This EUA will remain in effect (meaning this test can be used) for the duration of the COVID-19 declaration under Section 564(b)(1) of the Act, 21 U.S.C. section 360bbb-3(b)(1), unless the authorization is terminated or revoked sooner.  Performed at [facility], [address] [place], [place] [address]     Coagulation Studies: Recent Labs    05/08/20 0629  LABPROT 12.5  INR 1.0    Urinalysis: No results for input(s): COLORURINE, LABSPEC, PHURINE, GLUCOSEU, HGBUR, BILIRUBINUR, KETONESUR, PROTEINUR, UROBILINOGEN, NITRITE, LEUKOCYTESUR in the last 72 hours.  Invalid input(s): APPERANCEUR    Imaging: ECHOCARDIOGRAM COMPLETE  Result Date: 05/09/2020    ECHOCARDIOGRAM REPORT   Patient Name:   Russell Grant Date of Exam: 05/08/2020 Medical Rec #:  [id number]       Height:       70.0 in Accession #:    [id number]      Weight:       306.0 lb Date of Birth:  [birth year]      BSA:          2.501 m Patient Age:    33 years        BP:           136/79 mmHg Patient Gender: M               HR:           86 bpm. Exam Location:  [facility] Procedure: 2D Echo, Cardiac Doppler, Color Doppler and Intracardiac            Opacification Agent Indications:     I50.9 Congestive heart failure  History:         Patient has no prior history of Echocardiogram examinations.                  Risk Factors:Obesity, Hypertension and Dyslipidemia. Chronic                  kidney disease.  Sonographer:     Wilford Sports Rodgers-Jones Referring Phys:  [id number] [place] Diagnosing Phys:  Kate Sable MD IMPRESSIONS  1. Left  ventricular ejection fraction, by estimation, is 65 to 70%. The left ventricle has normal function. The left ventricle has no regional wall motion abnormalities. There is mild left ventricular hypertrophy. Left ventricular diastolic parameters were normal.  2. Right ventricular systolic function is normal. The right ventricular size is normal.  3. The mitral valve is normal in structure. No evidence of mitral valve regurgitation. No evidence of mitral stenosis.  4. The aortic valve is normal in structure. Aortic valve regurgitation is not visualized. No aortic stenosis is present.  5. The inferior vena cava is normal in size with greater than 50% respiratory variability, suggesting right atrial pressure of 3 mmHg. FINDINGS  Left Ventricle: Left ventricular ejection fraction, by estimation, is 65 to 70%. The left ventricle has normal function. The left ventricle has no regional wall motion abnormalities. Definity contrast agent was given IV to delineate the left ventricular  endocardial borders. The left ventricular internal cavity size was normal in size. There is mild left ventricular hypertrophy. Left ventricular diastolic parameters were normal. Right Ventricle: The right ventricular size is normal. No increase in right ventricular wall thickness. Right ventricular systolic function is normal. Left Atrium: Left atrial size was normal in size. Right Atrium: Right atrial size was normal in size. Pericardium: There is no evidence of pericardial effusion. Mitral Valve: The mitral valve is normal in structure. Normal mobility of the mitral valve leaflets. No evidence of mitral valve regurgitation. No evidence of mitral valve stenosis. Tricuspid Valve: The tricuspid valve is normal in structure. Tricuspid valve regurgitation is not demonstrated. No evidence of tricuspid stenosis. Aortic Valve: The aortic valve is normal in structure. Aortic valve regurgitation is not  visualized. No aortic stenosis is present. Pulmonic Valve: The pulmonic valve was not well visualized. Pulmonic valve regurgitation is not visualized. No evidence of pulmonic stenosis. Aorta: The aortic root is normal in size and structure. Venous: The inferior vena cava is normal in size with greater than 50% respiratory variability, suggesting right atrial pressure of 3 mmHg. IAS/Shunts: No atrial level shunt detected by color flow Doppler.  LEFT VENTRICLE PLAX 2D LVIDd:         4.51 cm  Diastology LVIDs:         2.46 cm  LV e' lateral:   12.10 cm/s LV PW:         1.19 cm  LV E/e' lateral: 6.4 LV IVS:        1.18 cm  LV e' medial:    8.59 cm/s LVOT diam:     1.90 cm  LV E/e' medial:  9.0 LV SV:         45 LV SV Index:   18 LVOT Area:     2.84 cm  RIGHT VENTRICLE             IVC RV Basal diam:  3.72 cm     IVC diam: 1.18 cm RV S prime:     18.60 cm/s TAPSE (M-mode): 2.2 cm LEFT ATRIUM             Index       RIGHT ATRIUM           Index LA diam:        4.10 cm 1.64 cm/m  RA Area:     11.10 cm LA Vol (A2C):   56.3 ml 22.51 ml/m RA Volume:   25.90 ml  10.36 ml/m LA Vol (A4C):   41.6 ml 16.63 ml/m LA Biplane Vol: 51.8 ml  20.71 ml/m  AORTIC VALVE LVOT Vmax:   99.90 cm/s LVOT Vmean:  84.900 cm/s LVOT VTI:    0.160 m  AORTA Ao Root diam: 3.30 cm MITRAL VALVE MV Area (PHT): 3.85 cm    SHUNTS MV Decel Time: 197 msec    Systemic VTI:  0.16 m MV E velocity: 77.10 cm/s  Systemic Diam: 1.90 cm MV A velocity: 83.90 cm/s MV E/A ratio:  0.92 Kate Sable MD Electronically signed by Kate Sable MD Signature Date/Time: 05/09/2020/11:27:52 AM    Final      Medications:   . sodium chloride    . albumin human 25 g (05/10/20 1306)   . aspirin  81 mg Oral Daily  . benazepril  20 mg Oral Daily  . enoxaparin (LOVENOX) injection  40 mg Subcutaneous Q12H  . furosemide  20 mg Intravenous BID  . methylPREDNISolone (SOLU-MEDROL) injection  125 mg Intravenous Daily  . simvastatin  40 mg Oral q1800  . sodium  chloride flush  3 mL Intravenous Q12H   sodium chloride, acetaminophen **OR** acetaminophen, hydrALAZINE, ondansetron **OR** ondansetron (ZOFRAN) IV, polyethylene glycol, sodium chloride flush  Assessment/ Plan:  Russell Grant is a 49 y.o. white male with hypertension, hyperlipidemia, allergies who was admitted to [facility] on 05/07/2020 for AKI (acute kidney injury) ([place]) [N17.9]  #  Acute kidney injury Creatinine trending up, today 2.3 Decrease Lasix to 20 mg BID Strict I&O   # Proteinuria,Anasarca, Hypoalbuminemia History suggestive of glomerulonephritis,over 13 grams of proteinuria Serologic work up: ANA negative, anti-GBM negative, hemoglobin A1c within normal limits, viral hepatitis screen negative, HIV negative, ANCA negative, SPEP/UPEP negative High dose steroids. Will need to be discharged with PO steroids.   # Hypertension BP readings at the target with current treatment,on benazepril and Furosemide.HCTZ on hold  #. Hyperlipidemia  Increased simvastatin to 40 mg during this admission  Patient was seen and examined with Crosby Oyster, DNP.  [facility] Nephropathology department this afternoon and discussed case with Dr. 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  Tip lesions are usually primary and treated initially with high dose steroids. Given IV solumedrol for three days. Start PO prednisone 80mg  daily for anticipated 8-12 week taper.  Above plan was discussed and agreed upon on the signing of this note.    LOS: 3 Callan Yontz 9/3/20212:29 PM

## 2020-05-10 NOTE — Progress Notes (Signed)
PROGRESS NOTE    Russell Grant  HYQ:[id number] DOB: Sep 06, 1971 DOA: 05/07/2020 PCP: Patient, No Pcp Per    Assessment & Plan:   Principal Problem:   Nephrotic syndrome Active Problems:   Hypertriglyceridemia   Essential hypertension   Class 2 severe obesity due to excess calories with serious comorbidity and body mass index (BMI) of 38.0 to 38.9 in adult [facility])   History of gout   AKI (acute kidney injury) ([place])   Anasarca    Russell Grant is an 49 y.o. Caucasian male with PMH significant for HTN and obesity was in his usual state of health until last month when he noted sudden onset edema of his lower extremities.  Was seen in the ED and started on oral Lasix with recommendations to follow-up with renal.  Patient has not been able to see a nephrologist as an outpatient yet and he continues to gain weight.  Patient states he has swelling up to his buttocks and he is finding it increasingly difficult to walk around due to fatigue and leg aching.   Anasarca most likely secondary to nephrotic syndrome Patient with significant proteinuria (>13g) and anasarca that developed over the past month. --nephrology consulted --Serologic work up: ANA negative, anti-GBM negative, hemoglobin A1c within normal limits, viral hepatitis screen negative, HIV negative, ANCA negative, SPEP/UPEP negative. --kidney biopsy 9/1 --started on high-dose solumedrol 125 mg daily PLAN: --transitioned to prednisone 80 mg daily today --continue IV lasix 20 mg BID, per nephrology --start IV albumin 25g q8h --Strict I/O --1200 cc fluid restriction  Acute kidney disease --nephrology consulted --kidney biopsy 9/1 --Cr worsening PLAN: --continue diuresis to remove congestion --continue home benazepril, per nephrology  HTN --BP trending up, 140's-160's --continue IV diuresis  --continue home benazepril --Hold HCTZ  HLD --LDL direct 150.  Already on simvastatin 20 mg at home. --continue Simvastatin  40 mg daily  Hypertriglyceridemia --triglycerides 561, already on fish oil at home.   DVT prophylaxis: Lovenox SQ Code Status: Full code  Family Communication: wife updated at bedside today 05/10/20  Status is: inpatient Dispo:   The patient is from: home Anticipated d/c is to: home Anticipated d/c date is: undetermined Patient currently is not medically stable to d/c due to: still needing IV diuresis, managed by nephrology, Cr trending up   Subjective and Interval History:  Pt reported feeling fine, walking the halls.  Not having much urine output despite being on IV lasix.  Cr trending up.   Objective: Vitals:   05/09/20 0457 05/09/20 1202 05/09/20 2108 05/10/20 0418  BP: 140/79 (!) 169/81 (!) 142/77 (!) 152/84  Pulse: 95 91 (!) 106 89  Resp: 16 18 20 20   Temp: 97.6 F (36.4 C) 97.9 F (36.6 C) 98.6 F (37 C) 98.3 F (36.8 C)  TempSrc:   Oral Oral  SpO2: 94% 95% 96% 95%  Weight: 136.1 kg   (!) 138.9 kg  Height:        Intake/Output Summary (Last 24 hours) at 05/10/2020 1638 Last data filed at 05/10/2020 1500 Gross per 24 hour  Intake 100 ml  Output 800 ml  Net -700 ml   Filed Weights   05/08/20 0500 05/09/20 0457 05/10/20 0418  Weight: (!) 138.8 kg 136.1 kg (!) 138.9 kg    Examination:   Constitutional: NAD, AAOx3 HEENT: conjunctivae and lids normal, EOMI CV: RRR no M,R,G. Distal pulses +2.  No cyanosis.   RESP: CTA B/L, normal respiratory effort  GI: +BS, NTND Extremities: generalized swelling in  BLE.  Right arm/hand more swollen than left MSK: normal ROM and strength, no joint enlargement or tenderness of both UE and LE SKIN: warm, dry and intact Neuro: II - XII grossly intact.  Sensation intact Psych: Normal mood and affect.  Appropriate judgement and reason   Data Reviewed: I have personally reviewed following labs and imaging studies  CBC: Recent Labs  Lab 05/06/20 2017 05/07/20 0910 05/08/20 0629 05/09/20 0516 05/10/20 0533  WBC 10.2 7.9  7.6 16.8* 23.9*  HGB 14.4 14.9 14.6 16.2 14.4  HCT 42.4 44.4 44.0 45.2 40.9  MCV 90.6 90.2 91.1 86.4 86.5  PLT 186 196 180 257 163   Basic Metabolic Panel: Recent Labs  Lab 05/06/20 2017 05/06/20 2017 05/07/20 0734 05/07/20 0910 05/08/20 0629 05/09/20 0516 05/10/20 0533  NA 141  --  139  --  139 138 136  K 3.8  --  4.1  --  4.8 4.2 4.5  CL 104  --  108  --  105 104 105  CO2 29  --  25  --  28 25 24   GLUCOSE 108*  --  108*  --  117* 160* 142*  BUN 31*  --  30*  --  31* 45* 76*  CREATININE 1.72*   < > 1.65* 1.61* 1.57* 2.20* 2.34*  CALCIUM 7.4*  --  7.7*  --  7.7* 7.9* 7.7*  MG  --   --  2.9*  --   --  2.5* 2.7*   < > = values in this interval not displayed.   GFR: Estimated Creatinine Clearance: 53.1 mL/min (A) (by C-G formula based on SCr of 2.34 mg/dL (H)). Liver Function Tests: Recent Labs  Lab 05/07/20 0734 05/10/20 0533  AST 25  --   ALT 15  --   ALKPHOS 60  --   BILITOT 0.9  --   PROT 5.5*  --   ALBUMIN 2.0* 1.8*   No results for input(s): LIPASE, AMYLASE in the last 168 hours. No results for input(s): AMMONIA in the last 168 hours. Coagulation Profile: Recent Labs  Lab 05/07/20 1350 05/08/20 0629  INR 0.9 1.0   Cardiac Enzymes: No results for input(s): CKTOTAL, CKMB, CKMBINDEX, TROPONINI in the last 168 hours. BNP (last 3 results) No results for input(s): PROBNP in the last 8760 hours. HbA1C: No results for input(s): HGBA1C in the last 72 hours. CBG: No results for input(s): GLUCAP in the last 168 hours. Lipid Profile: Recent Labs    05/09/20 0516  CHOL 351*  HDL 46  LDLCALC UNABLE TO CALCULATE IF TRIGLYCERIDE OVER 400 mg/dL  TRIG 561*  CHOLHDL 7.6  LDLDIRECT 150.7*   Thyroid Function Tests: No results for input(s): TSH, T4TOTAL, FREET4, T3FREE, THYROIDAB in the last 72 hours. Anemia Panel: No results for input(s): VITAMINB12, FOLATE, FERRITIN, TIBC, IRON, RETICCTPCT in the last 72 hours. Sepsis Labs: No results for input(s):  PROCALCITON, LATICACIDVEN in the last 168 hours.  Recent Results (from the past 240 hour(s))  SARS Coronavirus 2 by RT PCR (hospital order, performed in [facility] hospital lab) Nasopharyngeal Nasopharyngeal Swab     Status: None   Collection Time: 05/07/20  8:12 AM   Specimen: Nasopharyngeal Swab  Result Value Ref Range Status   SARS Coronavirus 2 NEGATIVE NEGATIVE Final    Comment: (NOTE) SARS-CoV-2 target nucleic acids are NOT DETECTED.  The SARS-CoV-2 RNA is generally detectable in upper and lower respiratory specimens during the acute phase of infection. The lowest concentration of SARS-CoV-2 viral  copies this assay can detect is 250 copies / mL. A negative result does not preclude SARS-CoV-2 infection and should not be used as the sole basis for treatment or other patient management decisions.  A negative result may occur with improper specimen collection / handling, submission of specimen other than nasopharyngeal swab, presence of viral mutation(s) within the areas targeted by this assay, and inadequate number of viral copies (<250 copies / mL). A negative result must be combined with clinical observations, patient history, and epidemiological information.  Fact Sheet for Patients:   StrictlyIdeas.no  Fact Sheet for Healthcare Providers: BankingDealers.co.za  This test is not yet approved or  cleared by the [place] FDA and has been authorized for detection and/or diagnosis of SARS-CoV-2 by FDA under an Emergency Use Authorization (EUA).  This EUA will remain in effect (meaning this test can be used) for the duration of the COVID-19 declaration under Section 564(b)(1) of the Act, 21 U.S.C. section 360bbb-3(b)(1), unless the authorization is terminated or revoked sooner.  Performed at [facility], [address] [place], [place] [address]       Radiology Studies: US Venous Img Lower Bilateral  (DVT)  Result Date: 05/10/2020 CLINICAL DATA:  49 year old with anasarca. Pain and swelling in the lower extremities. EXAM: BILATERAL LOWER EXTREMITY VENOUS DOPPLER ULTRASOUND TECHNIQUE: Gray-scale sonography with graded compression, as well as color Doppler and duplex ultrasound were performed to evaluate the lower extremity deep venous systems from the level of the common femoral vein and including the common femoral, femoral, profunda femoral, popliteal and calf veins including the posterior tibial, peroneal and gastrocnemius veins when visible. The superficial great saphenous vein was also interrogated. Spectral Doppler was utilized to evaluate flow at rest and with distal augmentation maneuvers in the common femoral, femoral and popliteal veins. COMPARISON:  None. FINDINGS: RIGHT LOWER EXTREMITY Common Femoral Vein: No evidence of thrombus. Normal compressibility, respiratory phasicity and response to augmentation. Saphenofemoral Junction: No evidence of thrombus. Normal compressibility and flow on color Doppler imaging. Profunda Femoral Vein: No evidence of thrombus. Normal compressibility and flow on color Doppler imaging. Femoral Vein: No evidence of thrombus. Normal compressibility, respiratory phasicity and response to augmentation. Popliteal Vein: No evidence of thrombus. Normal compressibility, respiratory phasicity and response to augmentation. Calf Veins: No evidence of thrombus. Normal compressibility and flow on color Doppler imaging. Superficial Great Saphenous Vein: No evidence of thrombus. Normal compressibility. Venous Reflux:  None. Other Findings:  None. LEFT LOWER EXTREMITY Common Femoral Vein: No evidence of thrombus. Normal compressibility, respiratory phasicity and response to augmentation. Saphenofemoral Junction: No evidence of thrombus. Normal compressibility and flow on color Doppler imaging. Profunda Femoral Vein: No evidence of thrombus. Normal compressibility and flow on color  Doppler imaging. Femoral Vein: No evidence of thrombus. Normal compressibility, respiratory phasicity and response to augmentation. Popliteal Vein: No evidence of thrombus. Normal compressibility, respiratory phasicity and response to augmentation. Calf Veins: No evidence of thrombus. Normal compressibility and flow on color Doppler imaging. Superficial Great Saphenous Vein: Not well identified. Venous Reflux:  None. Other Findings:  None. IMPRESSION: No evidence of deep venous thrombosis in either lower extremity. Electronically Signed   By: Markus Daft M.D.   On: 05/10/2020 16:35   ECHOCARDIOGRAM COMPLETE  Result Date: 05/09/2020    ECHOCARDIOGRAM REPORT   Patient Name:   Russell Grant Date of Exam: 05/08/2020 Medical Rec #:  [id number]       Height:       70.0 in Accession #:  [id number]      Weight:       306.0 lb Date of Birth:  09-Nov-1970      BSA:          2.501 m Patient Age:    25 years        BP:           136/79 mmHg Patient Gender: M               HR:           86 bpm. Exam Location:  [facility] Procedure: 2D Echo, Cardiac Doppler, Color Doppler and Intracardiac            Opacification Agent Indications:     I50.9 Congestive heart failure  History:         Patient has no prior history of Echocardiogram examinations.                  Risk Factors:Obesity, Hypertension and Dyslipidemia. Chronic                  kidney disease.  Sonographer:     Wilford Sports Rodgers-Jones Referring Phys:  [id number] [place] Diagnosing Phys: Kate Sable MD IMPRESSIONS  1. Left ventricular ejection fraction, by estimation, is 65 to 70%. The left ventricle has normal function. The left ventricle has no regional wall motion abnormalities. There is mild left ventricular hypertrophy. Left ventricular diastolic parameters were normal.  2. Right ventricular systolic function is normal. The right ventricular size is normal.  3. The mitral valve is normal in structure. No evidence of mitral valve regurgitation. No evidence of  mitral stenosis.  4. The aortic valve is normal in structure. Aortic valve regurgitation is not visualized. No aortic stenosis is present.  5. The inferior vena cava is normal in size with greater than 50% respiratory variability, suggesting right atrial pressure of 3 mmHg. FINDINGS  Left Ventricle: Left ventricular ejection fraction, by estimation, is 65 to 70%. The left ventricle has normal function. The left ventricle has no regional wall motion abnormalities. Definity contrast agent was given IV to delineate the left ventricular  endocardial borders. The left ventricular internal cavity size was normal in size. There is mild left ventricular hypertrophy. Left ventricular diastolic parameters were normal. Right Ventricle: The right ventricular size is normal. No increase in right ventricular wall thickness. Right ventricular systolic function is normal. Left Atrium: Left atrial size was normal in size. Right Atrium: Right atrial size was normal in size. Pericardium: There is no evidence of pericardial effusion. Mitral Valve: The mitral valve is normal in structure. Normal mobility of the mitral valve leaflets. No evidence of mitral valve regurgitation. No evidence of mitral valve stenosis. Tricuspid Valve: The tricuspid valve is normal in structure. Tricuspid valve regurgitation is not demonstrated. No evidence of tricuspid stenosis. Aortic Valve: The aortic valve is normal in structure. Aortic valve regurgitation is not visualized. No aortic stenosis is present. Pulmonic Valve: The pulmonic valve was not well visualized. Pulmonic valve regurgitation is not visualized. No evidence of pulmonic stenosis. Aorta: The aortic root is normal in size and structure. Venous: The inferior vena cava is normal in size with greater than 50% respiratory variability, suggesting right atrial pressure of 3 mmHg. IAS/Shunts: No atrial level shunt detected by color flow Doppler.  LEFT VENTRICLE PLAX 2D LVIDd:         4.51 cm   Diastology LVIDs:         2.46 cm  LV  e' lateral:   12.10 cm/s LV PW:         1.19 cm  LV E/e' lateral: 6.4 LV IVS:        1.18 cm  LV e' medial:    8.59 cm/s LVOT diam:     1.90 cm  LV E/e' medial:  9.0 LV SV:         45 LV SV Index:   18 LVOT Area:     2.84 cm  RIGHT VENTRICLE             IVC RV Basal diam:  3.72 cm     IVC diam: 1.18 cm RV S prime:     18.60 cm/s TAPSE (M-mode): 2.2 cm LEFT ATRIUM             Index       RIGHT ATRIUM           Index LA diam:        4.10 cm 1.64 cm/m  RA Area:     11.10 cm LA Vol (A2C):   56.3 ml 22.51 ml/m RA Volume:   25.90 ml  10.36 ml/m LA Vol (A4C):   41.6 ml 16.63 ml/m LA Biplane Vol: 51.8 ml 20.71 ml/m  AORTIC VALVE LVOT Vmax:   99.90 cm/s LVOT Vmean:  84.900 cm/s LVOT VTI:    0.160 m  AORTA Ao Root diam: 3.30 cm MITRAL VALVE MV Area (PHT): 3.85 cm    SHUNTS MV Decel Time: 197 msec    Systemic VTI:  0.16 m MV E velocity: 77.10 cm/s  Systemic Diam: 1.90 cm MV A velocity: 83.90 cm/s MV E/A ratio:  0.92 Kate Sable MD Electronically signed by Kate Sable MD Signature Date/Time: 05/09/2020/11:27:52 AM    Final      Scheduled Meds: . aspirin  81 mg Oral Daily  . benazepril  20 mg Oral Daily  . enoxaparin (LOVENOX) injection  40 mg Subcutaneous Q12H  . furosemide  20 mg Intravenous BID  . [START ON 05/11/2020] predniSONE  80 mg Oral Q breakfast  . simvastatin  40 mg Oral q1800  . sodium chloride flush  3 mL Intravenous Q12H   Continuous Infusions: . sodium chloride    . albumin human 25 g (05/10/20 1306)     LOS: 3 days     Enzo Bi, MD [facility] If 7PM-7AM, please contact night-coverage 05/10/2020, 4:38 PM

## 2020-05-10 NOTE — Progress Notes (Signed)
Mobility Specialist - Progress Note   05/10/20 1101  Mobility  Activity Off unit (Cancellation)  Mobility performed by Mobility specialist     Per discussion w/ nurse, pt currently off unit. Will re-attempt when pt is available at later date/time.     Raynetta Osterloh Mobility Specialist  05/10/20, 11:03 AM

## 2020-05-11 LAB — MAGNESIUM: Magnesium: 2.5 mg/dL — ABNORMAL HIGH (ref 1.7–2.4)

## 2020-05-11 LAB — BASIC METABOLIC PANEL
Anion gap: 7 (ref 5–15)
BUN: 87 mg/dL — ABNORMAL HIGH (ref 6–20)
CO2: 25 mmol/L (ref 22–32)
Calcium: 7.6 mg/dL — ABNORMAL LOW (ref 8.9–10.3)
Chloride: 105 mmol/L (ref 98–111)
Creatinine, Ser: 2.19 mg/dL — ABNORMAL HIGH (ref 0.61–1.24)
GFR calc Af Amer: 39 mL/min — ABNORMAL LOW (ref >60)
GFR calc non Af Amer: 34 mL/min — ABNORMAL LOW (ref >60)
Glucose, Bld: 126 mg/dL — ABNORMAL HIGH (ref 70–99)
Potassium: 4.5 mmol/L (ref 3.5–5.1)
Sodium: 137 mmol/L (ref 135–145)

## 2020-05-11 LAB — CBC
HCT: 37.2 % — ABNORMAL LOW (ref 39.0–52.0)
Hemoglobin: 12.6 g/dL — ABNORMAL LOW (ref 13.0–17.0)
MCH: 30.2 pg (ref 26.0–34.0)
MCHC: 33.9 g/dL (ref 30.0–36.0)
MCV: 89.2 fL (ref 80.0–100.0)
Platelets: 207 10*3/uL (ref 150–400)
RBC: 4.17 MIL/uL — ABNORMAL LOW (ref 4.22–5.81)
RDW: 12.7 % (ref 11.5–15.5)
WBC: 19 10*3/uL — ABNORMAL HIGH (ref 4.0–10.5)
nRBC: 0 % (ref 0.0–0.2)

## 2020-05-11 MED ORDER — TORSEMIDE 20 MG PO TABS: 20.0000 mg | ORAL_TABLET | Freq: Every day | ORAL | 2 refills | Status: DC

## 2020-05-11 MED ORDER — SIMVASTATIN 40 MG PO TABS: 40.0000 mg | ORAL_TABLET | Freq: Every day | ORAL | 2 refills | Status: DC

## 2020-05-11 MED ORDER — ASPIRIN 81 MG PO CHEW: 81.0000 mg | CHEWABLE_TABLET | Freq: Every day | ORAL | Status: DC

## 2020-05-11 MED ORDER — PREDNISONE 20 MG PO TABS: 80.0000 mg | ORAL_TABLET | Freq: Every day | ORAL | 0 refills | Status: DC

## 2020-05-11 MED ORDER — CALCIUM 600-200 MG-UNIT PO TABS: 1.0000 | ORAL_TABLET | Freq: Every day | ORAL | Status: AC

## 2020-05-11 NOTE — Progress Notes (Signed)
[facility]  ROUNDING NOTE   Subjective:   Russell Grant is an 49 y.o.malewith PMH significant for HTN and obesity came to ED with sudden onset edema of his lower extremities. He got started on diuretics and consulted Nephrology for further management of his proteinuria.  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  Able to eat without nausea or vomiting Continues to have large amount of lower extremity edema No shortness of breath  Objective:  Vital signs in last 24 hours:  Temp:  [97.5 F (36.4 C)-98.2 F (36.8 C)] 97.5 F (36.4 C) (09/04 0606) Pulse Rate:  [75-91] 75 (09/04 0606) Resp:  [16-18] 18 (09/03 1947) BP: (118-145)/(58-83) 118/58 (09/04 0606) SpO2:  [95 %-98 %] 98 % (09/04 0606) Weight:  [502 kg] 138 kg (09/04 0500)  Weight change: -0.9 kg Filed Weights   05/09/20 0457 05/10/20 0418 05/11/20 0500  Weight: 136.1 kg (!) 138.9 kg (!) 138 kg    Intake/Output: I/O last 3 completed shifts: In: 249.2 [I.V.:49.3; IV Piggyback:200] Out: 1350 [Urine:1350]   Intake/Output this shift:  No intake/output data recorded.  Physical Exam: General: Resting in bed, in no acute distress  Head: Normocephalic, facial edema+ , appears flushed   Eyes: Sclerae and conjunctivae clear  Neck: Supple,   Lungs:  Diminished at the bases  Heart: S1S2,no rub or gallop  Abdomen:  Soft, nontender, non distended  Extremities:  Rt Hand with 1+ edema, Trace edema on lower extremities   Neurologic: Alert, oriented x4  Skin: No acute lesions or rashes        Basic Metabolic Panel: Recent Labs  Lab 05/07/20 0734 05/07/20 0734 05/07/20 0910 05/08/20 0629 05/08/20 0629 05/09/20 0516 05/10/20 0533 05/11/20 0414  NA 139  --   --  139  --  138 136 137  K 4.1  --   --  4.8  --  4.2 4.5 4.5  CL 108  --   --  105  --  104 105 105  CO2 25  --   --  28  --  25 24 25    GLUCOSE 108*  --   --  117*  --  160* 142* 126*  BUN 30*  --   --  31*  --  45* 76* 87*  CREATININE 1.65*   < > 1.61* 1.57*  --  2.20* 2.34* 2.19*  CALCIUM 7.7*   < >  --  7.7*   < > 7.9* 7.7* 7.6*  MG 2.9*  --   --   --   --  2.5* 2.7* 2.5*   < > = values in this interval not displayed.    Liver Function Tests: Recent Labs  Lab 05/07/20 0734 05/10/20 0533  AST 25  --   ALT 15  --   ALKPHOS 60  --   BILITOT 0.9  --   PROT 5.5*  --   ALBUMIN 2.0* 1.8*   No results for input(s): LIPASE, AMYLASE in the last 168 hours. No results for input(s): AMMONIA in the last 168 hours.  CBC: Recent Labs  Lab 05/07/20 0910 05/08/20 0629 05/09/20 0516 05/10/20 0533 05/11/20 0414  WBC 7.9 7.6 16.8* 23.9* 19.0*  HGB 14.9 14.6 16.2 14.4 12.6*  HCT 44.4 44.0 45.2 40.9 37.2*  MCV 90.2 91.1 86.4 86.5 89.2  PLT 196 180 257 255 207    Cardiac Enzymes: No results  for input(s): CKTOTAL, CKMB, CKMBINDEX, TROPONINI in the last 168 hours.  BNP: Invalid input(s): POCBNP  CBG: No results for input(s): GLUCAP in the last 168 hours.  Microbiology: Results for orders placed or performed during the hospital encounter of 05/07/20  SARS Coronavirus 2 by RT PCR (hospital order, performed in [facility] hospital lab) Nasopharyngeal Nasopharyngeal Swab     Status: None   Collection Time: 05/07/20  8:12 AM   Specimen: Nasopharyngeal Swab  Result Value Ref Range Status   SARS Coronavirus 2 NEGATIVE NEGATIVE Final    Comment: (NOTE) SARS-CoV-2 target nucleic acids are NOT DETECTED.  The SARS-CoV-2 RNA is generally detectable in upper and lower respiratory specimens during the acute phase of infection. The lowest concentration of SARS-CoV-2 viral copies this assay can detect is 250 copies / mL. A negative result does not preclude SARS-CoV-2 infection and should not be used as the sole basis for treatment or other patient management decisions.  A negative result may occur with improper specimen  collection / handling, submission of specimen other than nasopharyngeal swab, presence of viral mutation(s) within the areas targeted by this assay, and inadequate number of viral copies (<250 copies / mL). A negative result must be combined with clinical observations, patient history, and epidemiological information.  Fact Sheet for Patients:   StrictlyIdeas.no  Fact Sheet for Healthcare Providers: BankingDealers.co.za  This test is not yet approved or  cleared by the [place] FDA and has been authorized for detection and/or diagnosis of SARS-CoV-2 by FDA under an Emergency Use Authorization (EUA).  This EUA will remain in effect (meaning this test can be used) for the duration of the COVID-19 declaration under Section 564(b)(1) of the Act, 21 U.S.C. section 360bbb-3(b)(1), unless the authorization is terminated or revoked sooner.  Performed at [facility], [address] [place], [place] [address]     Coagulation Studies: No results for input(s): LABPROT, INR in the last 72 hours.  Urinalysis: No results for input(s): COLORURINE, LABSPEC, PHURINE, GLUCOSEU, HGBUR, BILIRUBINUR, KETONESUR, PROTEINUR, UROBILINOGEN, NITRITE, LEUKOCYTESUR in the last 72 hours.  Invalid input(s): APPERANCEUR    Imaging: US Venous Img Lower Bilateral (DVT)  Result Date: 05/10/2020 CLINICAL DATA:  49 year old with anasarca. Pain and swelling in the lower extremities. EXAM: BILATERAL LOWER EXTREMITY VENOUS DOPPLER ULTRASOUND TECHNIQUE: Gray-scale sonography with graded compression, as well as color Doppler and duplex ultrasound were performed to evaluate the lower extremity deep venous systems from the level of the common femoral vein and including the common femoral, femoral, profunda femoral, popliteal and calf veins including the posterior tibial, peroneal and gastrocnemius veins when visible. The superficial great saphenous vein was also  interrogated. Spectral Doppler was utilized to evaluate flow at rest and with distal augmentation maneuvers in the common femoral, femoral and popliteal veins. COMPARISON:  None. FINDINGS: RIGHT LOWER EXTREMITY Common Femoral Vein: No evidence of thrombus. Normal compressibility, respiratory phasicity and response to augmentation. Saphenofemoral Junction: No evidence of thrombus. Normal compressibility and flow on color Doppler imaging. Profunda Femoral Vein: No evidence of thrombus. Normal compressibility and flow on color Doppler imaging. Femoral Vein: No evidence of thrombus. Normal compressibility, respiratory phasicity and response to augmentation. Popliteal Vein: No evidence of thrombus. Normal compressibility, respiratory phasicity and response to augmentation. Calf Veins: No evidence of thrombus. Normal compressibility and flow on color Doppler imaging. Superficial Great Saphenous Vein: No evidence of thrombus. Normal compressibility. Venous Reflux:  None. Other Findings:  None. LEFT LOWER EXTREMITY Common Femoral Vein: No evidence of  thrombus. Normal compressibility, respiratory phasicity and response to augmentation. Saphenofemoral Junction: No evidence of thrombus. Normal compressibility and flow on color Doppler imaging. Profunda Femoral Vein: No evidence of thrombus. Normal compressibility and flow on color Doppler imaging. Femoral Vein: No evidence of thrombus. Normal compressibility, respiratory phasicity and response to augmentation. Popliteal Vein: No evidence of thrombus. Normal compressibility, respiratory phasicity and response to augmentation. Calf Veins: No evidence of thrombus. Normal compressibility and flow on color Doppler imaging. Superficial Great Saphenous Vein: Not well identified. Venous Reflux:  None. Other Findings:  None. IMPRESSION: No evidence of deep venous thrombosis in either lower extremity. Electronically Signed   By: Markus Daft M.D.   On: 05/10/2020 16:35   US Venous Img  Upper Uni Right(DVT)  Result Date: 05/10/2020 CLINICAL DATA:  49 year old with pain and swelling right upper extremity. Anasarca. EXAM: RIGHT UPPER EXTREMITY VENOUS DOPPLER ULTRASOUND TECHNIQUE: Gray-scale sonography with graded compression, as well as color Doppler and duplex ultrasound were performed to evaluate the upper extremity deep venous system from the level of the subclavian vein and including the jugular, axillary, basilic, radial, ulnar and upper cephalic vein. Spectral Doppler was utilized to evaluate flow at rest and with distal augmentation maneuvers. COMPARISON:  None. FINDINGS: Contralateral Subclavian Vein: No evidence of thrombus. Normal color Doppler flow and phasicity. Internal Jugular Vein: No evidence of thrombus. Normal compressibility, color Doppler flow and phasicity. Subclavian Vein: No evidence of thrombus. Normal color Doppler flow and phasicity. Normal compressibility. Axillary Vein: No evidence of thrombus. Normal compressibility, color flow and augmentation. Cephalic Vein: Positive for short segment thrombus in the cephalic vein near the antecubital fossa and associated with the peripheral IV site. This thrombus appears to be nonocclusive on the color Doppler images. Basilic Vein: No evidence of thrombus. Normal compressibility, color Doppler flow and augmentation. Brachial Veins: No evidence of thrombus. Normal compressibility, color Doppler flow and augmentation. Radial Veins: No evidence of thrombus. Normal compressibility and color Doppler flow. Ulnar Veins: No evidence of thrombus. Normal compressibility and color Doppler flow. Other Findings:  Subcutaneous edema. IMPRESSION: 1. Negative for deep venous thrombosis in the right upper extremity. 2. Positive for superficial venous thrombus in a short segment of the right cephalic vein associated with the peripheral IV site. Electronically Signed   By: Markus Daft M.D.   On: 05/10/2020 16:45     Medications:   . sodium chloride  250 mL (05/11/20 0177)  . albumin human 25 g (05/11/20 9390)   . aspirin  81 mg Oral Daily  . benazepril  20 mg Oral Daily  . enoxaparin (LOVENOX) injection  40 mg Subcutaneous Q12H  . furosemide  20 mg Intravenous BID  . predniSONE  80 mg Oral Q breakfast  . simvastatin  40 mg Oral q1800  . sodium chloride flush  3 mL Intravenous Q12H   sodium chloride, acetaminophen **OR** acetaminophen, hydrALAZINE, ondansetron **OR** ondansetron (ZOFRAN) IV, polyethylene glycol, sodium chloride flush  Assessment/ Plan:  Russell Grant is a 49 y.o. white male with hypertension, hyperlipidemia, allergies who was admitted to [facility] on 05/07/2020 for AKI (acute kidney injury) ([place]) [N17.9]  #  Acute kidney injury Creatinine starting to trend down now.  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 Will need to be  discharged with PO steroids.  Patient has follow-up with nephrology on September 7 as outpatient Discussed with patient and his wife that he should do weightbearing exercises to prevent both bone loss and muscle atrophy. Avoid smoking and excess alcohol. Calcium and vitamin D supplmentation. Discussed various systemic side effects of prednisone including gastritis, oral thrush, muscle weakness, early cataracts, increased blood sugars, irritability, insomnia, weight gain amongst others.  # Hypertension with generalized edema BP readings at the target with current treatment,on benazepril. Add low-dose torsemide 20 mg daily  #. Hyperlipidemia Continue simvastatin     LOS: 4 Breawna [place] 9/4/20218:38 AM

## 2020-05-11 NOTE — Discharge Summary (Addendum)
Physician Discharge Summary   Russell Grant  male DOB: 11/11/1970  ERD:[id number]  PCP: Patient, No Pcp Per  Admit date: 05/07/2020 Discharge date: 05/11/2020  Admitted From: home Disposition:  home CODE STATUS: Full code  Discharge Instructions    Diet - low sodium heart healthy   Complete by: As directed    Discharge instructions   Complete by: As directed    The kidney doctor started you on many new medications.  Please take them as directed.   Dr. Enzo Bi - -   No wound care   Complete by: As directed        Hospital Course:  For full details, please see H&P, progress notes, consult notes and ancillary notes.  Briefly,  Russell Grant an 49 y.o.Caucasian malewith PMH significant for HTN and obesity who was in his usual state of health until last month when he noted sudden onset edema of his lower extremities. Was seen in the ED and started on oral Lasix with recommendations to follow-up with renal. Patient has not been able to see a nephrologist as an outpatient yet and he continued to gain weight. Patient stated he had swelling up to his buttocks and he was finding it increasingly difficult to walk around due to fatigue and leg aching.   Anasarca secondary to nephrotic syndrome Patient with significant proteinuria (>13g) and anasarca that developed over the past month. nephrology consulted.  Serologic work up: ANA negative, anti-GBM negative, hemoglobin A1c within normal limits, viral hepatitis screen negative, HIV negative, ANCA negative, SPEP/UPEP negative.  Pt received kidney biopsy 9/1 and started on high-dose solumedrol 125 mg daily.  Pt was also diuresed with IV lasix 40 mg BID, however, urine output was not great so IV lasix increased to 60 mg BID.  Cr trended up, so diuretic was decreased to IV lasix 20 mg BID with addition of IV albumin.  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  Pt felt well so was discharged on prednisone 80 mg daily  and torsemide 20 mg daily until outpatient nephrology followup.  Acute kidney disease Baseline creatinine of 0.99 from April 07, 2020.  Cr peaked at 2.34 and started to trend down prior to discharge.  Pt was continued on home benazepril, per nephrology.  HTN Pt received IV diuretics.  HCTZ held and then d/c'ed since pt was discharged on torsemide.  continued home benazepril.  HLD LDL direct 150.  Already on simvastatin 20 mg at home.  Simvastatin increased to 40 mg daily.  Pt was advised to followup with PCP to check LDL level in about 3 months.  Hypertriglyceridemia triglycerides 561, already on fish oil at home.   Discharge Diagnoses:  Principal Problem:   Nephrotic syndrome Active Problems:   Hypertriglyceridemia   Essential hypertension   Class 2 severe obesity due to excess calories with serious comorbidity and body mass index (BMI) of 38.0 to 38.9 in adult [facility])   History of gout   AKI (acute kidney injury) ([place])   Anasarca    Discharge Instructions:  Allergies as of 05/11/2020      Reactions   Cefdinir Rash      Medication List    STOP taking these medications   hydrochlorothiazide 25 MG tablet Commonly known as: HYDRODIURIL     TAKE these medications   aspirin 81 MG chewable tablet Chew 1 tablet (81 mg total) by mouth daily.   benazepril 40 MG tablet Commonly known as: LOTENSIN TAKE 1 TABLET  BY MOUTH EVERY DAY   Calcium 600-200 MG-UNIT tablet Take 1 tablet by mouth daily. Can take any over-the-counter supplement.   Claritin 10 MG tablet Generic drug: loratadine Take 10 mg by mouth daily.   Fish Oil Extra Strength 1200 MG Caps Take 1 capsule by mouth at bedtime.   MULTIVITAMIN ADULTS PO Take 1 tablet by mouth daily.   predniSONE 20 MG tablet Commonly known as: DELTASONE Take 4 tablets (80 mg total) by mouth daily with breakfast. Start taking on: May 12, 2020   simvastatin 40 MG tablet Commonly known as: ZOCOR Take 1 tablet (40 mg  total) by mouth daily. What changed:   medication strength  how much to take   torsemide 20 MG tablet Commonly known as: Demadex Take 1 tablet (20 mg total) by mouth daily. Fluid pill.        Follow-up Information    Kolluru, Sarath, MD. Schedule an appointment as soon as possible for a visit in 1 week(s).   Specialty: Nephrology Contact information: [address] [place] [address] [phone]               Allergies  Allergen Reactions   Cefdinir Rash     The results of significant diagnostics from this hospitalization (including imaging, microbiology, ancillary and laboratory) are listed below for reference.   Consultations:   Procedures/Studies: DG Chest 2 View  Result Date: 05/06/2020 CLINICAL DATA:  Peripheral edema EXAM: CHEST - 2 VIEW COMPARISON:  04/07/2020 FINDINGS: Lung volumes are small, but are symmetric and are clear. No pneumothorax or pleural effusion. Cardiac size is within normal limits. Pulmonary vascularity is normal. No acute bone abnormality. IMPRESSION: No active cardiopulmonary disease. Electronically Signed   By: Fidela Salisbury MD   On: 05/06/2020 20:43   US Renal  Result Date: 05/07/2020 CLINICAL DATA:  ARF.  Worsening edema. EXAM: RENAL / URINARY TRACT ULTRASOUND COMPLETE COMPARISON:  No prior FINDINGS: Right Kidney: Renal measurements: 12.1 x 6.6 x 6.7 cm = volume: 279.5 mL. Mild increased echogenicity cannot be excluded. 4.8 cm simple cyst. No hydronephrosis visualized. Left Kidney: Renal measurements: 10.7 x 6.9 x 6.2 cm = volume: 241.0 mL. Mild increase echogenicity cannot be excluded. No mass or hydronephrosis visualized. Bladder: Appears normal for degree of bladder distention. Other: None. IMPRESSION: 1. Mild bilateral renal increased echogenicity cannot be excluded. No acute renal abnormality. No hydronephrosis. No bladder distention. 2. 4.8 cm simple right renal cyst. Electronically Signed   By: Marcello Moores   Register   On: 05/07/2020 09:22   US Venous Img Lower Bilateral (DVT)  Result Date: 05/10/2020 CLINICAL DATA:  49 year old with anasarca. Pain and swelling in the lower extremities. EXAM: BILATERAL LOWER EXTREMITY VENOUS DOPPLER ULTRASOUND TECHNIQUE: Gray-scale sonography with graded compression, as well as color Doppler and duplex ultrasound were performed to evaluate the lower extremity deep venous systems from the level of the common femoral vein and including the common femoral, femoral, profunda femoral, popliteal and calf veins including the posterior tibial, peroneal and gastrocnemius veins when visible. The superficial great saphenous vein was also interrogated. Spectral Doppler was utilized to evaluate flow at rest and with distal augmentation maneuvers in the common femoral, femoral and popliteal veins. COMPARISON:  None. FINDINGS: RIGHT LOWER EXTREMITY Common Femoral Vein: No evidence of thrombus. Normal compressibility, respiratory phasicity and response to augmentation. Saphenofemoral Junction: No evidence of thrombus. Normal compressibility and flow on color Doppler imaging. Profunda Femoral Vein: No evidence of thrombus. Normal compressibility and flow on  color Doppler imaging. Femoral Vein: No evidence of thrombus. Normal compressibility, respiratory phasicity and response to augmentation. Popliteal Vein: No evidence of thrombus. Normal compressibility, respiratory phasicity and response to augmentation. Calf Veins: No evidence of thrombus. Normal compressibility and flow on color Doppler imaging. Superficial Great Saphenous Vein: No evidence of thrombus. Normal compressibility. Venous Reflux:  None. Other Findings:  None. LEFT LOWER EXTREMITY Common Femoral Vein: No evidence of thrombus. Normal compressibility, respiratory phasicity and response to augmentation. Saphenofemoral Junction: No evidence of thrombus. Normal compressibility and flow on color Doppler imaging. Profunda Femoral Vein: No  evidence of thrombus. Normal compressibility and flow on color Doppler imaging. Femoral Vein: No evidence of thrombus. Normal compressibility, respiratory phasicity and response to augmentation. Popliteal Vein: No evidence of thrombus. Normal compressibility, respiratory phasicity and response to augmentation. Calf Veins: No evidence of thrombus. Normal compressibility and flow on color Doppler imaging. Superficial Great Saphenous Vein: Not well identified. Venous Reflux:  None. Other Findings:  None. IMPRESSION: No evidence of deep venous thrombosis in either lower extremity. Electronically Signed   By: Markus Daft M.D.   On: 05/10/2020 16:35   US Venous Img Upper Uni Right(DVT)  Result Date: 05/10/2020 CLINICAL DATA:  49 year old with pain and swelling right upper extremity. Anasarca. EXAM: RIGHT UPPER EXTREMITY VENOUS DOPPLER ULTRASOUND TECHNIQUE: Gray-scale sonography with graded compression, as well as color Doppler and duplex ultrasound were performed to evaluate the upper extremity deep venous system from the level of the subclavian vein and including the jugular, axillary, basilic, radial, ulnar and upper cephalic vein. Spectral Doppler was utilized to evaluate flow at rest and with distal augmentation maneuvers. COMPARISON:  None. FINDINGS: Contralateral Subclavian Vein: No evidence of thrombus. Normal color Doppler flow and phasicity. Internal Jugular Vein: No evidence of thrombus. Normal compressibility, color Doppler flow and phasicity. Subclavian Vein: No evidence of thrombus. Normal color Doppler flow and phasicity. Normal compressibility. Axillary Vein: No evidence of thrombus. Normal compressibility, color flow and augmentation. Cephalic Vein: Positive for short segment thrombus in the cephalic vein near the antecubital fossa and associated with the peripheral IV site. This thrombus appears to be nonocclusive on the color Doppler images. Basilic Vein: No evidence of thrombus. Normal  compressibility, color Doppler flow and augmentation. Brachial Veins: No evidence of thrombus. Normal compressibility, color Doppler flow and augmentation. Radial Veins: No evidence of thrombus. Normal compressibility and color Doppler flow. Ulnar Veins: No evidence of thrombus. Normal compressibility and color Doppler flow. Other Findings:  Subcutaneous edema. IMPRESSION: 1. Negative for deep venous thrombosis in the right upper extremity. 2. Positive for superficial venous thrombus in a short segment of the right cephalic vein associated with the peripheral IV site. Electronically Signed   By: Markus Daft M.D.   On: 05/10/2020 16:45   CT BIOPSY  Result Date: 05/08/2020 INDICATION: 49 year old male with acute kidney injury concerning for possible glomerulonephritis. He presents for random renal biopsy. Due to poor visualization on ultrasound, we will proceed with CT-guided biopsy. EXAM: CT-guided random renal biopsy MEDICATIONS: None. ANESTHESIA/SEDATION: Moderate (conscious) sedation was employed during this procedure. A total of Versed 2 mg and Fentanyl 100 mcg was administered intravenously. Moderate Sedation Time: 26 minutes. The patient's level of consciousness and vital signs were monitored continuously by radiology nursing throughout the procedure under my direct supervision. FLUOROSCOPY TIME:  None COMPLICATIONS: None immediate. PROCEDURE: Informed written consent was obtained from the patient after a thorough discussion of the procedural risks, benefits and alternatives. All questions were addressed. A timeout was  performed prior to the initiation of the procedure. The patient was placed prone on the CT gantry. A suitable skin entry site was selected and marked. The skin was sterilely prepped and draped in the standard fashion using chlorhexidine skin prep. Local anesthesia was attained by infiltration with 1% lidocaine. A small dermatotomy was made. Under intermittent CT guidance, a 15 gauge introducer  needle was advanced and positioned at the margin of the lower pole cortex of the right kidney. Several a 16 gauge core biopsies were then coaxially obtained using the Bard automated biopsy device. Biopsy specimens were placed in saline and delivered to pathology for further analysis. As the introducer needle was removed, the biopsy tract was embolized with a Gel-Foam slurry. Post biopsy CT imaging demonstrates no evidence of immediate complication. IMPRESSION: Technically successful CT-guided random core biopsy of the lower pole cortex of the left kidney. Electronically Signed   By: Jacqulynn Cadet M.D.   On: 05/08/2020 12:22   ECHOCARDIOGRAM COMPLETE  Result Date: 05/09/2020    ECHOCARDIOGRAM REPORT   Patient Name:   Russell Grant Date of Exam: 05/08/2020 Medical Rec #:  [id number]       Height:       70.0 in Accession #:    [id number]      Weight:       306.0 lb Date of Birth:  [birth year]      BSA:          2.501 m Patient Age:    34 years        BP:           136/79 mmHg Patient Gender: M               HR:           86 bpm. Exam Location:  [facility] Procedure: 2D Echo, Cardiac Doppler, Color Doppler and Intracardiac            Opacification Agent Indications:     I50.9 Congestive heart failure  History:         Patient has no prior history of Echocardiogram examinations.                  Risk Factors:Obesity, Hypertension and Dyslipidemia. Chronic                  kidney disease.  Sonographer:     Wilford Sports Rodgers-Jones Referring Phys:  [id number] Velva Diagnosing Phys: Kate Sable MD IMPRESSIONS  1. Left ventricular ejection fraction, by estimation, is 65 to 70%. The left ventricle has normal function. The left ventricle has no regional wall motion abnormalities. There is mild left ventricular hypertrophy. Left ventricular diastolic parameters were normal.  2. Right ventricular systolic function is normal. The right ventricular size is normal.  3. The mitral valve is normal in structure. No evidence  of mitral valve regurgitation. No evidence of mitral stenosis.  4. The aortic valve is normal in structure. Aortic valve regurgitation is not visualized. No aortic stenosis is present.  5. The inferior vena cava is normal in size with greater than 50% respiratory variability, suggesting right atrial pressure of 3 mmHg. FINDINGS  Left Ventricle: Left ventricular ejection fraction, by estimation, is 65 to 70%. The left ventricle has normal function. The left ventricle has no regional wall motion abnormalities. Definity contrast agent was given IV to delineate the left ventricular  endocardial borders. The left ventricular internal cavity size was normal in size. There is mild left ventricular  hypertrophy. Left ventricular diastolic parameters were normal. Right Ventricle: The right ventricular size is normal. No increase in right ventricular wall thickness. Right ventricular systolic function is normal. Left Atrium: Left atrial size was normal in size. Right Atrium: Right atrial size was normal in size. Pericardium: There is no evidence of pericardial effusion. Mitral Valve: The mitral valve is normal in structure. Normal mobility of the mitral valve leaflets. No evidence of mitral valve regurgitation. No evidence of mitral valve stenosis. Tricuspid Valve: The tricuspid valve is normal in structure. Tricuspid valve regurgitation is not demonstrated. No evidence of tricuspid stenosis. Aortic Valve: The aortic valve is normal in structure. Aortic valve regurgitation is not visualized. No aortic stenosis is present. Pulmonic Valve: The pulmonic valve was not well visualized. Pulmonic valve regurgitation is not visualized. No evidence of pulmonic stenosis. Aorta: The aortic root is normal in size and structure. Venous: The inferior vena cava is normal in size with greater than 50% respiratory variability, suggesting right atrial pressure of 3 mmHg. IAS/Shunts: No atrial level shunt detected by color flow Doppler.  LEFT  VENTRICLE PLAX 2D LVIDd:         4.51 cm  Diastology LVIDs:         2.46 cm  LV e' lateral:   12.10 cm/s LV PW:         1.19 cm  LV E/e' lateral: 6.4 LV IVS:        1.18 cm  LV e' medial:    8.59 cm/s LVOT diam:     1.90 cm  LV E/e' medial:  9.0 LV SV:         45 LV SV Index:   18 LVOT Area:     2.84 cm  RIGHT VENTRICLE             IVC RV Basal diam:  3.72 cm     IVC diam: 1.18 cm RV S prime:     18.60 cm/s TAPSE (M-mode): 2.2 cm LEFT ATRIUM             Index       RIGHT ATRIUM           Index LA diam:        4.10 cm 1.64 cm/m  RA Area:     11.10 cm LA Vol (A2C):   56.3 ml 22.51 ml/m RA Volume:   25.90 ml  10.36 ml/m LA Vol (A4C):   41.6 ml 16.63 ml/m LA Biplane Vol: 51.8 ml 20.71 ml/m  AORTIC VALVE LVOT Vmax:   99.90 cm/s LVOT Vmean:  84.900 cm/s LVOT VTI:    0.160 m  AORTA Ao Root diam: 3.30 cm MITRAL VALVE MV Area (PHT): 3.85 cm    SHUNTS MV Decel Time: 197 msec    Systemic VTI:  0.16 m MV E velocity: 77.10 cm/s  Systemic Diam: 1.90 cm MV A velocity: 83.90 cm/s MV E/A ratio:  0.92 Kate Sable MD Electronically signed by Kate Sable MD Signature Date/Time: 05/09/2020/11:27:52 AM    Final       Labs: BNP (last 3 results) Recent Labs    04/07/20 0116 04/10/20 1850 05/07/20 0759  BNP 78.5 57.1 61.6   Basic Metabolic Panel: Recent Labs  Lab 05/07/20 0734 05/07/20 0734 05/07/20 0910 05/08/20 0629 05/09/20 0516 05/10/20 0533 05/11/20 0414  NA 139  --   --  139 138 136 137  K 4.1  --   --  4.8 4.2 4.5 4.5  CL 108  --   --  105 104 105 105  CO2 25  --   --  28 25 24 25   GLUCOSE 108*  --   --  117* 160* 142* 126*  BUN 30*  --   --  31* 45* 76* 87*  CREATININE 1.65*   < > 1.61* 1.57* 2.20* 2.34* 2.19*  CALCIUM 7.7*  --   --  7.7* 7.9* 7.7* 7.6*  MG 2.9*  --   --   --  2.5* 2.7* 2.5*   < > = values in this interval not displayed.   Liver Function Tests: Recent Labs  Lab 05/07/20 0734 05/10/20 0533  AST 25  --   ALT 15  --   ALKPHOS 60  --   BILITOT 0.9  --   PROT  5.5*  --   ALBUMIN 2.0* 1.8*   No results for input(s): LIPASE, AMYLASE in the last 168 hours. No results for input(s): AMMONIA in the last 168 hours. CBC: Recent Labs  Lab 05/07/20 0910 05/08/20 0629 05/09/20 0516 05/10/20 0533 05/11/20 0414  WBC 7.9 7.6 16.8* 23.9* 19.0*  HGB 14.9 14.6 16.2 14.4 12.6*  HCT 44.4 44.0 45.2 40.9 37.2*  MCV 90.2 91.1 86.4 86.5 89.2  PLT 196 180 257 255 207   Cardiac Enzymes: No results for input(s): CKTOTAL, CKMB, CKMBINDEX, TROPONINI in the last 168 hours. BNP: Invalid input(s): POCBNP CBG: No results for input(s): GLUCAP in the last 168 hours. D-Dimer No results for input(s): DDIMER in the last 72 hours. Hgb A1c No results for input(s): HGBA1C in the last 72 hours. Lipid Profile Recent Labs    05/09/20 0516  CHOL 351*  HDL 46  LDLCALC UNABLE TO CALCULATE IF TRIGLYCERIDE OVER 400 mg/dL  TRIG 561*  CHOLHDL 7.6  LDLDIRECT 150.7*   Thyroid function studies No results for input(s): TSH, T4TOTAL, T3FREE, THYROIDAB in the last 72 hours.  Invalid input(s): FREET3 Anemia work up No results for input(s): VITAMINB12, FOLATE, FERRITIN, TIBC, IRON, RETICCTPCT in the last 72 hours. Urinalysis    Component Value Date/Time   COLORURINE YELLOW (A) 05/06/2020 2017   APPEARANCEUR CLOUDY (A) 05/06/2020 2017   APPEARANCEUR Turbid (A) 05/02/2019 0811   LABSPEC 1.027 05/06/2020 2017   PHURINE 7.0 05/06/2020 2017   GLUCOSEU >=500 (A) 05/06/2020 2017   HGBUR NEGATIVE 05/06/2020 2017   BILIRUBINUR NEGATIVE 05/06/2020 2017   BILIRUBINUR Negative 03/06/2020 1347   BILIRUBINUR Negative 05/02/2019 0811   KETONESUR NEGATIVE 05/06/2020 2017   PROTEINUR >=300 (A) 05/06/2020 2017   UROBILINOGEN 0.2 03/06/2020 1347   NITRITE NEGATIVE 05/06/2020 2017   LEUKOCYTESUR NEGATIVE 05/06/2020 2017   Sepsis Labs Invalid input(s): PROCALCITONIN,  WBC,  LACTICIDVEN Microbiology Recent Results (from the past 240 hour(s))  SARS Coronavirus 2 by RT PCR (hospital  order, performed in [place] hospital lab) Nasopharyngeal Nasopharyngeal Swab     Status: None   Collection Time: 05/07/20  8:12 AM   Specimen: Nasopharyngeal Swab  Result Value Ref Range Status   SARS Coronavirus 2 NEGATIVE NEGATIVE Final    Comment: (NOTE) SARS-CoV-2 target nucleic acids are NOT DETECTED.  The SARS-CoV-2 RNA is generally detectable in upper and lower respiratory specimens during the acute phase of infection. The lowest concentration of SARS-CoV-2 viral copies this assay can detect is 250 copies / mL. A negative result does not preclude SARS-CoV-2 infection and should not be used as the sole basis for treatment or other patient management decisions.  A negative result may occur with improper specimen collection / handling,  submission of specimen other than nasopharyngeal swab, presence of viral mutation(s) within the areas targeted by this assay, and inadequate number of viral copies (<250 copies / mL). A negative result must be combined with clinical observations, patient history, and epidemiological information.  Fact Sheet for Patients:   StrictlyIdeas.no  Fact Sheet for Healthcare Providers: BankingDealers.co.za  This test is not yet approved or  cleared by the [place] FDA and has been authorized for detection and/or diagnosis of SARS-CoV-2 by FDA under an Emergency Use Authorization (EUA).  This EUA will remain in effect (meaning this test can be used) for the duration of the COVID-19 declaration under Section 564(b)(1) of the Act, 21 U.S.C. section 360bbb-3(b)(1), unless the authorization is terminated or revoked sooner.  Performed at [facility], [address] [place], [place] [address]      Total time spend on discharging this patient, including the last patient exam, discussing the hospital stay, instructions for ongoing care as it relates to all pertinent caregivers, as well as  preparing the medical discharge records, prescriptions, and/or referrals as applicable, is 40 minutes.    Enzo Bi, MD  [facility] 05/11/2020, 9:12 AM  If 7PM-7AM, please contact night-coverage

## 2020-05-22 ENCOUNTER — Encounter: Payer: Self-pay | Admitting: Nephrology

## 2020-05-22 LAB — SURGICAL PATHOLOGY

## 2020-05-29 ENCOUNTER — Other Ambulatory Visit: Payer: Self-pay

## 2020-05-29 DIAGNOSIS — B369 Superficial mycosis, unspecified: Secondary | ICD-10-CM | POA: Diagnosis present

## 2020-05-29 DIAGNOSIS — D72829 Elevated white blood cell count, unspecified: Secondary | ICD-10-CM | POA: Diagnosis present

## 2020-05-29 DIAGNOSIS — E875 Hyperkalemia: Secondary | ICD-10-CM | POA: Diagnosis present

## 2020-05-29 DIAGNOSIS — E8770 Fluid overload, unspecified: Principal | ICD-10-CM | POA: Diagnosis present

## 2020-05-29 DIAGNOSIS — Z8249 Family history of ischemic heart disease and other diseases of the circulatory system: Secondary | ICD-10-CM

## 2020-05-29 DIAGNOSIS — Z20822 Contact with and (suspected) exposure to covid-19: Secondary | ICD-10-CM | POA: Diagnosis present

## 2020-05-29 DIAGNOSIS — R609 Edema, unspecified: Secondary | ICD-10-CM | POA: Diagnosis not present

## 2020-05-29 DIAGNOSIS — E781 Pure hyperglyceridemia: Secondary | ICD-10-CM | POA: Diagnosis present

## 2020-05-29 DIAGNOSIS — I129 Hypertensive chronic kidney disease with stage 1 through stage 4 chronic kidney disease, or unspecified chronic kidney disease: Secondary | ICD-10-CM | POA: Diagnosis present

## 2020-05-29 DIAGNOSIS — Z85828 Personal history of other malignant neoplasm of skin: Secondary | ICD-10-CM

## 2020-05-29 DIAGNOSIS — N170 Acute kidney failure with tubular necrosis: Secondary | ICD-10-CM | POA: Diagnosis present

## 2020-05-29 DIAGNOSIS — T380X5A Adverse effect of glucocorticoids and synthetic analogues, initial encounter: Secondary | ICD-10-CM | POA: Diagnosis present

## 2020-05-29 DIAGNOSIS — E785 Hyperlipidemia, unspecified: Secondary | ICD-10-CM | POA: Diagnosis present

## 2020-05-29 DIAGNOSIS — Z6841 Body Mass Index (BMI) 40.0 and over, adult: Secondary | ICD-10-CM

## 2020-05-29 DIAGNOSIS — R188 Other ascites: Secondary | ICD-10-CM | POA: Diagnosis present

## 2020-05-29 DIAGNOSIS — Z7982 Long term (current) use of aspirin: Secondary | ICD-10-CM

## 2020-05-29 DIAGNOSIS — D696 Thrombocytopenia, unspecified: Secondary | ICD-10-CM | POA: Diagnosis present

## 2020-05-29 DIAGNOSIS — N1832 Chronic kidney disease, stage 3b: Secondary | ICD-10-CM | POA: Diagnosis present

## 2020-05-29 DIAGNOSIS — Z79899 Other long term (current) drug therapy: Secondary | ICD-10-CM

## 2020-05-29 LAB — URINALYSIS, COMPLETE (UACMP) WITH MICROSCOPIC
Bacteria, UA: NONE SEEN
Bilirubin Urine: NEGATIVE
Glucose, UA: NEGATIVE mg/dL
Ketones, ur: NEGATIVE mg/dL
Leukocytes,Ua: NEGATIVE
Nitrite: NEGATIVE
Protein, ur: >=300 mg/dL — AB
Specific Gravity, Urine: 1.008 (ref 1.005–1.030)
pH: 5 (ref 5.0–8.0)

## 2020-05-29 LAB — COMPREHENSIVE METABOLIC PANEL
ALT: 118 U/L — ABNORMAL HIGH (ref 0–44)
AST: 61 U/L — ABNORMAL HIGH (ref 15–41)
Albumin: 1.9 g/dL — ABNORMAL LOW (ref 3.5–5.0)
Alkaline Phosphatase: 88 U/L (ref 38–126)
Anion gap: 10 (ref 5–15)
BUN: 66 mg/dL — ABNORMAL HIGH (ref 6–20)
CO2: 25 mmol/L (ref 22–32)
Calcium: 8.1 mg/dL — ABNORMAL LOW (ref 8.9–10.3)
Chloride: 101 mmol/L (ref 98–111)
Creatinine, Ser: 1.98 mg/dL — ABNORMAL HIGH (ref 0.61–1.24)
GFR calc Af Amer: 44 mL/min — ABNORMAL LOW (ref >60)
GFR calc non Af Amer: 38 mL/min — ABNORMAL LOW (ref >60)
Glucose, Bld: 150 mg/dL — ABNORMAL HIGH (ref 70–99)
Potassium: 4.9 mmol/L (ref 3.5–5.1)
Sodium: 136 mmol/L (ref 135–145)
Total Bilirubin: 0.8 mg/dL (ref 0.3–1.2)
Total Protein: 4.8 g/dL — ABNORMAL LOW (ref 6.5–8.1)

## 2020-05-29 LAB — CBC
HCT: 48.2 % (ref 39.0–52.0)
Hemoglobin: 15.6 g/dL (ref 13.0–17.0)
MCH: 29.9 pg (ref 26.0–34.0)
MCHC: 32.4 g/dL (ref 30.0–36.0)
MCV: 92.3 fL (ref 80.0–100.0)
Platelets: 185 10*3/uL (ref 150–400)
RBC: 5.22 MIL/uL (ref 4.22–5.81)
RDW: 13.7 % (ref 11.5–15.5)
WBC: 21.8 10*3/uL — ABNORMAL HIGH (ref 4.0–10.5)
nRBC: 0 % (ref 0.0–0.2)

## 2020-05-29 NOTE — ED Triage Notes (Signed)
Pt sent here from [facility] for abnormal urine results. States was told he is spilling protein into urine. Pt has hx of the same was admitted 3 weeks ago for AKI. Pt went to pmd due to fluid retention, states "abdomen feels tight.

## 2020-05-30 ENCOUNTER — Encounter: Payer: Self-pay | Admitting: Family Medicine

## 2020-05-30 ENCOUNTER — Observation Stay: Payer: BC Managed Care – PPO

## 2020-05-30 ENCOUNTER — Inpatient Hospital Stay
Admission: EM | Admit: 2020-05-30 | Discharge: 2020-06-04 | DRG: 640 | Disposition: A | Discharge status: Home / Self Care | Payer: BC Managed Care – PPO | Source: Ambulatory Visit | Attending: Internal Medicine | Admitting: Internal Medicine

## 2020-05-30 DIAGNOSIS — R109 Unspecified abdominal pain: Secondary | ICD-10-CM

## 2020-05-30 DIAGNOSIS — T380X5A Adverse effect of glucocorticoids and synthetic analogues, initial encounter: Secondary | ICD-10-CM | POA: Diagnosis present

## 2020-05-30 DIAGNOSIS — I129 Hypertensive chronic kidney disease with stage 1 through stage 4 chronic kidney disease, or unspecified chronic kidney disease: Secondary | ICD-10-CM | POA: Diagnosis present

## 2020-05-30 DIAGNOSIS — Z8249 Family history of ischemic heart disease and other diseases of the circulatory system: Secondary | ICD-10-CM | POA: Diagnosis not present

## 2020-05-30 DIAGNOSIS — R188 Other ascites: Secondary | ICD-10-CM | POA: Diagnosis present

## 2020-05-30 DIAGNOSIS — N1832 Chronic kidney disease, stage 3b: Secondary | ICD-10-CM

## 2020-05-30 DIAGNOSIS — I10 Essential (primary) hypertension: Secondary | ICD-10-CM

## 2020-05-30 DIAGNOSIS — Z6841 Body Mass Index (BMI) 40.0 and over, adult: Secondary | ICD-10-CM | POA: Diagnosis not present

## 2020-05-30 DIAGNOSIS — E875 Hyperkalemia: Secondary | ICD-10-CM | POA: Diagnosis present

## 2020-05-30 DIAGNOSIS — B369 Superficial mycosis, unspecified: Secondary | ICD-10-CM | POA: Diagnosis present

## 2020-05-30 DIAGNOSIS — E785 Hyperlipidemia, unspecified: Secondary | ICD-10-CM | POA: Diagnosis present

## 2020-05-30 DIAGNOSIS — Z7982 Long term (current) use of aspirin: Secondary | ICD-10-CM | POA: Diagnosis not present

## 2020-05-30 DIAGNOSIS — R601 Generalized edema: Secondary | ICD-10-CM | POA: Diagnosis not present

## 2020-05-30 DIAGNOSIS — N049 Nephrotic syndrome with unspecified morphologic changes: Secondary | ICD-10-CM | POA: Diagnosis not present

## 2020-05-30 DIAGNOSIS — D696 Thrombocytopenia, unspecified: Secondary | ICD-10-CM | POA: Diagnosis present

## 2020-05-30 DIAGNOSIS — N032 Chronic nephritic syndrome with diffuse membranous glomerulonephritis: Secondary | ICD-10-CM | POA: Insufficient documentation

## 2020-05-30 DIAGNOSIS — D72829 Elevated white blood cell count, unspecified: Secondary | ICD-10-CM | POA: Diagnosis not present

## 2020-05-30 DIAGNOSIS — R635 Abnormal weight gain: Secondary | ICD-10-CM | POA: Diagnosis not present

## 2020-05-30 DIAGNOSIS — N170 Acute kidney failure with tubular necrosis: Secondary | ICD-10-CM | POA: Diagnosis present

## 2020-05-30 DIAGNOSIS — R609 Edema, unspecified: Secondary | ICD-10-CM | POA: Diagnosis present

## 2020-05-30 DIAGNOSIS — E781 Pure hyperglyceridemia: Secondary | ICD-10-CM | POA: Diagnosis present

## 2020-05-30 DIAGNOSIS — E8770 Fluid overload, unspecified: Secondary | ICD-10-CM | POA: Diagnosis present

## 2020-05-30 DIAGNOSIS — Z85828 Personal history of other malignant neoplasm of skin: Secondary | ICD-10-CM | POA: Diagnosis not present

## 2020-05-30 DIAGNOSIS — Z79899 Other long term (current) drug therapy: Secondary | ICD-10-CM | POA: Diagnosis not present

## 2020-05-30 DIAGNOSIS — Z20822 Contact with and (suspected) exposure to covid-19: Secondary | ICD-10-CM | POA: Diagnosis present

## 2020-05-30 HISTORY — DX: Chronic nephritic syndrome with diffuse membranous glomerulonephritis: N03.2

## 2020-05-30 LAB — BASIC METABOLIC PANEL
Anion gap: 9 (ref 5–15)
BUN: 65 mg/dL — ABNORMAL HIGH (ref 6–20)
CO2: 26 mmol/L (ref 22–32)
Calcium: 7.5 mg/dL — ABNORMAL LOW (ref 8.9–10.3)
Chloride: 103 mmol/L (ref 98–111)
Creatinine, Ser: 2.01 mg/dL — ABNORMAL HIGH (ref 0.61–1.24)
GFR calc Af Amer: 44 mL/min — ABNORMAL LOW (ref >60)
GFR calc non Af Amer: 38 mL/min — ABNORMAL LOW (ref >60)
Glucose, Bld: 218 mg/dL — ABNORMAL HIGH (ref 70–99)
Potassium: 4.9 mmol/L (ref 3.5–5.1)
Sodium: 138 mmol/L (ref 135–145)

## 2020-05-30 LAB — CBC
HCT: 40.6 % (ref 39.0–52.0)
Hemoglobin: 13.9 g/dL (ref 13.0–17.0)
MCH: 30.8 pg (ref 26.0–34.0)
MCHC: 34.2 g/dL (ref 30.0–36.0)
MCV: 90 fL (ref 80.0–100.0)
Platelets: 146 K/uL — ABNORMAL LOW (ref 150–400)
RBC: 4.51 MIL/uL (ref 4.22–5.81)
RDW: 13.8 % (ref 11.5–15.5)
WBC: 16.7 K/uL — ABNORMAL HIGH (ref 4.0–10.5)
nRBC: 0 % (ref 0.0–0.2)

## 2020-05-30 LAB — SARS CORONAVIRUS 2 BY RT PCR (HOSPITAL ORDER, PERFORMED IN ~~LOC~~ HOSPITAL LAB): SARS Coronavirus 2: NEGATIVE

## 2020-05-30 MED ORDER — ADULT MULTIVITAMIN W/MINERALS CH
ORAL_TABLET | Freq: Every day | ORAL | Status: DC
  Administered 2020-05-31 – 2020-06-04 (×5): 1 via ORAL
  Filled 2020-05-30 (×6): qty 1

## 2020-05-30 MED ORDER — MAGNESIUM HYDROXIDE 400 MG/5ML PO SUSP: 30.0000 mL | Freq: Every day | ORAL | Status: DC | PRN

## 2020-05-30 MED ORDER — LORATADINE 10 MG PO TABS
10.0000 mg | ORAL_TABLET | Freq: Every day | ORAL | Status: DC
  Administered 2020-05-31 – 2020-06-04 (×5): 10 mg via ORAL
  Filled 2020-05-30 (×5): qty 1

## 2020-05-30 MED ORDER — NYSTATIN 100000 UNIT/GM EX POWD
Freq: Three times a day (TID) | CUTANEOUS | Status: DC
  Filled 2020-05-30: qty 15

## 2020-05-30 MED ORDER — PREDNISONE 20 MG PO TABS
80.0000 mg | ORAL_TABLET | Freq: Every day | ORAL | Status: DC
  Administered 2020-05-30 – 2020-06-04 (×6): 80 mg via ORAL
  Filled 2020-05-30 (×7): qty 4

## 2020-05-30 MED ORDER — ONDANSETRON HCL 4 MG PO TABS: 4.0000 mg | ORAL_TABLET | Freq: Four times a day (QID) | ORAL | Status: DC | PRN

## 2020-05-30 MED ORDER — TRAZODONE HCL 50 MG PO TABS
25.0000 mg | ORAL_TABLET | Freq: Every evening | ORAL | Status: DC | PRN
  Administered 2020-05-30: 25 mg via ORAL
  Filled 2020-05-30: qty 1

## 2020-05-30 MED ORDER — ENOXAPARIN SODIUM 40 MG/0.4ML ~~LOC~~ SOLN
SUBCUTANEOUS | Status: AC
  Filled 2020-05-30: qty 0.4

## 2020-05-30 MED ORDER — SIMVASTATIN 40 MG PO TABS
40.0000 mg | ORAL_TABLET | Freq: Every day | ORAL | Status: DC
  Administered 2020-05-30 – 2020-06-04 (×6): 40 mg via ORAL
  Filled 2020-05-30 (×5): qty 1

## 2020-05-30 MED ORDER — ASPIRIN 81 MG PO CHEW
81.0000 mg | CHEWABLE_TABLET | Freq: Every day | ORAL | Status: DC
  Administered 2020-05-31 – 2020-06-04 (×5): 81 mg via ORAL
  Filled 2020-05-30 (×6): qty 1

## 2020-05-30 MED ORDER — CALCIUM 600-200 MG-UNIT PO TABS: 1.0000 | ORAL_TABLET | Freq: Every day | ORAL | Status: DC

## 2020-05-30 MED ORDER — FISH OIL EXTRA STRENGTH 1200 MG PO CAPS: 1.0000 | ORAL_CAPSULE | Freq: Every day | ORAL | Status: DC

## 2020-05-30 MED ORDER — ENOXAPARIN SODIUM 40 MG/0.4ML ~~LOC~~ SOLN: 40.0000 mg | SUBCUTANEOUS | Status: DC

## 2020-05-30 MED ORDER — FUROSEMIDE 10 MG/ML IJ SOLN
60.0000 mg | Freq: Once | INTRAMUSCULAR | Status: AC
  Administered 2020-05-30: 60 mg via INTRAVENOUS
  Filled 2020-05-30: qty 8

## 2020-05-30 MED ORDER — FUROSEMIDE 10 MG/ML IJ SOLN
60.0000 mg | Freq: Three times a day (TID) | INTRAMUSCULAR | Status: DC
  Administered 2020-05-30 – 2020-06-04 (×15): 60 mg via INTRAVENOUS
  Filled 2020-05-30 (×15): qty 8

## 2020-05-30 MED ORDER — FUROSEMIDE 10 MG/ML IJ SOLN: 60.0000 mg | Freq: Two times a day (BID) | INTRAMUSCULAR | Status: DC

## 2020-05-30 MED ORDER — SODIUM CHLORIDE 0.9 % IV SOLN: INTRAVENOUS | Status: DC

## 2020-05-30 MED ORDER — ACETAMINOPHEN 650 MG RE SUPP: 650.0000 mg | Freq: Four times a day (QID) | RECTAL | Status: DC | PRN

## 2020-05-30 MED ORDER — ENOXAPARIN SODIUM 40 MG/0.4ML ~~LOC~~ SOLN
40.0000 mg | Freq: Two times a day (BID) | SUBCUTANEOUS | Status: DC
  Administered 2020-05-30: 40 mg via SUBCUTANEOUS
  Filled 2020-05-30 (×2): qty 0.4

## 2020-05-30 MED ORDER — OMEGA-3-ACID ETHYL ESTERS 1 G PO CAPS: 1.0000 g | ORAL_CAPSULE | Freq: Every day | ORAL | Status: DC

## 2020-05-30 MED ORDER — ALBUMIN HUMAN 25 % IV SOLN
25.0000 g | Freq: Three times a day (TID) | INTRAVENOUS | Status: AC
  Administered 2020-05-30 – 2020-06-02 (×9): 25 g via INTRAVENOUS
  Filled 2020-05-30 (×10): qty 100

## 2020-05-30 MED ORDER — ACETAMINOPHEN 325 MG PO TABS: 650.0000 mg | ORAL_TABLET | Freq: Four times a day (QID) | ORAL | Status: DC | PRN

## 2020-05-30 MED ORDER — ONDANSETRON HCL 4 MG/2ML IJ SOLN: 4.0000 mg | Freq: Four times a day (QID) | INTRAMUSCULAR | Status: DC | PRN

## 2020-05-30 NOTE — ED Notes (Signed)
MD at bedside. 

## 2020-05-30 NOTE — Progress Notes (Signed)
Brief hospitalist update note.  This is a nonbillable note.  Please see same-day H&P for full billable details.  Briefly, this is a 49 year old male with recent diagnosis of FSGS with tip lesions.  He was seen in the nephrology office by Dr. Juleen China.  Started on high-dose steroids and torsemide at that time.  Has not responded to outpatient torsemide and presented to [facility] on 05/30/2020.  Seen by nephrology consultant.  Currently recommending high-dose Lasix 60 mg IV every 8 hours in addition to albumin boluses 25 g 3 times daily.  Monitor urine output carefully.  Daily weights.  Fluid restrict, salt restrict.  Check abdominal ultrasound given concern for ascites.  Will consider image guided paracentesis pending ultrasound results.  Case discussed with Dr. Juleen China.  Ralene Muskrat MD

## 2020-05-30 NOTE — ED Notes (Signed)
Received report from April RN  Pt is resting at present

## 2020-05-30 NOTE — ED Notes (Signed)
Pt with edema noted legs are 3+ , arms 1+, abd 1+. Pt states he doubled his lasix yesterday. Pt denies orthopnea. Pt with kidney disease that affects the protein levels in his blood.

## 2020-05-30 NOTE — Progress Notes (Signed)
PHARMACIST - PHYSICIAN COMMUNICATION  CONCERNING:  Enoxaparin (Lovenox) for DVT Prophylaxis    RECOMMENDATION: Patient was prescribed enoxaprin 40mg  q24 hours for VTE prophylaxis.   Filed Weights   05/29/20 1930  Weight: (!) 146.5 kg (323 lb)    Body mass index is 46.35 kg/m.  Estimated Creatinine Clearance: 63.7 mL/min (A) (by C-G formula based on SCr of 2.01 mg/dL (H)).   Based on [place] patient is candidate for enoxaparin 40mg  every 12 hours due to BMI being >40.  DESCRIPTION: Pharmacy has adjusted enoxaparin dose per [facility] policy.  Patient is now receiving enoxaparin 40 mg every 12 hours    Rowland Lathe, PharmD Clinical Pharmacist  05/30/2020 7:18 AM

## 2020-05-30 NOTE — ED Notes (Signed)
Report to [place], rn.
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
## 2020-05-30 NOTE — Progress Notes (Signed)
[facility]  ROUNDING NOTE   Subjective:   [name] was seen by me in my office on 9/7 for new diagnosis of FSGS with tip lesions. He was started on high does steroids and torsemide. However he is not responding to the oral torsemide.   Admitted to [facility] on 05/30/2020 for Anasarca [R60.1]   Objective:  Vital signs in last 24 hours:  Temp:  [97.7 F (36.5 C)-98 F (36.7 C)] 98 F (36.7 C) (09/23 0114) Pulse Rate:  [73-95] 94 (09/23 0902) Resp:  [13-18] 15 (09/23 0902) BP: (141-205)/(70-89) 156/83 (09/23 0902) SpO2:  [94 %-99 %] 98 % (09/23 0902) Weight:  [146.5 kg] 146.5 kg (09/22 1930)  Weight change:  Filed Weights   05/29/20 1930  Weight: (!) 146.5 kg    Intake/Output: I/O last 3 completed shifts: In: -  Out: 400 [Urine:400]   Intake/Output this shift:  No intake/output data recorded.  Physical Exam: General: NAD, laying in bed  Head: Normocephalic, atraumatic. Moist oral mucosal membranes  Eyes: Anicteric, PERRL  Neck: Supple, trachea midline  Lungs:  Clear to auscultation  Heart: Regular rate and rhythm  Abdomen:  Soft, nontender, obese  Extremities:  ++ peripheral edema.  Neurologic: Nonfocal, moving all four extremities  Skin: +erythema at pannus folds and groin  Access: none    Basic Metabolic Panel: Recent Labs  Lab 05/29/20 1933 05/30/20 0300  NA 136 138  K 4.9 4.9  CL 101 103  CO2 25 26  GLUCOSE 150* 218*  BUN 66* 65*  CREATININE 1.98* 2.01*  CALCIUM 8.1* 7.5*    Liver Function Tests: Recent Labs  Lab 05/29/20 1933  AST 61*  ALT 118*  ALKPHOS 88  BILITOT 0.8  PROT 4.8*  ALBUMIN 1.9*   No results for input(s): LIPASE, AMYLASE in the last 168 hours. No results for input(s): AMMONIA in the last 168 hours.  CBC: Recent Labs  Lab 05/29/20 1933 05/30/20 0300  WBC 21.8* 16.7*  HGB 15.6 13.9  HCT 48.2 40.6  MCV 92.3 90.0  PLT 185 146*    Cardiac Enzymes: No results for input(s): CKTOTAL, CKMB,  CKMBINDEX, TROPONINI in the last 168 hours.  BNP: Invalid input(s): POCBNP  CBG: No results for input(s): GLUCAP in the last 168 hours.  Microbiology: Results for orders placed or performed during the hospital encounter of 05/30/20  SARS Coronavirus 2 by RT PCR (hospital order, performed in [facility] hospital lab) Nasopharyngeal Nasopharyngeal Swab     Status: None   Collection Time: 05/30/20  1:10 AM   Specimen: Nasopharyngeal Swab  Result Value Ref Range Status   SARS Coronavirus 2 NEGATIVE NEGATIVE Final    Comment: (NOTE) SARS-CoV-2 target nucleic acids are NOT DETECTED.  The SARS-CoV-2 RNA is generally detectable in upper and lower respiratory specimens during the acute phase of infection. The lowest concentration of SARS-CoV-2 viral copies this assay can detect is 250 copies / mL. A negative result does not preclude SARS-CoV-2 infection and should not be used as the sole basis for treatment or other patient management decisions.  A negative result may occur with improper specimen collection / handling, submission of specimen other than nasopharyngeal swab, presence of viral mutation(s) within the areas targeted by this assay, and inadequate number of viral copies (<250 copies / mL). A negative result must be combined with clinical observations, patient history, and epidemiological information.  Fact Sheet for Patients:   StrictlyIdeas.no  Fact Sheet for Healthcare Providers: BankingDealers.co.za  This  test is not yet approved or  cleared by the [place] and has been authorized for detection and/or diagnosis of SARS-CoV-2 by FDA under an Emergency Use Authorization (EUA).  This EUA will remain in effect (meaning this test can be used) for the duration of the COVID-19 declaration under Section 564(b)(1) of the Act, 21 U.S.C. section 360bbb-3(b)(1), unless the authorization is terminated or revoked  sooner.  Performed at [facility], [address] [place], [place] [address]     Coagulation Studies: No results for input(s): LABPROT, INR in the last 72 hours.  Urinalysis: Recent Labs    05/29/20 1933  COLORURINE STRAW*  LABSPEC 1.008  PHURINE 5.0  GLUCOSEU NEGATIVE  HGBUR SMALL*  BILIRUBINUR NEGATIVE  KETONESUR NEGATIVE  PROTEINUR >=300*  NITRITE NEGATIVE  LEUKOCYTESUR NEGATIVE      Imaging: No results found.   Medications:   . albumin human     . aspirin  81 mg Oral Daily  . enoxaparin (LOVENOX) injection  40 mg Subcutaneous Q12H  . furosemide  60 mg Intravenous Q8H  . loratadine  10 mg Oral Daily  . multivitamin with minerals   Oral Daily  . nystatin   Topical TID  . predniSONE  80 mg Oral Q breakfast  . simvastatin  40 mg Oral Daily   acetaminophen **OR** acetaminophen, magnesium hydroxide, ondansetron **OR** ondansetron (ZOFRAN) IV, traZODone  Assessment/ Plan:  [name] is a 49 y.o. white 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 with tip lesions, hypertension, hyperlipidemia, and allergies who is admitted to [facility] on 05/30/2020 for Anasarca [R60.1]  1. Acute renal failure 2. Hyperkalemia 3. Focal Segmental Glomerulonephritis with tip lesions: on prednisone 80mg  daily.  4. Nephrotic syndrome with proteinuria 5. Anasarca  Impression: seems patient has failed outpatient therapy of torsemide 20mg  daily.  - IV furosemide 60mg  q8 - IV albumin 25g q8 - fluid restriction and salt restriction - Continue high dose steroids - nystatin for candidiasis - Check abdominal ultrasound    LOS: 0 [place] Korver 9/23/202112:06 PM

## 2020-05-31 LAB — CBC WITH DIFFERENTIAL/PLATELET
Abs Immature Granulocytes: 0.2 10*3/uL — ABNORMAL HIGH (ref 0.00–0.07)
Basophils Absolute: 0 10*3/uL (ref 0.0–0.1)
Basophils Relative: 0 %
Eosinophils Absolute: 0 10*3/uL (ref 0.0–0.5)
Eosinophils Relative: 0 %
HCT: 38.2 % — ABNORMAL LOW (ref 39.0–52.0)
Hemoglobin: 12.9 g/dL — ABNORMAL LOW (ref 13.0–17.0)
Immature Granulocytes: 1 %
Lymphocytes Relative: 6 %
Lymphs Abs: 0.9 10*3/uL (ref 0.7–4.0)
MCH: 30.6 pg (ref 26.0–34.0)
MCHC: 33.8 g/dL (ref 30.0–36.0)
MCV: 90.5 fL (ref 80.0–100.0)
Monocytes Absolute: 0.6 10*3/uL (ref 0.1–1.0)
Monocytes Relative: 4 %
Neutro Abs: 14.9 10*3/uL — ABNORMAL HIGH (ref 1.7–7.7)
Neutrophils Relative %: 89 %
Platelets: 126 10*3/uL — ABNORMAL LOW (ref 150–400)
RBC: 4.22 MIL/uL (ref 4.22–5.81)
RDW: 13.7 % (ref 11.5–15.5)
WBC: 16.7 10*3/uL — ABNORMAL HIGH (ref 4.0–10.5)
nRBC: 0 % (ref 0.0–0.2)

## 2020-05-31 LAB — BASIC METABOLIC PANEL
Anion gap: 8 (ref 5–15)
BUN: 60 mg/dL — ABNORMAL HIGH (ref 6–20)
CO2: 32 mmol/L (ref 22–32)
Calcium: 8.3 mg/dL — ABNORMAL LOW (ref 8.9–10.3)
Chloride: 102 mmol/L (ref 98–111)
Creatinine, Ser: 1.64 mg/dL — ABNORMAL HIGH (ref 0.61–1.24)
GFR calc Af Amer: 56 mL/min — ABNORMAL LOW (ref >60)
GFR calc non Af Amer: 48 mL/min — ABNORMAL LOW (ref >60)
Glucose, Bld: 158 mg/dL — ABNORMAL HIGH (ref 70–99)
Potassium: 5.2 mmol/L — ABNORMAL HIGH (ref 3.5–5.1)
Sodium: 142 mmol/L (ref 135–145)

## 2020-05-31 MED ORDER — SODIUM CHLORIDE 0.9 % IV SOLN
INTRAVENOUS | Status: DC | PRN
  Administered 2020-05-31: 250 mL via INTRAVENOUS

## 2020-05-31 MED ORDER — BENAZEPRIL HCL 20 MG PO TABS
20.0000 mg | ORAL_TABLET | Freq: Every day | ORAL | Status: DC
  Administered 2020-05-31 – 2020-06-04 (×5): 20 mg via ORAL
  Filled 2020-05-31 (×5): qty 1

## 2020-05-31 NOTE — Progress Notes (Signed)
[facility]  ROUNDING NOTE   Subjective:   Refusing lovenox. Does not want a parecentesis.   UOP   Furosemide 60mg  IV q8 IV albumin 25g q8  K 5.2  Objective:  Vital signs in last 24 hours:  Temp:  [97.5 F (36.4 C)-98.6 F (37 C)] 97.5 F (36.4 C) (09/24 0449) Pulse Rate:  [77-95] 77 (09/24 0449) Resp:  [16-20] 20 (09/24 0449) BP: (148-158)/(77-90) 154/77 (09/24 0449) SpO2:  [97 %-99 %] 98 % (09/24 0449) Weight:  [142 kg] 142 kg (09/24 0600)  Weight change: -4.512 kg Filed Weights   05/29/20 1930 05/31/20 0600  Weight: (!) 146.5 kg (!) 142 kg    Intake/Output: I/O last 3 completed shifts: In: 69 [P.O.:480; IV Piggyback:100] Out: 2925 [Urine:2925]   Intake/Output this shift:  Total I/O In: -  Out: 200 [Urine:200]  Physical Exam: General: NAD, laying in bed  Head: Normocephalic, atraumatic. Moist oral mucosal membranes  Eyes: Anicteric, PERRL  Neck: Supple, trachea midline  Lungs:  Clear to auscultation  Heart: Regular rate and rhythm  Abdomen:  Soft, nontender, obese, + abdominal wall edema  Extremities:  ++ peripheral edema.  Neurologic: Nonfocal, moving all four extremities  Skin: +erythema at pannus folds and groin  Access: none    Basic Metabolic Panel: Recent Labs  Lab 05/29/20 1933 05/30/20 0300 05/31/20 0844  NA 136 138 142  K 4.9 4.9 5.2*  CL 101 103 102  CO2 25 26 32  GLUCOSE 150* 218* 158*  BUN 66* 65* 60*  CREATININE 1.98* 2.01* 1.64*  CALCIUM 8.1* 7.5* 8.3*    Liver Function Tests: Recent Labs  Lab 05/29/20 1933  AST 61*  ALT 118*  ALKPHOS 88  BILITOT 0.8  PROT 4.8*  ALBUMIN 1.9*   No results for input(s): LIPASE, AMYLASE in the last 168 hours. No results for input(s): AMMONIA in the last 168 hours.  CBC: Recent Labs  Lab 05/29/20 1933 05/30/20 0300 05/31/20 0844  WBC 21.8* 16.7* 16.7*  NEUTROABS  --   --  14.9*  HGB 15.6 13.9 12.9*  HCT 48.2 40.6 38.2*  MCV 92.3 90.0 90.5  PLT 185 146* 126*     Cardiac Enzymes: No results for input(s): CKTOTAL, CKMB, CKMBINDEX, TROPONINI in the last 168 hours.  BNP: Invalid input(s): POCBNP  CBG: No results for input(s): GLUCAP in the last 168 hours.  Microbiology: Results for orders placed or performed during the hospital encounter of 05/30/20  SARS Coronavirus 2 by RT PCR (hospital order, performed in [facility] hospital lab) Nasopharyngeal Nasopharyngeal Swab     Status: None   Collection Time: 05/30/20  1:10 AM   Specimen: Nasopharyngeal Swab  Result Value Ref Range Status   SARS Coronavirus 2 NEGATIVE NEGATIVE Final    Comment: (NOTE) SARS-CoV-2 target nucleic acids are NOT DETECTED.  The SARS-CoV-2 RNA is generally detectable in upper and lower respiratory specimens during the acute phase of infection. The lowest concentration of SARS-CoV-2 viral copies this assay can detect is 250 copies / mL. A negative result does not preclude SARS-CoV-2 infection and should not be used as the sole basis for treatment or other patient management decisions.  A negative result may occur with improper specimen collection / handling, submission of specimen other than nasopharyngeal swab, presence of viral mutation(s) within the areas targeted by this assay, and inadequate number of viral copies (<250 copies / mL). A negative result must be combined with clinical observations, patient history, and epidemiological information.  Fact  Sheet for Patients:   StrictlyIdeas.no  Fact Sheet for Healthcare Providers: BankingDealers.co.za  This test is not yet approved or  cleared by the [place] FDA and has been authorized for detection and/or diagnosis of SARS-CoV-2 by FDA under an Emergency Use Authorization (EUA).  This EUA will remain in effect (meaning this test can be used) for the duration of the COVID-19 declaration under Section 564(b)(1) of the Act, 21 U.S.C. section 360bbb-3(b)(1),  unless the authorization is terminated or revoked sooner.  Performed at [facility], [address] [place], [place] [address]     Coagulation Studies: No results for input(s): LABPROT, INR in the last 72 hours.  Urinalysis: Recent Labs    05/29/20 [place] 1.008  PHURINE 5.0  GLUCOSEU NEGATIVE  [place] NEGATIVE  PROTEINUR >=300*  NITRITE NEGATIVE  LEUKOCYTESUR NEGATIVE      Imaging: US Abdomen Complete  Result Date: 05/30/2020 CLINICAL DATA:  Diffuse abdominal pain EXAM: ABDOMEN ULTRASOUND COMPLETE COMPARISON:  Renal sonogram from May 07, 2020 FINDINGS: Gallbladder: No gallstones or wall thickening visualized. No sonographic Murphy sign noted by sonographer. Common bile duct: Diameter: 4 mm Liver: Echogenicity mildly increased in hepatic parenchyma. Assessment of hepatic parenchyma limited by increased echogenicity and patient body habitus. Portal vein is patent on color Doppler imaging with normal direction of blood flow towards the liver. IVC: No abnormality visualized. Pancreas: Not well visualized. No gross abnormality in the visualized portion. Spleen: Size and appearance within normal limits. Right Kidney: Length: 11.7 cm. No hydronephrosis with a 4.4 x 4.1 x 3.9 cm cyst in the upper pole. Left Kidney: Length: 11.2 cm. No hydronephrosis, visible lesion or nephrolithiasis on limited assessment. Abdominal aorta: Distal portion of the aorta obscured by overlying bowel gas. Maximum AP diameter 2.5 cm. Other findings: RIGHT pleural effusion and small to moderate ascites. IMPRESSION: 1. Mild increased hepatic echogenicity suggest background hepatic steatosis. 2. No hydronephrosis. 3. RIGHT pleural effusion and small to moderate volume of ascites in keeping with the patient's history of anasarca. 4. 2.5 cm mild dilation of the upper abdominal aorta. Distal aorta not well visualized. Electronically Signed   By:  Zetta Bills M.D.   On: 05/30/2020 14:16     Medications:   . albumin human 25 g (05/31/20 0601)   . aspirin  81 mg Oral Daily  . enoxaparin (LOVENOX) injection  40 mg Subcutaneous Q12H  . furosemide  60 mg Intravenous Q8H  . loratadine  10 mg Oral Daily  . multivitamin with minerals   Oral Daily  . nystatin   Topical TID  . predniSONE  80 mg Oral Q breakfast  . simvastatin  40 mg Oral Daily   acetaminophen **OR** acetaminophen, magnesium hydroxide, ondansetron **OR** ondansetron (ZOFRAN) IV, traZODone  Assessment/ Plan:  [name] is a 49 y.o. white 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 with tip lesions, hypertension, hyperlipidemia, and allergies who is admitted to [facility] on 05/30/2020 for Anasarca [R60.1] Abdominal pain [R10.9]  1. Acute renal failure 2. Hyperkalemia 3. Focal Segmental Glomerulonephritis with tip lesions: on prednisone 80mg  daily.  4. Nephrotic syndrome with proteinuria 5. Anasarca  Impression: seems patient has failed outpatient therapy of torsemide 20mg  daily.  - IV furosemide 60mg  q8 - IV albumin 25g q8 - fluid restriction and salt restriction - Continue high dose steroids - nystatin for candidiasis - low threshold for parecentesis.     LOS: 1 Russell Grant 9/24/20219:37 AM

## 2020-05-31 NOTE — Progress Notes (Signed)
Mobility Specialist - Progress Note   05/31/20 1400  Mobility  Activity Contraindicated/medical hold  Mobility performed by Mobility specialist    Per chart review, pt's K+ levels are currently 5.2, sitting outside safety guidelines for mobility. Will attempt session at another date/time.    Kathee Delton Mobility Specialist 05/31/20, 2:23 PM

## 2020-05-31 NOTE — Progress Notes (Signed)
PROGRESS NOTE    Russell Grant  BPZ:[id number] DOB: [birth year] DOA: 05/30/2020 PCP: [place]   Brief Narrative:  49 year old male with recent diagnosis of FSGS with tip lesions.  He was seen in the nephrology office by Dr. Juleen China.  Started on high-dose steroids and torsemide at that time.  Has not responded to outpatient torsemide and presented to [facility] on 05/30/2020.  Seen by nephrology consultant.  Currently recommending high-dose Lasix 60 mg IV every 8 hours in addition to albumin boluses 25 g 3 times daily.  Monitor urine output carefully.  Daily weights.  Fluid restrict, salt restrict.  Patient diuresing well.  2 and half liters net negative over admission.  Abdominal ultrasound performed and demonstrate small volume ascites however patient does not wish for paracentesis.  Also refusing Lovenox.  SCDs applied.   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 Creatinine improving over interval Diuresis as above Daily renal function  Skin fungal infection Related to pannus Topical nystatin  Thrombocytopenia Unclear etiology Possibly related to acute illness Continue to monitor with daily CBCs  Leukocytosis Neutrophilic predominance Suspect secondary to high-dose steroids No clear evidence of infection  Morbid obesity BMI 45 This complicates overall care    DVT prophylaxis: SCDs Code Status: Full Family Communication: None today  disposition Plan: Status is: Inpatient  Remains inpatient appropriate because:Inpatient level of care appropriate due to  severity of illness   Dispo: The patient is from: Home              Anticipated d/c is to: Home              Anticipated d/c date is: 3 days              Patient currently is not medically stable to d/c.   Still diffusely fluid overload in the setting of nephrotic syndrome.  Will need several additional days of IV diuresis.      Consultants:   Nephrology  Procedures:   None  Antimicrobials:   None   Subjective: Seen and examined.  Endorses some fatigue but no other pain complaints.  Objective: Vitals:   05/31/20 0449 05/31/20 0600 05/31/20 0954 05/31/20 1316  BP: (!) 154/77  (!) 169/74 135/71  Pulse: 77  90 87  Resp: 20  14 18   Temp: (!) 97.5 F (36.4 C)  97.9 F (36.6 C) (!) 97.5 F (36.4 C)  TempSrc: Oral  Oral Oral  SpO2: 98%  96% 97%  Weight:  (!) 142 kg    Height:        Intake/Output Summary (Last 24 hours) at 05/31/2020 1349 Last data filed at 05/31/2020 1155 Gross per 24 hour  Intake 1060 ml  Output 3305 ml  Net -2245 ml   Filed Weights   05/29/20 1930 05/31/20 0600  Weight: (!) 146.5 kg (!) 142 kg    Examination:  General exam: Appears calm and comfortable  Respiratory system: Clear to auscultation. Respiratory effort normal. Cardiovascular system: S1 & S2 heard, RRR. No JVD, murmurs, rubs, gallops or clicks.  3+ pedal edema to level of knees Gastrointestinal  system: Mild distention, pitting edema noted.  Nontender.  Positive bowel sounds Central nervous system: Alert and oriented. No focal neurological deficits. Extremities: Symmetric 5 x 5 power. Skin: No rashes, lesions or ulcers Psychiatry: Judgement and insight appear normal. Mood & affect appropriate.     Data Reviewed: I have personally reviewed following labs and imaging studies  CBC: Recent Labs  Lab 05/29/20 1933 05/30/20 0300 05/31/20 0844  WBC 21.8* 16.7* 16.7*  NEUTROABS  --   --  14.9*  HGB 15.6 13.9 12.9*  HCT 48.2 40.6 38.2*  MCV 92.3 90.0 90.5  PLT 185 146*  962*   Basic Metabolic Panel: Recent Labs  Lab 05/29/20 1933 05/30/20 0300 05/31/20 0844  NA 136 138 142  K 4.9 4.9 5.2*  CL 101 103 102  CO2 25 26 32  GLUCOSE 150* 218* 158*  BUN 66* 65* 60*  CREATININE 1.98* 2.01* 1.64*  CALCIUM 8.1* 7.5* 8.3*   GFR: Estimated Creatinine Clearance: 76.7 mL/min (A) (by C-G formula based on SCr of 1.64 mg/dL (H)). Liver Function Tests: Recent Labs  Lab 05/29/20 1933  AST 61*  ALT 118*  ALKPHOS 88  BILITOT 0.8  PROT 4.8*  ALBUMIN 1.9*   No results for input(s): LIPASE, AMYLASE in the last 168 hours. No results for input(s): AMMONIA in the last 168 hours. Coagulation Profile: No results for input(s): INR, PROTIME in the last 168 hours. Cardiac Enzymes: No results for input(s): CKTOTAL, CKMB, CKMBINDEX, TROPONINI in the last 168 hours. BNP (last 3 results) No results for input(s): PROBNP in the last 8760 hours. HbA1C: No results for input(s): HGBA1C in the last 72 hours. CBG: No results for input(s): GLUCAP in the last 168 hours. Lipid Profile: No results for input(s): CHOL, HDL, LDLCALC, TRIG, CHOLHDL, LDLDIRECT in the last 72 hours. Thyroid Function Tests: No results for input(s): TSH, T4TOTAL, FREET4, T3FREE, THYROIDAB in the last 72 hours. Anemia Panel: No results for input(s): VITAMINB12, FOLATE, FERRITIN, TIBC, IRON, RETICCTPCT in the last 72 hours. Sepsis Labs: No results for input(s): PROCALCITON, LATICACIDVEN in the last 168 hours.  Recent Results (from the past 240 hour(s))  SARS Coronavirus 2 by RT PCR (hospital order, performed in [facility] hospital lab) Nasopharyngeal Nasopharyngeal Swab     Status: None   Collection Time: 05/30/20  1:10 AM   Specimen: Nasopharyngeal Swab  Result Value Ref Range Status   SARS Coronavirus 2 NEGATIVE NEGATIVE Final    Comment: (NOTE) SARS-CoV-2 target nucleic acids are NOT DETECTED.  The SARS-CoV-2 RNA is generally detectable in upper and lower respiratory specimens during the  acute phase of infection. The lowest concentration of SARS-CoV-2 viral copies this assay can detect is 250 copies / mL. A negative result does not preclude SARS-CoV-2 infection and should not be used as the sole basis for treatment or other patient management decisions.  A negative result may occur with improper specimen collection / handling, submission of specimen other than nasopharyngeal swab, presence of viral mutation(s) within the areas targeted by this assay, and inadequate number of viral copies (<250 copies / mL). A negative result must be combined with clinical observations, patient history, and epidemiological information.  Fact Sheet for Patients:   StrictlyIdeas.no  Fact Sheet for Healthcare Providers: BankingDealers.co.za  This test is not yet approved or  cleared by the [place] FDA and has been authorized for detection and/or diagnosis of SARS-CoV-2 by FDA under an Emergency Use Authorization (EUA).  This EUA will remain in effect (meaning this  test can be used) for the duration of the COVID-19 declaration under Section 564(b)(1) of the Act, 21 U.S.C. section 360bbb-3(b)(1), unless the authorization is terminated or revoked sooner.  Performed at [facility], [address] [place], [place] [address]          Radiology Studies: US Abdomen Complete  Result Date: 05/30/2020 CLINICAL DATA:  Diffuse abdominal pain EXAM: ABDOMEN ULTRASOUND COMPLETE COMPARISON:  Renal sonogram from May 07, 2020 FINDINGS: Gallbladder: No gallstones or wall thickening visualized. No sonographic Murphy sign noted by sonographer. Common bile duct: Diameter: 4 mm Liver: Echogenicity mildly increased in hepatic parenchyma. Assessment of hepatic parenchyma limited by increased echogenicity and patient body habitus. Portal vein is patent on color Doppler imaging with normal direction of blood flow towards the liver. IVC: No  abnormality visualized. Pancreas: Not well visualized. No gross abnormality in the visualized portion. Spleen: Size and appearance within normal limits. Right Kidney: Length: 11.7 cm. No hydronephrosis with a 4.4 x 4.1 x 3.9 cm cyst in the upper pole. Left Kidney: Length: 11.2 cm. No hydronephrosis, visible lesion or nephrolithiasis on limited assessment. Abdominal aorta: Distal portion of the aorta obscured by overlying bowel gas. Maximum AP diameter 2.5 cm. Other findings: RIGHT pleural effusion and small to moderate ascites. IMPRESSION: 1. Mild increased hepatic echogenicity suggest background hepatic steatosis. 2. No hydronephrosis. 3. RIGHT pleural effusion and small to moderate volume of ascites in keeping with the patient's history of anasarca. 4. 2.5 cm mild dilation of the upper abdominal aorta. Distal aorta not well visualized. Electronically Signed   By: Zetta Bills M.D.   On: 05/30/2020 14:16        Scheduled Meds: . aspirin  81 mg Oral Daily  . benazepril  20 mg Oral Daily  . furosemide  60 mg Intravenous Q8H  . loratadine  10 mg Oral Daily  . multivitamin with minerals   Oral Daily  . nystatin   Topical TID  . predniSONE  80 mg Oral Q breakfast  . simvastatin  40 mg Oral Daily   Continuous Infusions: . albumin human 25 g (05/31/20 1318)     LOS: 1 day    Time spent: 25 minutes    Sidney Ace, MD [facility] Pager [phone]  If 7PM-7AM, please contact night-coverage  05/31/2020, 1:49 PM

## 2020-06-01 LAB — BASIC METABOLIC PANEL
Anion gap: 6 (ref 5–15)
BUN: 69 mg/dL — ABNORMAL HIGH (ref 6–20)
CO2: 30 mmol/L (ref 22–32)
Calcium: 8 mg/dL — ABNORMAL LOW (ref 8.9–10.3)
Chloride: 108 mmol/L (ref 98–111)
Creatinine, Ser: 1.58 mg/dL — ABNORMAL HIGH (ref 0.61–1.24)
GFR calc Af Amer: 58 mL/min — ABNORMAL LOW (ref >60)
GFR calc non Af Amer: 50 mL/min — ABNORMAL LOW (ref >60)
Glucose, Bld: 116 mg/dL — ABNORMAL HIGH (ref 70–99)
Potassium: 4.7 mmol/L (ref 3.5–5.1)
Sodium: 144 mmol/L (ref 135–145)

## 2020-06-01 LAB — CBC WITH DIFFERENTIAL/PLATELET
Abs Immature Granulocytes: 0.17 10*3/uL — ABNORMAL HIGH (ref 0.00–0.07)
Basophils Absolute: 0 10*3/uL (ref 0.0–0.1)
Basophils Relative: 0 %
Eosinophils Absolute: 0 10*3/uL (ref 0.0–0.5)
Eosinophils Relative: 0 %
HCT: 33.9 % — ABNORMAL LOW (ref 39.0–52.0)
Hemoglobin: 11.2 g/dL — ABNORMAL LOW (ref 13.0–17.0)
Immature Granulocytes: 1 %
Lymphocytes Relative: 5 %
Lymphs Abs: 0.7 10*3/uL (ref 0.7–4.0)
MCH: 30.5 pg (ref 26.0–34.0)
MCHC: 33 g/dL (ref 30.0–36.0)
MCV: 92.4 fL (ref 80.0–100.0)
Monocytes Absolute: 0.7 10*3/uL (ref 0.1–1.0)
Monocytes Relative: 5 %
Neutro Abs: 12.3 10*3/uL — ABNORMAL HIGH (ref 1.7–7.7)
Neutrophils Relative %: 89 %
Platelets: 102 10*3/uL — ABNORMAL LOW (ref 150–400)
RBC: 3.67 MIL/uL — ABNORMAL LOW (ref 4.22–5.81)
RDW: 13.7 % (ref 11.5–15.5)
WBC: 13.9 10*3/uL — ABNORMAL HIGH (ref 4.0–10.5)
nRBC: 0 % (ref 0.0–0.2)

## 2020-06-01 LAB — HEPATIC FUNCTION PANEL
ALT: 47 U/L — ABNORMAL HIGH (ref 0–44)
AST: 26 U/L (ref 15–41)
Albumin: 2.5 g/dL — ABNORMAL LOW (ref 3.5–5.0)
Alkaline Phosphatase: 58 U/L (ref 38–126)
Bilirubin, Direct: <0.1 mg/dL (ref 0.0–0.2)
Total Bilirubin: 0.9 mg/dL (ref 0.3–1.2)
Total Protein: 4.3 g/dL — ABNORMAL LOW (ref 6.5–8.1)

## 2020-06-01 MED ORDER — SENNOSIDES-DOCUSATE SODIUM 8.6-50 MG PO TABS
1.0000 | ORAL_TABLET | Freq: Every day | ORAL | Status: DC
  Administered 2020-06-01 – 2020-06-03 (×3): 1 via ORAL
  Filled 2020-06-01 (×3): qty 1

## 2020-06-01 NOTE — Progress Notes (Signed)
Russell Grant  MRN: [id number]  DOB/AGE: [birth year] 49 y.o.  Primary [place] date: 05/30/2020  Chief Complaint:  Chief Complaint  Patient presents with   Abdominal Pain    S-Pt presented on  05/30/2020 with  Chief Complaint  Patient presents with   Abdominal Pain  . Patient says he feels better than before.   Medications   aspirin  81 mg Oral Daily   benazepril  20 mg Oral Daily   furosemide  60 mg Intravenous Q8H   loratadine  10 mg Oral Daily   multivitamin with minerals   Oral Daily   nystatin   Topical TID   predniSONE  80 mg Oral Q breakfast   simvastatin  40 mg Oral Daily         GNO:IBBCW from the symptoms mentioned above,there are no other symptoms referable to all systems reviewed.  Physical Exam: Vital signs in last 24 hours: Temp:  [97.5 F (36.4 C)-97.8 F (36.6 C)] 97.8 F (36.6 C) (09/25 0554) Pulse Rate:  [71-98] 71 (09/25 0554) Resp:  [18-20] 18 (09/25 0554) BP: (135-176)/(66-72) 143/66 (09/25 0554) SpO2:  [97 %-98 %] 97 % (09/25 0554) Weight change:  Last BM Date: 06/01/20  Intake/Output from previous day: 09/24 0701 - 09/25 0700 In: 480 [P.O.:480] Out: 2160 [Urine:2160] Total I/O In: -  Out: 1100 [Urine:1100]   Physical Exam: General- pt is awake,alert, oriented to time place and person Resp- No acute REsp distress, decreased breath sound at bases CVS- S1S2 regular in rate and rhythm GIT- BS+, soft, NT, ND, morbidly obese EXT- 2+ LE Edema, no cyanosis   Lab Results: CBC Recent Labs    05/31/20 0844 06/01/20 0517  WBC 16.7* 13.9*  HGB 12.9* 11.2*  HCT 38.2* 33.9*  PLT 126* 102*    BMET Recent Labs    05/31/20 0844 06/01/20 0517  NA 142 144  K 5.2* 4.7  CL 102 108  CO2 32 30  GLUCOSE 158* 116*  BUN 60* 69*  CREATININE 1.64* 1.58*  CALCIUM 8.3* 8.0*   Creatinine trend 2021 2.2==>1.6 1.5--1.7 baseline  MICRO Recent Results (from the past 240 hour(s))  SARS  Coronavirus 2 by RT PCR (hospital order, performed in [facility] hospital lab) Nasopharyngeal Nasopharyngeal Swab     Status: None   Collection Time: 05/30/20  1:10 AM   Specimen: Nasopharyngeal Swab  Result Value Ref Range Status   SARS Coronavirus 2 NEGATIVE NEGATIVE Final    Comment: (NOTE) SARS-CoV-2 target nucleic acids are NOT DETECTED.  The SARS-CoV-2 RNA is generally detectable in upper and lower respiratory specimens during the acute phase of infection. The lowest concentration of SARS-CoV-2 viral copies this assay can detect is 250 copies / mL. A negative result does not preclude SARS-CoV-2 infection and should not be used as the sole basis for treatment or other patient management decisions.  A negative result may occur with improper specimen collection / handling, submission of specimen other than nasopharyngeal swab, presence of viral mutation(s) within the areas targeted by this assay, and inadequate number of viral copies (<250 copies / mL). A negative result must be combined with clinical observations, patient history, and epidemiological information.  Fact Sheet for Patients:   StrictlyIdeas.no  Fact Sheet for Healthcare Providers: BankingDealers.co.za  This test is not yet approved or  cleared by the [place] FDA and has been authorized for detection and/or diagnosis of SARS-CoV-2 by FDA under an Emergency Use Authorization (EUA).  This  EUA will remain in effect (meaning this test can be used) for the duration of the COVID-19 declaration under Section 564(b)(1) of the Act, 21 U.S.C. section 360bbb-3(b)(1), unless the authorization is terminated or revoked sooner.  Performed at [facility], [address] [place], [place] [address]       Lab Results  Component Value Date   CALCIUM 8.0 (L) 06/01/2020   PHOS 3.3 11/28/2019               Impression:   [name] is a  49 year old Caucasian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 with tip lesions, hypertension, hyperlipidemia, and allergies who is admitted to [facility] on 05/30/2020 for  Anasarca [R60.1] Abdominal pain [R10.9]   1)Renal  AKI secondary to ATN Patient has AKI on CKD Patient has CKD stage IIIb Patient has CKD stage IIIb secondary to FSGS  Patient AKI is little better Patient is on steroids  2)HTN Blood pressure is well controlled   3)Anemia of chronic disease  HGb at goal (9--11) No need for Epogen  4) anasarca. Patient is on diuretics Patient is 5 L negative  5) proteinuria Patient has nephrotic range proteinuria Patient is currently on steroids and Benzapril  6) electrolytes   sodium Normonatremic   potassium Hyperkalemia Now better    7)Acid base Co2 at goal     Plan:   We will continue current treatment plan    Jalie Eiland s Theador Hawthorne 06/01/2020, 12:11 PM

## 2020-06-01 NOTE — Progress Notes (Signed)
PROGRESS NOTE    Russell Grant  TKP:[id number] DOB: 05-24-1971 DOA: 05/30/2020 PCP: [place]   Brief Narrative:  49 year old male with recent diagnosis of FSGS with tip lesions.  He was seen in the nephrology office by Dr. Juleen China.   Rule out any severe disease type I disorder low-grade which resolved without treatment leukocytosis improved without antibiotic symptoms on high-dose steroids and torsemide at that time.  Has not responded to outpatient torsemide and presented to [facility] on 05/30/2020.  Seen by nephrology consultant.  Currently recommending high-dose Lasix 60 mg IV every 8 hours in addition to albumin boluses 25 g 3 times daily.  Monitor urine output carefully.  Daily weights.  Fluid restrict, salt restrict.  Patient diuresing well.  2 and half liters net negative over admission.  Abdominal ultrasound performed and demonstrate small volume ascites however patient does not wish for paracentesis.  Also refusing Lovenox.  SCDs applied.  Continues to diurese well. 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 Creatinine improving over interval Diuresis as above Daily renal function  Skin fungal infection Related to pannus Topical nystatin  Thrombocytopenia Unclear etiology Possibly related to acute illness Continue to monitor with daily CBCs  Leukocytosis Neutrophilic predominance Suspect secondary to high-dose steroids No clear evidence of infection  Morbid obesity BMI 45 This complicates overall care    DVT  prophylaxis: SCDs Code Status: Full Family Communication: Wife at bedside disposition Plan: Status is: Inpatient  Remains inpatient appropriate because:Inpatient level of care appropriate due to severity of illness   Dispo: The patient is from: Home              Anticipated d/c is to: Home              Anticipated d/c date is: 3 days              Patient currently is not medically stable to d/c.   Still diffusely fluid overload in the setting of nephrotic syndrome.  Will need several additional days of IV diuresis.      Consultants:   Nephrology  Procedures:   None  Antimicrobials:   None   Subjective: Seen and examined. Endorses pain in bilateral legs Objective: Vitals:   05/31/20 0954 05/31/20 1316 05/31/20 2113 06/01/20 0554  BP: (!) 169/74 135/71 (!) 176/72 (!) 143/66  Pulse: 90 87 98 71  Resp: 14 18 20 18   Temp: 97.9 F (36.6 C) (!) 97.5 F (36.4 C) 97.6 F (36.4 C) 97.8 F (36.6 C)  TempSrc: Oral Oral Oral Oral  SpO2: 96% 97% 98% 97%  Weight:      Height:        Intake/Output Summary (Last 24 hours) at 06/01/2020 1246 Last data filed at 06/01/2020 0900 Gross per 24 hour  Intake --  Output 2480 ml  Net -2480 ml   Filed Weights   05/29/20 1930 05/31/20 0600  Weight: (!) 146.5 kg (!) 142 kg    Examination:  General exam: Appears calm and comfortable  Respiratory system: Clear to auscultation. Respiratory  effort normal. Cardiovascular system: S1 & S2 heard, RRR. No JVD, murmurs, rubs, gallops or clicks.  2+ pedal edema to level of knees Gastrointestinal system: Obese, distention noted, pitting edema, nontender, positive bowel sounds  Central nervous system: Alert and oriented. No focal neurological deficits. Extremities: Symmetric 5 x 5 power. Skin: No rashes, lesions or ulcers Psychiatry: Judgement and insight appear normal. Mood & affect appropriate.     Data Reviewed: I have personally reviewed following labs and imaging  studies  CBC: Recent Labs  Lab 05/29/20 1933 05/30/20 0300 05/31/20 0844 06/01/20 0517  WBC 21.8* 16.7* 16.7* 13.9*  NEUTROABS  --   --  14.9* 12.3*  HGB 15.6 13.9 12.9* 11.2*  HCT 48.2 40.6 38.2* 33.9*  MCV 92.3 90.0 90.5 92.4  PLT 185 146* 126* 633*   Basic Metabolic Panel: Recent Labs  Lab 05/29/20 1933 05/30/20 0300 05/31/20 0844 06/01/20 0517  NA 136 138 142 144  K 4.9 4.9 5.2* 4.7  CL 101 103 102 108  CO2 25 26 32 30  GLUCOSE 150* 218* 158* 116*  BUN 66* 65* 60* 69*  CREATININE 1.98* 2.01* 1.64* 1.58*  CALCIUM 8.1* 7.5* 8.3* 8.0*   GFR: Estimated Creatinine Clearance: 79.6 mL/min (A) (by C-G formula based on SCr of 1.58 mg/dL (H)). Liver Function Tests: Recent Labs  Lab 05/29/20 1933 06/01/20 0517  AST 61* 26  ALT 118* 47*  ALKPHOS 88 58  BILITOT 0.8 0.9  PROT 4.8* 4.3*  ALBUMIN 1.9* 2.5*   No results for input(s): LIPASE, AMYLASE in the last 168 hours. No results for input(s): AMMONIA in the last 168 hours. Coagulation Profile: No results for input(s): INR, PROTIME in the last 168 hours. Cardiac Enzymes: No results for input(s): CKTOTAL, CKMB, CKMBINDEX, TROPONINI in the last 168 hours. BNP (last 3 results) No results for input(s): PROBNP in the last 8760 hours. HbA1C: No results for input(s): HGBA1C in the last 72 hours. CBG: No results for input(s): GLUCAP in the last 168 hours. Lipid Profile: No results for input(s): CHOL, HDL, LDLCALC, TRIG, CHOLHDL, LDLDIRECT in the last 72 hours. Thyroid Function Tests: No results for input(s): TSH, T4TOTAL, FREET4, T3FREE, THYROIDAB in the last 72 hours. Anemia Panel: No results for input(s): VITAMINB12, FOLATE, FERRITIN, TIBC, IRON, RETICCTPCT in the last 72 hours. Sepsis Labs: No results for input(s): PROCALCITON, LATICACIDVEN in the last 168 hours.  Recent Results (from the past 240 hour(s))  SARS Coronavirus 2 by RT PCR (hospital order, performed in [facility] hospital lab) Nasopharyngeal  Nasopharyngeal Swab     Status: None   Collection Time: 05/30/20  1:10 AM   Specimen: Nasopharyngeal Swab  Result Value Ref Range Status   SARS Coronavirus 2 NEGATIVE NEGATIVE Final    Comment: (NOTE) SARS-CoV-2 target nucleic acids are NOT DETECTED.  The SARS-CoV-2 RNA is generally detectable in upper and lower respiratory specimens during the acute phase of infection. The lowest concentration of SARS-CoV-2 viral copies this assay can detect is 250 copies / mL. A negative result does not preclude SARS-CoV-2 infection and should not be used as the sole basis for treatment or other patient management decisions.  A negative result may occur with improper specimen collection / handling, submission of specimen other than nasopharyngeal swab, presence of viral mutation(s) within the areas targeted by this assay, and inadequate number of viral copies (<250 copies / mL). A negative result must be combined with clinical observations, patient history, and epidemiological information.  Fact Sheet for Patients:  StrictlyIdeas.no  Fact Sheet for Healthcare Providers: BankingDealers.co.za  This test is not yet approved or  cleared by the [place] FDA and has been authorized for detection and/or diagnosis of SARS-CoV-2 by FDA under an Emergency Use Authorization (EUA).  This EUA will remain in effect (meaning this test can be used) for the duration of the COVID-19 declaration under Section 564(b)(1) of the Act, 21 U.S.C. section 360bbb-3(b)(1), unless the authorization is terminated or revoked sooner.  Performed at [facility], [address], [place], [place] [address]          Radiology Studies: US Abdomen Complete  Result Date: 05/30/2020 CLINICAL DATA:  Diffuse abdominal pain EXAM: ABDOMEN ULTRASOUND COMPLETE COMPARISON:  Renal sonogram from May 07, 2020 FINDINGS: Gallbladder: No gallstones or wall thickening  visualized. No sonographic Murphy sign noted by sonographer. Common bile duct: Diameter: 4 mm Liver: Echogenicity mildly increased in hepatic parenchyma. Assessment of hepatic parenchyma limited by increased echogenicity and patient body habitus. Portal vein is patent on color Doppler imaging with normal direction of blood flow towards the liver. IVC: No abnormality visualized. Pancreas: Not well visualized. No gross abnormality in the visualized portion. Spleen: Size and appearance within normal limits. Right Kidney: Length: 11.7 cm. No hydronephrosis with a 4.4 x 4.1 x 3.9 cm cyst in the upper pole. Left Kidney: Length: 11.2 cm. No hydronephrosis, visible lesion or nephrolithiasis on limited assessment. Abdominal aorta: Distal portion of the aorta obscured by overlying bowel gas. Maximum AP diameter 2.5 cm. Other findings: RIGHT pleural effusion and small to moderate ascites. IMPRESSION: 1. Mild increased hepatic echogenicity suggest background hepatic steatosis. 2. No hydronephrosis. 3. RIGHT pleural effusion and small to moderate volume of ascites in keeping with the patient's history of anasarca. 4. 2.5 cm mild dilation of the upper abdominal aorta. Distal aorta not well visualized. Electronically Signed   By: Zetta Bills M.D.   On: 05/30/2020 14:16        Scheduled Meds: . aspirin  81 mg Oral Daily  . benazepril  20 mg Oral Daily  . furosemide  60 mg Intravenous Q8H  . loratadine  10 mg Oral Daily  . multivitamin with minerals   Oral Daily  . nystatin   Topical TID  . predniSONE  80 mg Oral Q breakfast  . simvastatin  40 mg Oral Daily   Continuous Infusions: . sodium chloride 250 mL (05/31/20 2231)  . albumin human 25 g (06/01/20 0658)     LOS: 2 days    Time spent: 25 minutes    Sidney Ace, MD [facility] Pager [phone]  If 7PM-7AM, please contact night-coverage  06/01/2020, 12:46 PM

## 2020-06-01 NOTE — Progress Notes (Signed)
Mobility Specialist - Progress Note   06/01/20 1140  Mobility  Activity Ambulated in room;Ambulated in hall  Level of Assistance Independent  Assistive Device None  Distance Ambulated (ft) 480 ft  Mobility Response Tolerated well  Mobility performed by Mobility specialist  $Mobility charge 1 Mobility    Pt sitting EOB w/ wife present in room. Pt agreed to session. Noted pt has ambulated multiple laps around nursing station independently prior to session, but is still willing to participate. Pt ambulated ~480' in room and hallway independently. CGA utilized for safety. No LOB noted. No c/o SOB. Pt states he was having "a little bit" of pain in his feet, but states "that's normal" d/t current swelling. Overall, pt tolerated session very well. Pt was pleasant and motivated t/o session. At the end of session, pt left sitting EOB w/ wife present in room. Call bell and phone placed in reach.     Salomon Ganser Mobility Specialist  06/01/20, 11:47 AM

## 2020-06-02 LAB — CBC WITH DIFFERENTIAL/PLATELET
Abs Immature Granulocytes: 0.16 10*3/uL — ABNORMAL HIGH (ref 0.00–0.07)
Basophils Absolute: 0 10*3/uL (ref 0.0–0.1)
Basophils Relative: 0 %
Eosinophils Absolute: 0 10*3/uL (ref 0.0–0.5)
Eosinophils Relative: 0 %
HCT: 32 % — ABNORMAL LOW (ref 39.0–52.0)
Hemoglobin: 10.4 g/dL — ABNORMAL LOW (ref 13.0–17.0)
Immature Granulocytes: 1 %
Lymphocytes Relative: 5 %
Lymphs Abs: 0.6 10*3/uL — ABNORMAL LOW (ref 0.7–4.0)
MCH: 29.8 pg (ref 26.0–34.0)
MCHC: 32.5 g/dL (ref 30.0–36.0)
MCV: 91.7 fL (ref 80.0–100.0)
Monocytes Absolute: 0.6 10*3/uL (ref 0.1–1.0)
Monocytes Relative: 5 %
Neutro Abs: 10.8 10*3/uL — ABNORMAL HIGH (ref 1.7–7.7)
Neutrophils Relative %: 89 %
Platelets: 102 10*3/uL — ABNORMAL LOW (ref 150–400)
RBC: 3.49 MIL/uL — ABNORMAL LOW (ref 4.22–5.81)
RDW: 13.5 % (ref 11.5–15.5)
WBC: 12.1 10*3/uL — ABNORMAL HIGH (ref 4.0–10.5)
nRBC: 0 % (ref 0.0–0.2)

## 2020-06-02 LAB — BASIC METABOLIC PANEL
Anion gap: 7 (ref 5–15)
BUN: 62 mg/dL — ABNORMAL HIGH (ref 6–20)
CO2: 30 mmol/L (ref 22–32)
Calcium: 8 mg/dL — ABNORMAL LOW (ref 8.9–10.3)
Chloride: 108 mmol/L (ref 98–111)
Creatinine, Ser: 1.46 mg/dL — ABNORMAL HIGH (ref 0.61–1.24)
GFR calc Af Amer: >60 mL/min (ref >60)
GFR calc non Af Amer: 55 mL/min — ABNORMAL LOW (ref >60)
Glucose, Bld: 147 mg/dL — ABNORMAL HIGH (ref 70–99)
Potassium: 4.5 mmol/L (ref 3.5–5.1)
Sodium: 145 mmol/L (ref 135–145)

## 2020-06-02 NOTE — Progress Notes (Signed)
PROGRESS NOTE    Russell Grant  OXB:[id number] DOB: 1971-05-07 DOA: 05/30/2020 PCP: [place]   Brief Narrative:  49 year old male with recent diagnosis of FSGS with tip lesions.  He was seen in the nephrology office by Dr. Juleen China.   Rule out any severe disease type I disorder low-grade which resolved without treatment leukocytosis improved without antibiotic symptoms on high-dose steroids and torsemide at that time.  Has not responded to outpatient torsemide and presented to [facility] on 05/30/2020.  Seen by nephrology consultant.  Currently recommending high-dose Lasix 60 mg IV every 8 hours in addition to albumin boluses 25 g 3 times daily.  Monitor urine output carefully.  Daily weights.  Fluid restrict, salt restrict.  Patient diuresing well.  2 and half liters net negative over admission.  Abdominal ultrasound performed and demonstrate small volume ascites however patient does not wish for paracentesis.  Also refusing Lovenox.  SCDs applied.  Continues to diurese well. 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 Creatinine improving over interval Diuresis as above Daily renal function  Skin fungal infection Related to pannus Topical nystatin  Thrombocytopenia Unclear etiology Possibly related to acute illness Continue to monitor with daily CBCs  Leukocytosis Neutrophilic predominance Suspect secondary to high-dose steroids No clear evidence of infection  Morbid obesity BMI 45 This  complicates overall care    DVT prophylaxis: SCDs Code Status: Full Family Communication: Wife at bedside disposition Plan: Status is: Inpatient  Remains inpatient appropriate because:Inpatient level of care appropriate due to severity of illness   Dispo: The patient is from: Home              Anticipated d/c is to: Home              Anticipated d/c date is: 3 days              Patient currently is not medically stable to d/c.   Still diffusely fluid overload in the setting of nephrotic syndrome.  Will need several additional days of IV diuresis.   Consultants:   Nephrology  Procedures:   None  Antimicrobials:   None   Subjective: Seen and examined.  No pain complaints today  Objective: Vitals:   06/01/20 0554 06/01/20 1356 06/01/20 2047 06/02/20 0416  BP: (!) 143/66 126/63 (!) 147/74 123/62  Pulse: 71 73 79 (!) 53  Resp: 18 20 20 18   Temp: 97.8 F (36.6 C) 98.1 F (36.7 C) 97.8 F (36.6 C) 98 F (36.7 C)  TempSrc: Oral Oral Oral Oral  SpO2: 97% 97% 97% 97%  Weight:      Height:        Intake/Output Summary (Last 24 hours) at 06/02/2020 1206 Last data filed at 06/02/2020 1106 Gross per 24 hour  Intake 707.76 ml  Output 3500 ml  Net -2792.24 ml   Filed Weights   05/29/20 1930 05/31/20 0600  Weight: (!) 146.5 kg (!) 142 kg    Examination:   General exam: Appears calm and comfortable  Respiratory system: Clear to  auscultation. Respiratory effort normal. Cardiovascular system: S1 & S2 heard, RRR. No JVD, murmurs, rubs, gallops or clicks.  2+ pedal edema to level of knees Gastrointestinal system: Obese, distention noted, pitting edema, nontender, positive bowel sounds  Central nervous system: Alert and oriented. No focal neurological deficits. Extremities: Symmetric 5 x 5 power. Skin: No rashes, lesions or ulcers Psychiatry: Judgement and insight appear normal. Mood & affect appropriate.    Data Reviewed: I have personally reviewed following  labs and imaging studies  CBC: Recent Labs  Lab 05/29/20 1933 05/30/20 0300 05/31/20 0844 06/01/20 0517 06/02/20 0319  WBC 21.8* 16.7* 16.7* 13.9* 12.1*  NEUTROABS  --   --  14.9* 12.3* 10.8*  HGB 15.6 13.9 12.9* 11.2* 10.4*  HCT 48.2 40.6 38.2* 33.9* 32.0*  MCV 92.3 90.0 90.5 92.4 91.7  PLT 185 146* 126* 102* 712*   Basic Metabolic Panel: Recent Labs  Lab 05/29/20 1933 05/30/20 0300 05/31/20 0844 06/01/20 0517 06/02/20 0319  NA 136 138 142 144 145  K 4.9 4.9 5.2* 4.7 4.5  CL 101 103 102 108 108  CO2 25 26 32 30 30  GLUCOSE 150* 218* 158* 116* 147*  BUN 66* 65* 60* 69* 62*  CREATININE 1.98* 2.01* 1.64* 1.58* 1.46*  CALCIUM 8.1* 7.5* 8.3* 8.0* 8.0*   GFR: Estimated Creatinine Clearance: 86.1 mL/min (A) (by C-G formula based on SCr of 1.46 mg/dL (H)). Liver Function Tests: Recent Labs  Lab 05/29/20 1933 06/01/20 0517  AST 61* 26  ALT 118* 47*  ALKPHOS 88 58  BILITOT 0.8 0.9  PROT 4.8* 4.3*  ALBUMIN 1.9* 2.5*   No results for input(s): LIPASE, AMYLASE in the last 168 hours. No results for input(s): AMMONIA in the last 168 hours. Coagulation Profile: No results for input(s): INR, PROTIME in the last 168 hours. Cardiac Enzymes: No results for input(s): CKTOTAL, CKMB, CKMBINDEX, TROPONINI in the last 168 hours. BNP (last 3 results) No results for input(s): PROBNP in the last 8760 hours. HbA1C: No results for input(s): HGBA1C in the last 72 hours. CBG: No results for input(s): GLUCAP in the last 168 hours. Lipid Profile: No results for input(s): CHOL, HDL, LDLCALC, TRIG, CHOLHDL, LDLDIRECT in the last 72 hours. Thyroid Function Tests: No results for input(s): TSH, T4TOTAL, FREET4, T3FREE, THYROIDAB in the last 72 hours. Anemia Panel: No results for input(s): VITAMINB12, FOLATE, FERRITIN, TIBC, IRON, RETICCTPCT in the last 72 hours. Sepsis Labs: No results for input(s): PROCALCITON, LATICACIDVEN in the last 168 hours.  Recent Results (from the past 240  hour(s))  SARS Coronavirus 2 by RT PCR (hospital order, performed in [facility] hospital lab) Nasopharyngeal Nasopharyngeal Swab     Status: None   Collection Time: 05/30/20  1:10 AM   Specimen: Nasopharyngeal Swab  Result Value Ref Range Status   SARS Coronavirus 2 NEGATIVE NEGATIVE Final    Comment: (NOTE) SARS-CoV-2 target nucleic acids are NOT DETECTED.  The SARS-CoV-2 RNA is generally detectable in upper and lower respiratory specimens during the acute phase of infection. The lowest concentration of SARS-CoV-2 viral copies this assay can detect is 250 copies / mL. A negative result does not preclude SARS-CoV-2 infection and should not be used as the sole basis for treatment or other patient management decisions.  A negative result may occur with improper specimen collection / handling, submission of specimen other than nasopharyngeal swab, presence of viral mutation(s) within the areas targeted by this assay, and inadequate number of viral copies (<250 copies / mL). A negative  result must be combined with clinical observations, patient history, and epidemiological information.  Fact Sheet for Patients:   StrictlyIdeas.no  Fact Sheet for Healthcare Providers: BankingDealers.co.za  This test is not yet approved or  cleared by the [place] FDA and has been authorized for detection and/or diagnosis of SARS-CoV-2 by FDA under an Emergency Use Authorization (EUA).  This EUA will remain in effect (meaning this test can be used) for the duration of the COVID-19 declaration under Section 564(b)(1) of the Act, 21 U.S.C. section 360bbb-3(b)(1), unless the authorization is terminated or revoked sooner.  Performed at [facility], [address] [place], [place] [address]          Radiology Studies: No results found.      Scheduled Meds: . aspirin  81 mg Oral Daily  . benazepril  20 mg Oral Daily  .  furosemide  60 mg Intravenous Q8H  . loratadine  10 mg Oral Daily  . multivitamin with minerals   Oral Daily  . nystatin   Topical TID  . predniSONE  80 mg Oral Q breakfast  . senna-docusate  1 tablet Oral QHS  . simvastatin  40 mg Oral Daily   Continuous Infusions: . sodium chloride Stopped (06/01/20 1501)     LOS: 3 days    Time spent: 25 minutes    Sidney Ace, MD [facility] Pager [phone]  If 7PM-7AM, please contact night-coverage  06/02/2020, 12:06 PM

## 2020-06-02 NOTE — Progress Notes (Signed)
Russell Grant  MRN: [id number]  DOB/AGE: June 03, 1971 49 y.o.  Primary [place] date: 05/30/2020  Chief Complaint:  Chief Complaint  Patient presents with  . Abdominal Pain    S-Pt presented on  05/30/2020 with  Chief Complaint  Patient presents with  . Abdominal Pain  . Patient as always is in positive spirits.  Patient says" he is feeling better than before."  Medications  . aspirin  81 mg Oral Daily  . benazepril  20 mg Oral Daily  . furosemide  60 mg Intravenous Q8H  . loratadine  10 mg Oral Daily  . multivitamin with minerals   Oral Daily  . nystatin   Topical TID  . predniSONE  80 mg Oral Q breakfast  . senna-docusate  1 tablet Oral QHS  . simvastatin  40 mg Oral Daily         [id number] from the symptoms mentioned above,there are no other symptoms referable to all systems reviewed.  Physical Exam: Vital signs in last 24 hours: Temp:  [97.8 F (36.6 C)-98.1 F (36.7 C)] 98 F (36.7 C) (09/26 0416) Pulse Rate:  [53-79] 53 (09/26 0416) Resp:  [18-20] 18 (09/26 0416) BP: (123-147)/(62-74) 123/62 (09/26 0416) SpO2:  [97 %] 97 % (09/26 0416) Weight change:  Last BM Date: 06/01/20  Intake/Output from previous day: 09/25 0701 - 09/26 0700 In: 707.8 [I.V.:92.6; IV Piggyback:615.2] Out: 4200 [Urine:4200] No intake/output data recorded.   Physical Exam: General- pt is awake,alert, oriented to time place and person Resp- No acute REsp distress, decreased breath sound at bases CVS- S1S2 regular in rate and rhythm GIT- BS+, soft, NT, ND, morbidly obese EXT- 2+ LE Edema, no cyanosis   Lab Results: CBC Recent Labs    06/01/20 0517 06/02/20 0319  WBC 13.9* 12.1*  HGB 11.2* 10.4*  HCT 33.9* 32.0*  PLT 102* 102*    BMET Recent Labs    06/01/20 0517 06/02/20 0319  NA 144 145  K 4.7 4.5  CL 108 108  CO2 30 30  GLUCOSE 116* 147*  BUN 69* 62*  CREATININE 1.58* 1.46*  CALCIUM 8.0* 8.0*   Creatinine  trend 2021 2.2==>1.46 1.5--1.7 baseline  MICRO Recent Results (from the past 240 hour(s))  SARS Coronavirus 2 by RT PCR (hospital order, performed in [facility] hospital lab) Nasopharyngeal Nasopharyngeal Swab     Status: None   Collection Time: 05/30/20  1:10 AM   Specimen: Nasopharyngeal Swab  Result Value Ref Range Status   SARS Coronavirus 2 NEGATIVE NEGATIVE Final    Comment: (NOTE) SARS-CoV-2 target nucleic acids are NOT DETECTED.  The SARS-CoV-2 RNA is generally detectable in upper and lower respiratory specimens during the acute phase of infection. The lowest concentration of SARS-CoV-2 viral copies this assay can detect is 250 copies / mL. A negative result does not preclude SARS-CoV-2 infection and should not be used as the sole basis for treatment or other patient management decisions.  A negative result may occur with improper specimen collection / handling, submission of specimen other than nasopharyngeal swab, presence of viral mutation(s) within the areas targeted by this assay, and inadequate number of viral copies (<250 copies / mL). A negative result must be combined with clinical observations, patient history, and epidemiological information.  Fact Sheet for Patients:   StrictlyIdeas.no  Fact Sheet for Healthcare Providers: BankingDealers.co.za  This test is not yet approved or  cleared by the [place] FDA and has been authorized for detection and/or  diagnosis of SARS-CoV-2 by FDA under an Emergency Use Authorization (EUA).  This EUA will remain in effect (meaning this test can be used) for the duration of the COVID-19 declaration under Section 564(b)(1) of the Act, 21 U.S.C. section 360bbb-3(b)(1), unless the authorization is terminated or revoked sooner.  Performed at [facility], [address] [place], [place] [address]       Lab Results  Component Value Date   CALCIUM 8.0 (L)  06/02/2020   PHOS 3.3 11/28/2019               Impression:   Mr. SHEA KAPUR is a 49 year old Caucasian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 with tip lesions, hypertension, hyperlipidemia, and allergies who is admitted to [facility] on 05/30/2020 for  Anasarca [R60.1] Abdominal pain [R10.9]   1)Renal  AKI secondary to ATN Patient has AKI on CKD Patient has CKD stage IIIb Patient has CKD stage IIIb secondary to FSGS  Patient AKI is  better Patient is on steroids  2)HTN Blood pressure is well controlled   3)Anemia of chronic disease  HGb at goal (9--11) No need for Epogen  4) anasarca. Patient is on diuretics Patient is 8 L negative  5) proteinuria Patient has nephrotic range proteinuria Patient is currently on steroids and Benzapril  6) electrolytes   sodium Normonatremic   potassium Hyperkalemia Now better    7)Acid base Co2 at goal     Plan:   We will continue current treatment plan    Tatia Petrucci s [facility] 06/02/2020, 8:03 AM

## 2020-06-03 LAB — BASIC METABOLIC PANEL
Anion gap: 6 (ref 5–15)
BUN: 56 mg/dL — ABNORMAL HIGH (ref 6–20)
CO2: 33 mmol/L — ABNORMAL HIGH (ref 22–32)
Calcium: 8.2 mg/dL — ABNORMAL LOW (ref 8.9–10.3)
Chloride: 105 mmol/L (ref 98–111)
Creatinine, Ser: 1.43 mg/dL — ABNORMAL HIGH (ref 0.61–1.24)
GFR calc Af Amer: >60 mL/min (ref >60)
GFR calc non Af Amer: 57 mL/min — ABNORMAL LOW (ref >60)
Glucose, Bld: 106 mg/dL — ABNORMAL HIGH (ref 70–99)
Potassium: 4 mmol/L (ref 3.5–5.1)
Sodium: 144 mmol/L (ref 135–145)

## 2020-06-03 NOTE — Progress Notes (Signed)
PROGRESS NOTE    Russell Grant  STM:[id number] DOB: August 21, 1971 DOA: 05/30/2020 PCP: [facility]   Brief Narrative:  49 year old male with recent diagnosis of FSGS with tip lesions.  He was seen in the nephrology office by Dr. Juleen China.   Rule out any severe disease type I disorder low-grade which resolved without treatment leukocytosis improved without antibiotic symptoms on high-dose steroids and torsemide at that time.  Has not responded to outpatient torsemide and presented to [facility] on 05/30/2020.  Seen by nephrology consultant.  Currently recommending high-dose Lasix 60 mg IV every 8 hours in addition to albumin boluses 25 g 3 times daily.  Monitor urine output carefully.  Daily weights.  Fluid restrict, salt restrict.  Patient diuresing well.  2 and half liters net negative over admission.  Abdominal ultrasound performed and demonstrate small volume ascites however patient does not wish for paracentesis.  Also refusing Lovenox.  SCDs applied.  Continues to diurese well. 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 Creatinine improving over interval Diuresis as above Daily renal function  Skin fungal infection Related to pannus Topical nystatin  Thrombocytopenia Unclear etiology Possibly related to acute illness Continue to monitor with daily CBCs  Leukocytosis Neutrophilic predominance Suspect secondary to high-dose steroids No clear evidence of infection  Morbid obesity BMI  45 This complicates overall care    DVT prophylaxis: SCDs Code Status: Full Family Communication: Wife at bedside disposition Plan: Status is: Inpatient  Remains inpatient appropriate because:Inpatient level of care appropriate due to severity of illness   Dispo: The patient is from: Home              Anticipated d/c is to: Home              Anticipated d/c date is: 3 days              Patient currently is not medically stable to d/c.   Still diffusely fluid overload in the setting of nephrotic syndrome.  Will need several additional days of IV diuresis.   Consultants:   Nephrology  Procedures:   None  Antimicrobials:   None   Subjective: Seen and examined.  No pain complaints today.  In good spirits.  Ambulating around hallway.  Objective: Vitals:   06/02/20 0416 06/02/20 1348 06/02/20 2055 06/03/20 0429  BP: 123/62 (!) 155/65 132/70 (!) 149/73  Pulse: (!) 53 71 68 (!) 59  Resp: 18 (!) 21 18 20   Temp: 98 F (36.7 C) 97.6 F (36.4 C) 97.8 F (36.6 C) 97.7 F (36.5 C)  TempSrc: Oral Oral Oral Oral  SpO2: 97% 97% 96% 98%  Weight:      Height:        Intake/Output Summary (Last 24 hours) at 06/03/2020 1251 Last data filed at 06/03/2020 1036 Gross per 24 hour  Intake 478 ml  Output 4825 ml  Net -4347 ml   Filed Weights   05/29/20 1930 05/31/20 0600  Weight: (!) 146.5 kg (!) 142 kg    Examination:  General exam: Appears calm and comfortable  Respiratory system: Clear to auscultation. Respiratory effort normal. Cardiovascular system: S1 & S2 heard, RRR. No JVD, murmurs, rubs, gallops or clicks.  2+ pedal edema to level of knees Gastrointestinal system: Obese, distention noted, pitting edema, nontender, positive bowel sounds  Central nervous system: Alert and oriented. No focal neurological deficits. Extremities: Symmetric 5 x 5 power. Skin: No rashes, lesions or ulcers Psychiatry: Judgement and insight appear normal. Mood & affect appropriate.     Data Reviewed: I have personally reviewed following labs and imaging studies  CBC: Recent Labs  Lab 05/29/20 1933 05/30/20 0300 05/31/20 0844 06/01/20 0517 06/02/20 0319  WBC 21.8* 16.7* 16.7* 13.9* 12.1*  NEUTROABS  --   --  14.9* 12.3* 10.8*  HGB 15.6 13.9 12.9* 11.2* 10.4*  HCT 48.2 40.6 38.2* 33.9* 32.0*  MCV 92.3 90.0 90.5 92.4 91.7  PLT 185 146* 126* 102* 751*   Basic Metabolic Panel: Recent Labs  Lab 05/30/20 0300 05/31/20 0844 06/01/20 0517 06/02/20 0319 06/03/20 0522  NA 138 142 144 145 144  K 4.9 5.2* 4.7 4.5 4.0  CL 103 102 108 108 105  CO2 26 32 30 30 33*  GLUCOSE 218* 158* 116* 147* 106*  BUN 65* 60* 69* 62* 56*  CREATININE 2.01* 1.64* 1.58* 1.46* 1.43*  CALCIUM 7.5* 8.3* 8.0* 8.0* 8.2*   GFR: Estimated Creatinine Clearance: 87.9 mL/min (A) (by C-G formula based on SCr of 1.43 mg/dL (H)). Liver Function Tests: Recent Labs  Lab 05/29/20 1933 06/01/20 0517  AST 61* 26  ALT 118* 47*  ALKPHOS 88 58  BILITOT 0.8 0.9  PROT 4.8* 4.3*  ALBUMIN 1.9* 2.5*   No results for input(s): LIPASE, AMYLASE in the last 168 hours. No results for input(s): AMMONIA in the last 168 hours. Coagulation Profile: No results for input(s): INR, PROTIME in the last 168 hours. Cardiac Enzymes: No results for input(s): CKTOTAL, CKMB, CKMBINDEX, TROPONINI in the last 168 hours. BNP (last 3 results) No results for input(s): PROBNP in the last 8760 hours. HbA1C: No results for input(s): HGBA1C in the last 72 hours. CBG: No results for input(s): GLUCAP in the last 168 hours. Lipid Profile: No results for input(s): CHOL, HDL, LDLCALC, TRIG, CHOLHDL, LDLDIRECT in the last 72 hours. Thyroid Function Tests: No results for input(s): TSH, T4TOTAL, FREET4, T3FREE, THYROIDAB in the last 72 hours. Anemia Panel: No results for input(s): VITAMINB12, FOLATE, FERRITIN, TIBC, IRON, RETICCTPCT in the last 72 hours. Sepsis Labs: No results for input(s): PROCALCITON, LATICACIDVEN in  the last 168 hours.  Recent Results (from the past 240 hour(s))  SARS Coronavirus 2 by RT PCR (hospital order, performed in [facility] hospital lab) Nasopharyngeal Nasopharyngeal Swab     Status: None   Collection Time: 05/30/20  1:10 AM   Specimen: Nasopharyngeal Swab  Result Value Ref Range Status   SARS Coronavirus 2 NEGATIVE NEGATIVE Final    Comment: (NOTE) SARS-CoV-2 target nucleic acids are NOT DETECTED.  The SARS-CoV-2 RNA is generally detectable in upper and lower respiratory specimens during the acute phase of infection. The lowest concentration of SARS-CoV-2 viral copies this assay can detect is 250 copies / mL. A negative result does not preclude SARS-CoV-2 infection and should not be used as the sole basis for treatment or other patient management decisions.  A negative result may occur with improper specimen collection / handling, submission of specimen other than nasopharyngeal swab, presence of viral mutation(s) within the areas targeted by this assay, and  inadequate number of viral copies (<250 copies / mL). A negative result must be combined with clinical observations, patient history, and epidemiological information.  Fact Sheet for Patients:   StrictlyIdeas.no  Fact Sheet for Healthcare Providers: BankingDealers.co.za  This test is not yet approved or  cleared by the [place] FDA and has been authorized for detection and/or diagnosis of SARS-CoV-2 by FDA under an Emergency Use Authorization (EUA).  This EUA will remain in effect (meaning this test can be used) for the duration of the COVID-19 declaration under Section 564(b)(1) of the Act, 21 U.S.C. section 360bbb-3(b)(1), unless the authorization is terminated or revoked sooner.  Performed at [facility], [address], [place], [place] [address]          Radiology Studies: No results found.      Scheduled Meds: . aspirin  81 mg  Oral Daily  . benazepril  20 mg Oral Daily  . furosemide  60 mg Intravenous Q8H  . loratadine  10 mg Oral Daily  . multivitamin with minerals   Oral Daily  . nystatin   Topical TID  . predniSONE  80 mg Oral Q breakfast  . senna-docusate  1 tablet Oral QHS  . simvastatin  40 mg Oral Daily   Continuous Infusions: . sodium chloride Stopped (06/01/20 1501)     LOS: 4 days    Time spent: 15 minutes    Sidney Ace, MD [facility] Pager [phone]  If 7PM-7AM, please contact night-coverage  06/03/2020, 12:51 PM

## 2020-06-03 NOTE — Progress Notes (Signed)
[place], [place] 06/03/20  Subjective:   Hospital day # 4  Doing fair.  Feels like his lower extremity edema is resolving.  Urine output is good.  Appetite is also good.  Able to eat without any nausea or vomiting  Renal: 09/26 0701 - 09/27 0700 In: 118 [P.O.:118] Out: 4100 [Urine:4100] Lab Results  Component Value Date   CREATININE 1.43 (H) 06/03/2020   CREATININE 1.46 (H) 06/02/2020   CREATININE 1.58 (H) 06/01/2020     Objective:  Vital signs in last 24 hours:  Temp:  [97.6 F (36.4 C)-97.8 F (36.6 C)] 97.7 F (36.5 C) (09/27 0429) Pulse Rate:  [59-71] 59 (09/27 0429) Resp:  [18-21] 20 (09/27 0429) BP: (132-155)/(65-73) 149/73 (09/27 0429) SpO2:  [96 %-98 %] 98 % (09/27 0429)  Weight change:  Filed Weights   05/29/20 1930 05/31/20 0600  Weight: (!) 146.5 kg (!) 142 kg    Intake/Output:    Intake/Output Summary (Last 24 hours) at 06/03/2020 1108 Last data filed at 06/03/2020 1036 Gross per 24 hour  Intake 478 ml  Output 5125 ml  Net -4647 ml    Physical Exam: General:  No acute distress, laying in the bed  HEENT  anicteric, moist oral mucous membrane  Pulm/lungs  normal breathing effort, lungs are clear to auscultation  CVS/Heart  regular rhythm, no rub or gallop  Abdomen:   Soft, nontender  Extremities:  ++ peripheral edema  Neurologic:  Alert, oriented, able to follow commands  Skin:  No acute rashes     Basic Metabolic Panel:  Recent Labs  Lab 05/30/20 0300 05/30/20 0300 05/31/20 0844 05/31/20 0844 06/01/20 0517 06/02/20 0319 06/03/20 0522  NA 138  --  142  --  144 145 144  K 4.9  --  5.2*  --  4.7 4.5 4.0  CL 103  --  102  --  108 108 105  CO2 26  --  32  --  30 30 33*  GLUCOSE 218*  --  158*  --  116* 147* 106*  BUN 65*  --  60*  --  69* 62* 56*  CREATININE 2.01*  --  1.64*  --  1.58* 1.46* 1.43*  CALCIUM 7.5*   < > 8.3*   < > 8.0* 8.0* 8.2*   < > = values in this interval not displayed.     CBC:  Recent Labs  Lab 05/29/20 1933 05/30/20 0300 05/31/20 0844 06/01/20 0517 06/02/20 0319  WBC 21.8* 16.7* 16.7* 13.9* 12.1*  NEUTROABS  --   --  14.9* 12.3* 10.8*  HGB 15.6 13.9 12.9* 11.2* 10.4*  HCT 48.2 40.6 38.2* 33.9* 32.0*  MCV 92.3 90.0 90.5 92.4 91.7  PLT 185 146* 126* 102* 102*      Lab Results  Component Value Date   HEPBSAG NON REACTIVE 05/07/2020   HEPBSAB Reactive (A) 05/07/2020   HEPBIGM NON REACTIVE 05/07/2020      Microbiology:  Recent Results (from the past 240 hour(s))  SARS Coronavirus 2 by RT PCR (hospital order, performed in [facility] hospital lab) Nasopharyngeal Nasopharyngeal Swab     Status: None   Collection Time: 05/30/20  1:10 AM   Specimen: Nasopharyngeal Swab  Result Value Ref Range Status   SARS Coronavirus 2 NEGATIVE NEGATIVE Final    Comment: (NOTE) SARS-CoV-2 target nucleic acids are NOT DETECTED.  The SARS-CoV-2 RNA is generally detectable in upper and lower respiratory specimens during the acute phase of infection. The lowest  concentration of SARS-CoV-2 viral copies this assay can detect is 250 copies / mL. A negative result does not preclude SARS-CoV-2 infection and should not be used as the sole basis for treatment or other patient management decisions.  A negative result may occur with improper specimen collection / handling, submission of specimen other than nasopharyngeal swab, presence of viral mutation(s) within the areas targeted by this assay, and inadequate number of viral copies (<250 copies / mL). A negative result must be combined with clinical observations, patient history, and epidemiological information.  Fact Sheet for Patients:   StrictlyIdeas.no  Fact Sheet for Healthcare Providers: BankingDealers.co.za  This test is not yet approved or  cleared by the [place] FDA and has been authorized for detection and/or diagnosis of SARS-CoV-2 by FDA under an  Emergency Use Authorization (EUA).  This EUA will remain in effect (meaning this test can be used) for the duration of the COVID-19 declaration under Section 564(b)(1) of the Act, 21 U.S.C. section 360bbb-3(b)(1), unless the authorization is terminated or revoked sooner.  Performed at [facility], [address] [place], [place] [address]     Coagulation Studies: No results for input(s): LABPROT, INR in the last 72 hours.  Urinalysis: No results for input(s): COLORURINE, LABSPEC, PHURINE, GLUCOSEU, HGBUR, BILIRUBINUR, KETONESUR, PROTEINUR, UROBILINOGEN, NITRITE, LEUKOCYTESUR in the last 72 hours.  Invalid input(s): APPERANCEUR    Imaging: No results found.   Medications:   . sodium chloride Stopped (06/01/20 1501)   . aspirin  81 mg Oral Daily  . benazepril  20 mg Oral Daily  . furosemide  60 mg Intravenous Q8H  . loratadine  10 mg Oral Daily  . multivitamin with minerals   Oral Daily  . nystatin   Topical TID  . predniSONE  80 mg Oral Q breakfast  . senna-docusate  1 tablet Oral QHS  . simvastatin  40 mg Oral Daily   sodium chloride, acetaminophen **OR** acetaminophen, magnesium hydroxide, ondansetron **OR** ondansetron (ZOFRAN) IV, traZODone  Assessment/ Plan:  49 y.o. 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, tip lesion, hypertension, hyperlipidemia    admitted on 05/30/2020 for Anasarca [R60.1] Abdominal pain [R10.9]  #AKI on CKD stage IIIb #Underlying FSGS with glomerular tip lesion #Anasarca  AKI most likely secondary to ATN, may be related to volume changes FSGS is currently being treated with high-dose steroids Patient is tolerating well Continue IV furosemide for aggressive diuresis as patient is tolerating well Avoid hypotension     LOS: [place] 9/27/202111:08 AM  [facility] [place], [place]  Note: This note was prepared with Dragon dictation. Any transcription errors are unintentional

## 2020-06-04 LAB — BASIC METABOLIC PANEL
Anion gap: 10 (ref 5–15)
BUN: 52 mg/dL — ABNORMAL HIGH (ref 6–20)
CO2: 31 mmol/L (ref 22–32)
Calcium: 8.1 mg/dL — ABNORMAL LOW (ref 8.9–10.3)
Chloride: 101 mmol/L (ref 98–111)
Creatinine, Ser: 1.39 mg/dL — ABNORMAL HIGH (ref 0.61–1.24)
GFR calc Af Amer: >60 mL/min (ref >60)
GFR calc non Af Amer: 59 mL/min — ABNORMAL LOW (ref >60)
Glucose, Bld: 122 mg/dL — ABNORMAL HIGH (ref 70–99)
Potassium: 4.2 mmol/L (ref 3.5–5.1)
Sodium: 142 mmol/L (ref 135–145)

## 2020-06-04 MED ORDER — SENNOSIDES-DOCUSATE SODIUM 8.6-50 MG PO TABS: 1.0000 | ORAL_TABLET | Freq: Two times a day (BID) | ORAL | 0 refills | Status: AC | PRN

## 2020-06-04 MED ORDER — PREDNISONE 20 MG PO TABS: 80.0000 mg | ORAL_TABLET | Freq: Every day | ORAL | 1 refills | Status: AC

## 2020-06-04 MED ORDER — TORSEMIDE 20 MG PO TABS
40.0000 mg | ORAL_TABLET | Freq: Two times a day (BID) | ORAL | Status: DC
  Filled 2020-06-04 (×2): qty 2

## 2020-06-04 MED ORDER — TORSEMIDE 20 MG PO TABS: 40.0000 mg | ORAL_TABLET | Freq: Two times a day (BID) | ORAL | 0 refills | Status: DC

## 2020-06-04 MED ORDER — BENAZEPRIL HCL 20 MG PO TABS: 20.0000 mg | ORAL_TABLET | Freq: Every day | ORAL | 0 refills | Status: DC

## 2020-06-04 MED ORDER — NYSTATIN 100000 UNIT/GM EX POWD: Freq: Three times a day (TID) | CUTANEOUS | 0 refills | Status: DC

## 2020-06-04 NOTE — Discharge Summary (Signed)
Physician Discharge Summary  Russell Grant:[id number] DOB: [birth year] DOA: 05/30/2020  PCP: Russell Grant date: 05/30/2020 Discharge date: 06/04/2020  Admitted From: Home Disposition: Home  Recommendations for Outpatient Follow-up:  1. Follow up with PCP in 1-2 weeks 2. Follow-up with nephrology as directed  Home Health: No Equipment/Devices: None Discharge Condition: Stable CODE STATUS: Full Diet recommendation: Low-sodium Brief/Interim Summary: 49 year old male with recent diagnosis of FSGS with tip lesions. He was seen in the nephrology office by Dr. Juleen China.  Rule out any severe disease type I disorder low-grade which resolved without treatment leukocytosis improved without antibiotic symptoms on high-dose steroids and torsemide at that time. Has not responded to outpatient torsemide and presented to [facility] on 05/30/2020.  Seen by nephrology consultant. Currently recommending high-dose Lasix 60 mg IV every 8 hours in addition to albumin boluses 25 g 3 times daily. Monitor urine output carefully. Daily weights. Fluid restrict, salt restrict.  Patient diuresing well.  2 and half liters net negative over admission.  Abdominal ultrasound performed and demonstrate small volume ascites however patient does not wish for paracentesis.  Also refusing Lovenox.  SCDs applied.  Continues to diurese well. 14.3 L net negative since admission.  Case discussed with nephrology.  Stable for discharge home at this time.  Recommending continuing prednisone 80 mg daily on discharge.  Recommending torsemide 40 mg twice daily.  Discharge instructions explained at length with patient at bedside.  Wife also present.  All questions answered.  Patient discharged in stable condition.  Will follow with PCP and nephrology post discharge.  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 Topical nystatin, prescribed on discharge  Thrombocytopenia Unclear etiology Possibly related to acute illness Continue to monitor with daily CBCs Platelets stable and no bleeding noted  Leukocytosis Neutrophilic predominance Suspect secondary to high-dose steroids No clear evidence of infection  Morbid obesity BMI 45 This complicates overall care   Discharge Instructions  Discharge Instructions    Diet - low sodium heart healthy   Complete by: As directed    Increase activity slowly   Complete by: As directed      Allergies as of 06/04/2020      Reactions   Cefdinir Rash      Medication List    TAKE these medications   aspirin 81 MG chewable tablet Chew 1 tablet (81 mg total) by mouth daily.   benazepril 20 MG tablet Commonly known as: LOTENSIN Take 1 tablet (20 mg total) by mouth daily. What changed:   medication strength  how much to take   Calcium 600-200 MG-UNIT tablet Take 1 tablet by mouth daily. Can take any over-the-counter supplement.   Claritin 10 MG tablet Generic drug: loratadine Take 10 mg by mouth daily.   Fish Oil Extra Strength 1200 MG Caps Take 1 capsule by mouth at bedtime.   MULTIVITAMIN ADULTS PO Take 1 tablet by mouth daily.   nystatin  powder Commonly known as: MYCOSTATIN/NYSTOP Apply topically 3 (three) times daily.   predniSONE 20 MG tablet Commonly known as: DELTASONE Take 4 tablets  (80 mg total) by mouth daily with breakfast.   senna-docusate 8.6-50 MG tablet Commonly known as: Senokot-S Take 1 tablet by mouth 2 (two) times daily as needed for mild constipation.   simvastatin 40 MG tablet Commonly known as: ZOCOR Take 1 tablet (40 mg total) by mouth daily.   torsemide 20 MG tablet Commonly known as: Demadex Take 2 tablets (40 mg total) by mouth 2 (two) times daily. Fluid pill. What changed:   how much to take  when to take this       Allergies  Allergen Reactions  . Cefdinir Rash    Consultations:  Nephrology   Procedures/Studies: DG Chest 2 View  Result Date: 05/06/2020 CLINICAL DATA:  Peripheral edema EXAM: CHEST - 2 VIEW COMPARISON:  04/07/2020 FINDINGS: Lung volumes are small, but are symmetric and are clear. No pneumothorax or pleural effusion. Cardiac size is within normal limits. Pulmonary vascularity is normal. No acute bone abnormality. IMPRESSION: No active cardiopulmonary disease. Electronically Signed   By: Fidela Salisbury MD   On: 05/06/2020 20:43   US Abdomen Complete  Result Date: 05/30/2020 CLINICAL DATA:  Diffuse abdominal pain EXAM: ABDOMEN ULTRASOUND COMPLETE COMPARISON:  Renal sonogram from May 07, 2020 FINDINGS: Gallbladder: No gallstones or wall thickening visualized. No sonographic Murphy sign noted by sonographer. Common bile duct: Diameter: 4 mm Liver: Echogenicity mildly increased in hepatic parenchyma. Assessment of hepatic parenchyma limited by increased echogenicity and patient body habitus. Portal vein is patent on color Doppler imaging with normal direction of blood flow towards the liver. IVC: No abnormality visualized. Pancreas: Not well visualized. No gross abnormality in the visualized portion. Spleen: Size and appearance within normal limits. Right Kidney: Length: 11.7 cm. No hydronephrosis with a 4.4 x 4.1 x 3.9 cm cyst in the upper pole. Left Kidney: Length: 11.2 cm. No hydronephrosis, visible lesion or  nephrolithiasis on limited assessment. Abdominal aorta: Distal portion of the aorta obscured by overlying bowel gas. Maximum AP diameter 2.5 cm. Other findings: RIGHT pleural effusion and small to moderate ascites. IMPRESSION: 1. Mild increased hepatic echogenicity suggest background hepatic steatosis. 2. No hydronephrosis. 3. RIGHT pleural effusion and small to moderate volume of ascites in keeping with the patient's history of anasarca. 4. 2.5 cm mild dilation of the upper abdominal aorta. Distal aorta not well visualized. Electronically Signed   By: Zetta Bills M.D.   On: 05/30/2020 14:16   US Renal  Result Date: 05/07/2020 CLINICAL DATA:  ARF.  Worsening edema. EXAM: RENAL / URINARY TRACT ULTRASOUND COMPLETE COMPARISON:  No prior FINDINGS: Right Kidney: Renal measurements: 12.1 x 6.6 x 6.7 cm = volume: 279.5 mL. Mild increased echogenicity cannot be excluded. 4.8 cm simple cyst. No hydronephrosis visualized. Left Kidney: Renal measurements: 10.7 x 6.9 x 6.2 cm = volume: 241.0 mL. Mild increase echogenicity cannot be excluded. No mass or hydronephrosis visualized. Bladder: Appears normal for degree of bladder distention. Other: None. IMPRESSION: 1. Mild bilateral renal increased echogenicity cannot be excluded. No acute renal abnormality. No hydronephrosis. No bladder distention. 2. 4.8 cm simple right renal cyst. Electronically Signed   By: Marcello Moores  Register   On: 05/07/2020 09:22   US Venous Img Lower Bilateral (DVT)  Result Date: 05/10/2020 CLINICAL DATA:  49 year old with anasarca. Pain and swelling in the lower extremities. EXAM: BILATERAL LOWER EXTREMITY VENOUS DOPPLER ULTRASOUND TECHNIQUE: Gray-scale sonography with graded  compression, as well as color Doppler and duplex ultrasound were performed to evaluate the lower extremity deep venous systems from the level of the common femoral vein and including the common femoral, femoral, profunda femoral, popliteal and calf veins including the posterior  tibial, peroneal and gastrocnemius veins when visible. The superficial great saphenous vein was also interrogated. Spectral Doppler was utilized to evaluate flow at rest and with distal augmentation maneuvers in the common femoral, femoral and popliteal veins. COMPARISON:  None. FINDINGS: RIGHT LOWER EXTREMITY Common Femoral Vein: No evidence of thrombus. Normal compressibility, respiratory phasicity and response to augmentation. Saphenofemoral Junction: No evidence of thrombus. Normal compressibility and flow on color Doppler imaging. Profunda Femoral Vein: No evidence of thrombus. Normal compressibility and flow on color Doppler imaging. Femoral Vein: No evidence of thrombus. Normal compressibility, respiratory phasicity and response to augmentation. Popliteal Vein: No evidence of thrombus. Normal compressibility, respiratory phasicity and response to augmentation. Calf Veins: No evidence of thrombus. Normal compressibility and flow on color Doppler imaging. Superficial Great Saphenous Vein: No evidence of thrombus. Normal compressibility. Venous Reflux:  None. Other Findings:  None. LEFT LOWER EXTREMITY Common Femoral Vein: No evidence of thrombus. Normal compressibility, respiratory phasicity and response to augmentation. Saphenofemoral Junction: No evidence of thrombus. Normal compressibility and flow on color Doppler imaging. Profunda Femoral Vein: No evidence of thrombus. Normal compressibility and flow on color Doppler imaging. Femoral Vein: No evidence of thrombus. Normal compressibility, respiratory phasicity and response to augmentation. Popliteal Vein: No evidence of thrombus. Normal compressibility, respiratory phasicity and response to augmentation. Calf Veins: No evidence of thrombus. Normal compressibility and flow on color Doppler imaging. Superficial Great Saphenous Vein: Not well identified. Venous Reflux:  None. Other Findings:  None. IMPRESSION: No evidence of deep venous thrombosis in either  lower extremity. Electronically Signed   By: Markus Daft M.D.   On: 05/10/2020 16:35   US Venous Img Upper Uni Right(DVT)  Result Date: 05/10/2020 CLINICAL DATA:  49 year old with pain and swelling right upper extremity. Anasarca. EXAM: RIGHT UPPER EXTREMITY VENOUS DOPPLER ULTRASOUND TECHNIQUE: Gray-scale sonography with graded compression, as well as color Doppler and duplex ultrasound were performed to evaluate the upper extremity deep venous system from the level of the subclavian vein and including the jugular, axillary, basilic, radial, ulnar and upper cephalic vein. Spectral Doppler was utilized to evaluate flow at rest and with distal augmentation maneuvers. COMPARISON:  None. FINDINGS: Contralateral Subclavian Vein: No evidence of thrombus. Normal color Doppler flow and phasicity. Internal Jugular Vein: No evidence of thrombus. Normal compressibility, color Doppler flow and phasicity. Subclavian Vein: No evidence of thrombus. Normal color Doppler flow and phasicity. Normal compressibility. Axillary Vein: No evidence of thrombus. Normal compressibility, color flow and augmentation. Cephalic Vein: Positive for short segment thrombus in the cephalic vein near the antecubital fossa and associated with the peripheral IV site. This thrombus appears to be nonocclusive on the color Doppler images. Basilic Vein: No evidence of thrombus. Normal compressibility, color Doppler flow and augmentation. Brachial Veins: No evidence of thrombus. Normal compressibility, color Doppler flow and augmentation. Radial Veins: No evidence of thrombus. Normal compressibility and color Doppler flow. Ulnar Veins: No evidence of thrombus. Normal compressibility and color Doppler flow. Other Findings:  Subcutaneous edema. IMPRESSION: 1. Negative for deep venous thrombosis in the right upper extremity. 2. Positive for superficial venous thrombus in a short segment of the right cephalic vein associated with the peripheral IV site.  Electronically Signed   By: Markus Daft M.D.   On:  05/10/2020 16:45   CT BIOPSY  Result Date: 05/08/2020 INDICATION: 49 year old male with acute kidney injury concerning for possible glomerulonephritis. He presents for random renal biopsy. Due to poor visualization on ultrasound, we will proceed with CT-guided biopsy. EXAM: CT-guided random renal biopsy MEDICATIONS: None. ANESTHESIA/SEDATION: Moderate (conscious) sedation was employed during this procedure. A total of Versed 2 mg and Fentanyl 100 mcg was administered intravenously. Moderate Sedation Time: 26 minutes. The patient's level of consciousness and vital signs were monitored continuously by radiology nursing throughout the procedure under my direct supervision. FLUOROSCOPY TIME:  None COMPLICATIONS: None immediate. PROCEDURE: Informed written consent was obtained from the patient after a thorough discussion of the procedural risks, benefits and alternatives. All questions were addressed. A timeout was performed prior to the initiation of the procedure. The patient was placed prone on the CT gantry. A suitable skin entry site was selected and marked. The skin was sterilely prepped and draped in the standard fashion using chlorhexidine skin prep. Local anesthesia was attained by infiltration with 1% lidocaine. A small dermatotomy was made. Under intermittent CT guidance, a 15 gauge introducer needle was advanced and positioned at the margin of the lower pole cortex of the right kidney. Several a 16 gauge core biopsies were then coaxially obtained using the Bard automated biopsy device. Biopsy specimens were placed in saline and delivered to pathology for further analysis. As the introducer needle was removed, the biopsy tract was embolized with a Gel-Foam slurry. Post biopsy CT imaging demonstrates no evidence of immediate complication. IMPRESSION: Technically successful CT-guided random core biopsy of the lower pole cortex of the left kidney.  Electronically Signed   By: Jacqulynn Cadet M.D.   On: 05/08/2020 12:22   ECHOCARDIOGRAM COMPLETE  Result Date: 05/09/2020    ECHOCARDIOGRAM REPORT   Patient Name:   Russell Grant Date of Exam: 05/08/2020 Medical Rec #:  [id number]       Height:       70.0 in Accession #:    [id number]      Weight:       306.0 lb Date of Birth:  09/26/1970      BSA:          2.501 m Patient Age:    67 years        BP:           136/79 mmHg Patient Gender: M               HR:           86 bpm. Exam Location:  [facility] Procedure: 2D Echo, Cardiac Doppler, Color Doppler and Intracardiac            Opacification Agent Indications:     I50.9 Congestive heart failure  History:         Patient has no prior history of Echocardiogram examinations.                  Risk Factors:Obesity, Hypertension and Dyslipidemia. Chronic                  kidney disease.  Sonographer:     Wilford Sports Rodgers-Jones Referring Phys:  [id number] [place] Diagnosing Phys: Kate Sable MD IMPRESSIONS  1. Left ventricular ejection fraction, by estimation, is 65 to 70%. The left ventricle has normal function. The left ventricle has no regional wall motion abnormalities. There is mild left ventricular hypertrophy. Left ventricular diastolic parameters were normal.  2. Right ventricular systolic function is normal.  The right ventricular size is normal.  3. The mitral valve is normal in structure. No evidence of mitral valve regurgitation. No evidence of mitral stenosis.  4. The aortic valve is normal in structure. Aortic valve regurgitation is not visualized. No aortic stenosis is present.  5. The inferior vena cava is normal in size with greater than 50% respiratory variability, suggesting right atrial pressure of 3 mmHg. FINDINGS  Left Ventricle: Left ventricular ejection fraction, by estimation, is 65 to 70%. The left ventricle has normal function. The left ventricle has no regional wall motion abnormalities. Definity contrast agent was given IV to delineate  the left ventricular  endocardial borders. The left ventricular internal cavity size was normal in size. There is mild left ventricular hypertrophy. Left ventricular diastolic parameters were normal. Right Ventricle: The right ventricular size is normal. No increase in right ventricular wall thickness. Right ventricular systolic function is normal. Left Atrium: Left atrial size was normal in size. Right Atrium: Right atrial size was normal in size. Pericardium: There is no evidence of pericardial effusion. Mitral Valve: The mitral valve is normal in structure. Normal mobility of the mitral valve leaflets. No evidence of mitral valve regurgitation. No evidence of mitral valve stenosis. Tricuspid Valve: The tricuspid valve is normal in structure. Tricuspid valve regurgitation is not demonstrated. No evidence of tricuspid stenosis. Aortic Valve: The aortic valve is normal in structure. Aortic valve regurgitation is not visualized. No aortic stenosis is present. Pulmonic Valve: The pulmonic valve was not well visualized. Pulmonic valve regurgitation is not visualized. No evidence of pulmonic stenosis. Aorta: The aortic root is normal in size and structure. Venous: The inferior vena cava is normal in size with greater than 50% respiratory variability, suggesting right atrial pressure of 3 mmHg. IAS/Shunts: No atrial level shunt detected by color flow Doppler.  LEFT VENTRICLE PLAX 2D LVIDd:         4.51 cm  Diastology LVIDs:         2.46 cm  LV e' lateral:   12.10 cm/s LV PW:         1.19 cm  LV E/e' lateral: 6.4 LV IVS:        1.18 cm  LV e' medial:    8.59 cm/s LVOT diam:     1.90 cm  LV E/e' medial:  9.0 LV SV:         45 LV SV Index:   18 LVOT Area:     2.84 cm  RIGHT VENTRICLE             IVC RV Basal diam:  3.72 cm     IVC diam: 1.18 cm RV S prime:     18.60 cm/s TAPSE (M-mode): 2.2 cm LEFT ATRIUM             Index       RIGHT ATRIUM           Index LA diam:        4.10 cm 1.64 cm/m  RA Area:     11.10 cm LA Vol  (A2C):   56.3 ml 22.51 ml/m RA Volume:   25.90 ml  10.36 ml/m LA Vol (A4C):   41.6 ml 16.63 ml/m LA Biplane Vol: 51.8 ml 20.71 ml/m  AORTIC VALVE LVOT Vmax:   99.90 cm/s LVOT Vmean:  84.900 cm/s LVOT VTI:    0.160 m  AORTA Ao Root diam: 3.30 cm MITRAL VALVE MV Area (PHT): 3.85 cm    SHUNTS MV Decel  Time: 197 msec    Systemic VTI:  0.16 m MV E velocity: 77.10 cm/s  Systemic Diam: 1.90 cm MV A velocity: 83.90 cm/s MV E/A ratio:  0.92 Kate Sable MD Electronically signed by Kate Sable MD Signature Date/Time: 05/09/2020/11:27:52 AM    Final     (Echo, Carotid, EGD, Colonoscopy, ERCP)    Subjective: Patient seen and examined on day of discharge.  In good spirits.  No pain complaints.  Stable for discharge home.  Discussed with nephrology.  Discharge Exam: Vitals:   06/03/20 2104 06/04/20 0839  BP: (!) 146/74 (!) 152/70  Pulse: 79 77  Resp: 15 16  Temp: 98 F (36.7 C) 97.6 F (36.4 C)  SpO2: 97% 97%   Vitals:   06/03/20 1312 06/03/20 2104 06/04/20 0651 06/04/20 0839  BP: (!) 154/72 (!) 146/74  (!) 152/70  Pulse: 74 79  77  Resp: 16 15  16   Temp: 98 F (36.7 C) 98 F (36.7 C)  97.6 F (36.4 C)  TempSrc: Oral   Oral  SpO2: 98% 97%  97%  Weight:   129.5 kg   Height:        General: Pt is alert, awake, not in acute distress Cardiovascular: RRR, S1/S2 +, no rubs, no gallops Respiratory: CTA bilaterally, no wheezing, no rhonchi Abdominal: Obese, soft, NT, ND, bowel sounds + Extremities: 1+ pitting edema bilaterally, no cyanosis    The results of significant diagnostics from this hospitalization (including imaging, microbiology, ancillary and laboratory) are listed below for reference.     Microbiology: Recent Results (from the past 240 hour(s))  SARS Coronavirus 2 by RT PCR (hospital order, performed in [facility] hospital lab) Nasopharyngeal Nasopharyngeal Swab     Status: None   Collection Time: 05/30/20  1:10 AM   Specimen: Nasopharyngeal Swab  Result Value  Ref Range Status   SARS Coronavirus 2 NEGATIVE NEGATIVE Final    Comment: (NOTE) SARS-CoV-2 target nucleic acids are NOT DETECTED.  The SARS-CoV-2 RNA is generally detectable in upper and lower respiratory specimens during the acute phase of infection. The lowest concentration of SARS-CoV-2 viral copies this assay can detect is 250 copies / mL. A negative result does not preclude SARS-CoV-2 infection and should not be used as the sole basis for treatment or other patient management decisions.  A negative result may occur with improper specimen collection / handling, submission of specimen other than nasopharyngeal swab, presence of viral mutation(s) within the areas targeted by this assay, and inadequate number of viral copies (<250 copies / mL). A negative result must be combined with clinical observations, patient history, and epidemiological information.  Fact Sheet for Patients:   StrictlyIdeas.no  Fact Sheet for Healthcare Providers: BankingDealers.co.za  This test is not yet approved or  cleared by the [place] FDA and has been authorized for detection and/or diagnosis of SARS-CoV-2 by FDA under an Emergency Use Authorization (EUA).  This EUA will remain in effect (meaning this test can be used) for the duration of the COVID-19 declaration under Section 564(b)(1) of the Act, 21 U.S.C. section 360bbb-3(b)(1), unless the authorization is terminated or revoked sooner.  Performed at [facility], [address] [place], [place] [address]      Labs: BNP (last 3 results) Recent Labs    04/07/20 0116 04/10/20 1850 05/07/20 0759  BNP 78.5 57.1 62.9   Basic Metabolic Panel: Recent Labs  Lab 05/31/20 0844 06/01/20 0517 06/02/20 0319 06/03/20 0522 06/04/20 0515  NA 142 144 145  144 142  K 5.2* 4.7 4.5 4.0 4.2  CL 102 108 108 105 101  CO2 32 30 30 33* 31  GLUCOSE 158* 116* 147* 106* 122*  BUN 60* 69* 62*  56* 52*  CREATININE 1.64* 1.58* 1.46* 1.43* 1.39*  CALCIUM 8.3* 8.0* 8.0* 8.2* 8.1*   Liver Function Tests: Recent Labs  Lab 05/29/20 1933 06/01/20 0517  AST 61* 26  ALT 118* 47*  ALKPHOS 88 58  BILITOT 0.8 0.9  PROT 4.8* 4.3*  ALBUMIN 1.9* 2.5*   No results for input(s): LIPASE, AMYLASE in the last 168 hours. No results for input(s): AMMONIA in the last 168 hours. CBC: Recent Labs  Lab 05/29/20 1933 05/30/20 0300 05/31/20 0844 06/01/20 0517 06/02/20 0319  WBC 21.8* 16.7* 16.7* 13.9* 12.1*  NEUTROABS  --   --  14.9* 12.3* 10.8*  HGB 15.6 13.9 12.9* 11.2* 10.4*  HCT 48.2 40.6 38.2* 33.9* 32.0*  MCV 92.3 90.0 90.5 92.4 91.7  PLT 185 146* 126* 102* 102*   Cardiac Enzymes: No results for input(s): CKTOTAL, CKMB, CKMBINDEX, TROPONINI in the last 168 hours. BNP: Invalid input(s): POCBNP CBG: No results for input(s): GLUCAP in the last 168 hours. D-Dimer No results for input(s): DDIMER in the last 72 hours. Hgb A1c No results for input(s): HGBA1C in the last 72 hours. Lipid Profile No results for input(s): CHOL, HDL, LDLCALC, TRIG, CHOLHDL, LDLDIRECT in the last 72 hours. Thyroid function studies No results for input(s): TSH, T4TOTAL, T3FREE, THYROIDAB in the last 72 hours.  Invalid input(s): FREET3 Anemia work up No results for input(s): VITAMINB12, FOLATE, FERRITIN, TIBC, IRON, RETICCTPCT in the last 72 hours. Urinalysis    Component Value Date/Time   COLORURINE STRAW (A) 05/29/2020 1933   APPEARANCEUR CLEAR (A) 05/29/2020 1933   APPEARANCEUR Turbid (A) 05/02/2019 0811   LABSPEC 1.008 05/29/2020 1933   PHURINE 5.0 05/29/2020 1933   GLUCOSEU NEGATIVE 05/29/2020 1933   HGBUR SMALL (A) 05/29/2020 1933   BILIRUBINUR NEGATIVE 05/29/2020 1933   BILIRUBINUR Negative 03/06/2020 1347   BILIRUBINUR Negative 05/02/2019 [place] 05/29/2020 1933   PROTEINUR >=300 (A) 05/29/2020 1933   UROBILINOGEN 0.2 03/06/2020 1347   NITRITE NEGATIVE 05/29/2020 1933    LEUKOCYTESUR NEGATIVE 05/29/2020 1933   Sepsis Labs Invalid input(s): PROCALCITONIN,  WBC,  LACTICIDVEN Microbiology Recent Results (from the past 240 hour(s))  SARS Coronavirus 2 by RT PCR (hospital order, performed in [place] hospital lab) Nasopharyngeal Nasopharyngeal Swab     Status: None   Collection Time: 05/30/20  1:10 AM   Specimen: Nasopharyngeal Swab  Result Value Ref Range Status   SARS Coronavirus 2 NEGATIVE NEGATIVE Final    Comment: (NOTE) SARS-CoV-2 target nucleic acids are NOT DETECTED.  The SARS-CoV-2 RNA is generally detectable in upper and lower respiratory specimens during the acute phase of infection. The lowest concentration of SARS-CoV-2 viral copies this assay can detect is 250 copies / mL. A negative result does not preclude SARS-CoV-2 infection and should not be used as the sole basis for treatment or other patient management decisions.  A negative result may occur with improper specimen collection / handling, submission of specimen other than nasopharyngeal swab, presence of viral mutation(s) within the areas targeted by this assay, and inadequate number of viral copies (<250 copies / mL). A negative result must be combined with clinical observations, patient history, and epidemiological information.  Fact Sheet for Patients:   StrictlyIdeas.no  Fact Sheet for Healthcare Providers: BankingDealers.co.za  This test is  not yet approved or  cleared by the [place] and has been authorized for detection and/or diagnosis of SARS-CoV-2 by FDA under an Emergency Use Authorization (EUA).  This EUA will remain in effect (meaning this test can be used) for the duration of the COVID-19 declaration under Section 564(b)(1) of the Act, 21 U.S.C. section 360bbb-3(b)(1), unless the authorization is terminated or revoked sooner.  Performed at [facility], [address], [place], [place]  [address]      Time coordinating discharge: Over 30 minutes  SIGNED:   Sidney Ace, MD  [facility] 06/04/2020, 1:23 PM Pager   If 7PM-7AM, please contact night-coverage

## 2020-06-04 NOTE — Progress Notes (Signed)
[place], [place] 06/04/20  Subjective:   Hospital day # 5  Doing fair.  Feels like his lower extremity edema is resolving.  Urine output is good.  Appetite is also good.  Able to eat without any nausea or vomiting Ambulatory in hallways  Renal: 09/27 0701 - 09/28 0700 In: 900 [P.O.:900] Out: 4075 [Urine:4075] Lab Results  Component Value Date   CREATININE 1.39 (H) 06/04/2020   CREATININE 1.43 (H) 06/03/2020   CREATININE 1.46 (H) 06/02/2020     Objective:  Vital signs in last 24 hours:  Temp:  [97.6 F (36.4 C)-98 F (36.7 C)] 97.6 F (36.4 C) (09/28 0839) Pulse Rate:  [74-79] 77 (09/28 0839) Resp:  [15-16] 16 (09/28 0839) BP: (146-154)/(70-74) 152/70 (09/28 0839) SpO2:  [97 %-98 %] 97 % (09/28 0839) Weight:  [129.5 kg] 129.5 kg (09/28 0651)  Weight change:  Filed Weights   05/29/20 1930 05/31/20 0600 06/04/20 0651  Weight: (!) 146.5 kg (!) 142 kg 129.5 kg    Intake/Output:    Intake/Output Summary (Last 24 hours) at 06/04/2020 0924 Last data filed at 06/04/2020 0653 Gross per 24 hour  Intake 900 ml  Output 4075 ml  Net -3175 ml    Physical Exam: General:  No acute distress, sitting in the bed  HEENT  anicteric, moist oral mucous membrane  Pulm/lungs  normal breathing effort, lungs are clear to auscultation  CVS/Heart  regular rhythm, no rub or gallop  Abdomen:   Soft, nontender  Extremities:  ++ peripheral edema, compression socks  Neurologic:  Alert, oriented, able to follow commands  Skin:  No acute rashes     Basic Metabolic Panel:  Recent Labs  Lab 05/31/20 0844 05/31/20 0844 06/01/20 0517 06/01/20 0517 06/02/20 0319 06/03/20 0522 06/04/20 0515  NA 142  --  144  --  145 144 142  K 5.2*  --  4.7  --  4.5 4.0 4.2  CL 102  --  108  --  108 105 101  CO2 32  --  30  --  30 33* 31  GLUCOSE 158*  --  116*  --  147* 106* 122*  BUN 60*  --  69*  --  62* 56* 52*  CREATININE 1.64*  --  1.58*  --  1.46* 1.43* 1.39*   CALCIUM 8.3*   < > 8.0*   < > 8.0* 8.2* 8.1*   < > = values in this interval not displayed.     CBC: Recent Labs  Lab 05/29/20 1933 05/30/20 0300 05/31/20 0844 06/01/20 0517 06/02/20 0319  WBC 21.8* 16.7* 16.7* 13.9* 12.1*  NEUTROABS  --   --  14.9* 12.3* 10.8*  HGB 15.6 13.9 12.9* 11.2* 10.4*  HCT 48.2 40.6 38.2* 33.9* 32.0*  MCV 92.3 90.0 90.5 92.4 91.7  PLT 185 146* 126* 102* 102*      Lab Results  Component Value Date   HEPBSAG NON REACTIVE 05/07/2020   HEPBSAB Reactive (A) 05/07/2020   HEPBIGM NON REACTIVE 05/07/2020      Microbiology:  Recent Results (from the past 240 hour(s))  SARS Coronavirus 2 by RT PCR (hospital order, performed in [facility] hospital lab) Nasopharyngeal Nasopharyngeal Swab     Status: None   Collection Time: 05/30/20  1:10 AM   Specimen: Nasopharyngeal Swab  Result Value Ref Range Status   SARS Coronavirus 2 NEGATIVE NEGATIVE Final    Comment: (NOTE) SARS-CoV-2 target nucleic acids are NOT DETECTED.  The SARS-CoV-2 RNA  is generally detectable in upper and lower respiratory specimens during the acute phase of infection. The lowest concentration of SARS-CoV-2 viral copies this assay can detect is 250 copies / mL. A negative result does not preclude SARS-CoV-2 infection and should not be used as the sole basis for treatment or other patient management decisions.  A negative result may occur with improper specimen collection / handling, submission of specimen other than nasopharyngeal swab, presence of viral mutation(s) within the areas targeted by this assay, and inadequate number of viral copies (<250 copies / mL). A negative result must be combined with clinical observations, patient history, and epidemiological information.  Fact Sheet for Patients:   StrictlyIdeas.no  Fact Sheet for Healthcare Providers: BankingDealers.co.za  This test is not yet approved or  cleared by the  [place] FDA and has been authorized for detection and/or diagnosis of SARS-CoV-2 by FDA under an Emergency Use Authorization (EUA).  This EUA will remain in effect (meaning this test can be used) for the duration of the COVID-19 declaration under Section 564(b)(1) of the Act, 21 U.S.C. section 360bbb-3(b)(1), unless the authorization is terminated or revoked sooner.  Performed at [facility], [address] [place], [place] [address]     Coagulation Studies: No results for input(s): LABPROT, INR in the last 72 hours.  Urinalysis: No results for input(s): COLORURINE, LABSPEC, PHURINE, GLUCOSEU, HGBUR, BILIRUBINUR, KETONESUR, PROTEINUR, UROBILINOGEN, NITRITE, LEUKOCYTESUR in the last 72 hours.  Invalid input(s): APPERANCEUR    Imaging: No results found.   Medications:   . sodium chloride Stopped (06/01/20 1501)   . aspirin  81 mg Oral Daily  . benazepril  20 mg Oral Daily  . furosemide  60 mg Intravenous Q8H  . loratadine  10 mg Oral Daily  . multivitamin with minerals   Oral Daily  . nystatin   Topical TID  . predniSONE  80 mg Oral Q breakfast  . senna-docusate  1 tablet Oral QHS  . simvastatin  40 mg Oral Daily   sodium chloride, acetaminophen **OR** acetaminophen, magnesium hydroxide, ondansetron **OR** ondansetron (ZOFRAN) IV, traZODone  Assessment/ Plan:  49 y.o. 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 Any transcription errors are unintentional

## 2020-06-04 NOTE — Progress Notes (Signed)
Russell Grant A and O x4. VSS. Pt tolerating diet well. No complaints of nausea or vomiting. IV removed intact, prescriptions given. Pt voices understanding of discharge instructions with no further questions. Patient discharged, walked himself out with wife.  Allergies as of 06/04/2020      Reactions   Cefdinir Rash      Medication List    TAKE these medications   aspirin 81 MG chewable tablet Chew 1 tablet (81 mg total) by mouth daily.   benazepril 20 MG tablet Commonly known as: LOTENSIN Take 1 tablet (20 mg total) by mouth daily. What changed:   medication strength  how much to take   Calcium 600-200 MG-UNIT tablet Take 1 tablet by mouth daily. Can take any over-the-counter supplement.   Claritin 10 MG tablet Generic drug: loratadine Take 10 mg by mouth daily.   Fish Oil Extra Strength 1200 MG Caps Take 1 capsule by mouth at bedtime.   MULTIVITAMIN ADULTS PO Take 1 tablet by mouth daily.   nystatin powder Commonly known as: MYCOSTATIN/NYSTOP Apply topically 3 (three) times daily.   predniSONE 20 MG tablet Commonly known as: DELTASONE Take 4 tablets (80 mg total) by mouth daily with breakfast.   senna-docusate 8.6-50 MG tablet Commonly known as: Senokot-S Take 1 tablet by mouth 2 (two) times daily as needed for mild constipation.   simvastatin 40 MG tablet Commonly known as: ZOCOR Take 1 tablet (40 mg total) by mouth daily.   torsemide 20 MG tablet Commonly known as: Demadex Take 2 tablets (40 mg total) by mouth 2 (two) times daily. Fluid pill. What changed:   how much to take  when to take this       Vitals:   06/03/20 2104 06/04/20 0839  BP: (!) 146/74 (!) 152/70  Pulse: 79 77  Resp: 15 16  Temp: 98 F (36.7 C) 97.6 F (36.4 C)  SpO2: 97% 97%    Russell Grant

## 2020-08-04 ENCOUNTER — Inpatient Hospital Stay
Admission: EM | Admit: 2020-08-04 | Discharge: 2020-08-11 | DRG: 918 | Disposition: A | Discharge status: Home / Self Care | Payer: BC Managed Care – PPO | Attending: Internal Medicine | Admitting: Internal Medicine

## 2020-08-04 ENCOUNTER — Emergency Department: Payer: BC Managed Care – PPO

## 2020-08-04 ENCOUNTER — Other Ambulatory Visit: Payer: Self-pay

## 2020-08-04 ENCOUNTER — Encounter: Payer: Self-pay | Admitting: Internal Medicine

## 2020-08-04 DIAGNOSIS — D849 Immunodeficiency, unspecified: Secondary | ICD-10-CM | POA: Diagnosis present

## 2020-08-04 DIAGNOSIS — T380X5A Adverse effect of glucocorticoids and synthetic analogues, initial encounter: Secondary | ICD-10-CM | POA: Diagnosis present

## 2020-08-04 DIAGNOSIS — N179 Acute kidney failure, unspecified: Secondary | ICD-10-CM | POA: Diagnosis present

## 2020-08-04 DIAGNOSIS — R2242 Localized swelling, mass and lump, left lower limb: Secondary | ICD-10-CM | POA: Diagnosis not present

## 2020-08-04 DIAGNOSIS — Z79899 Other long term (current) drug therapy: Secondary | ICD-10-CM | POA: Diagnosis not present

## 2020-08-04 DIAGNOSIS — R42 Dizziness and giddiness: Secondary | ICD-10-CM

## 2020-08-04 DIAGNOSIS — E785 Hyperlipidemia, unspecified: Secondary | ICD-10-CM | POA: Diagnosis present

## 2020-08-04 DIAGNOSIS — Y92239 Unspecified place in hospital as the place of occurrence of the external cause: Secondary | ICD-10-CM | POA: Diagnosis present

## 2020-08-04 DIAGNOSIS — I10 Essential (primary) hypertension: Secondary | ICD-10-CM | POA: Diagnosis not present

## 2020-08-04 DIAGNOSIS — R296 Repeated falls: Secondary | ICD-10-CM | POA: Diagnosis present

## 2020-08-04 DIAGNOSIS — B027 Disseminated zoster: Secondary | ICD-10-CM | POA: Diagnosis present

## 2020-08-04 DIAGNOSIS — N183 Chronic kidney disease, stage 3 unspecified: Secondary | ICD-10-CM | POA: Diagnosis present

## 2020-08-04 DIAGNOSIS — D72829 Elevated white blood cell count, unspecified: Secondary | ICD-10-CM

## 2020-08-04 DIAGNOSIS — I129 Hypertensive chronic kidney disease with stage 1 through stage 4 chronic kidney disease, or unspecified chronic kidney disease: Secondary | ICD-10-CM | POA: Diagnosis not present

## 2020-08-04 DIAGNOSIS — Y929 Unspecified place or not applicable: Secondary | ICD-10-CM | POA: Diagnosis not present

## 2020-08-04 DIAGNOSIS — R55 Syncope and collapse: Secondary | ICD-10-CM | POA: Diagnosis not present

## 2020-08-04 DIAGNOSIS — A419 Sepsis, unspecified organism: Secondary | ICD-10-CM | POA: Diagnosis present

## 2020-08-04 DIAGNOSIS — Z6835 Body mass index (BMI) 35.0-35.9, adult: Secondary | ICD-10-CM

## 2020-08-04 DIAGNOSIS — Z85828 Personal history of other malignant neoplasm of skin: Secondary | ICD-10-CM

## 2020-08-04 DIAGNOSIS — T502X1A Poisoning by carbonic-anhydrase inhibitors, benzothiadiazides and other diuretics, accidental (unintentional), initial encounter: Secondary | ICD-10-CM | POA: Diagnosis present

## 2020-08-04 DIAGNOSIS — D696 Thrombocytopenia, unspecified: Secondary | ICD-10-CM | POA: Diagnosis not present

## 2020-08-04 DIAGNOSIS — E871 Hypo-osmolality and hyponatremia: Secondary | ICD-10-CM | POA: Diagnosis present

## 2020-08-04 DIAGNOSIS — B0229 Other postherpetic nervous system involvement: Secondary | ICD-10-CM | POA: Diagnosis present

## 2020-08-04 DIAGNOSIS — Z20822 Contact with and (suspected) exposure to covid-19: Secondary | ICD-10-CM | POA: Diagnosis present

## 2020-08-04 DIAGNOSIS — R6510 Systemic inflammatory response syndrome (SIRS) of non-infectious origin without acute organ dysfunction: Secondary | ICD-10-CM | POA: Diagnosis present

## 2020-08-04 DIAGNOSIS — Z7952 Long term (current) use of systemic steroids: Secondary | ICD-10-CM

## 2020-08-04 DIAGNOSIS — E1122 Type 2 diabetes mellitus with diabetic chronic kidney disease: Secondary | ICD-10-CM | POA: Diagnosis present

## 2020-08-04 DIAGNOSIS — R6511 Systemic inflammatory response syndrome (SIRS) of non-infectious origin with acute organ dysfunction: Secondary | ICD-10-CM | POA: Diagnosis not present

## 2020-08-04 DIAGNOSIS — Z888 Allergy status to other drugs, medicaments and biological substances status: Secondary | ICD-10-CM | POA: Diagnosis not present

## 2020-08-04 DIAGNOSIS — E119 Type 2 diabetes mellitus without complications: Secondary | ICD-10-CM

## 2020-08-04 DIAGNOSIS — R21 Rash and other nonspecific skin eruption: Secondary | ICD-10-CM | POA: Diagnosis not present

## 2020-08-04 DIAGNOSIS — N032 Chronic nephritic syndrome with diffuse membranous glomerulonephritis: Secondary | ICD-10-CM | POA: Diagnosis present

## 2020-08-04 DIAGNOSIS — R531 Weakness: Secondary | ICD-10-CM

## 2020-08-04 DIAGNOSIS — N041 Nephrotic syndrome with focal and segmental glomerular lesions: Secondary | ICD-10-CM | POA: Diagnosis present

## 2020-08-04 DIAGNOSIS — Z981 Arthrodesis status: Secondary | ICD-10-CM | POA: Diagnosis not present

## 2020-08-04 DIAGNOSIS — E1165 Type 2 diabetes mellitus with hyperglycemia: Secondary | ICD-10-CM | POA: Diagnosis present

## 2020-08-04 DIAGNOSIS — Z7982 Long term (current) use of aspirin: Secondary | ICD-10-CM

## 2020-08-04 DIAGNOSIS — D631 Anemia in chronic kidney disease: Secondary | ICD-10-CM | POA: Diagnosis not present

## 2020-08-04 DIAGNOSIS — E874 Mixed disorder of acid-base balance: Secondary | ICD-10-CM | POA: Diagnosis present

## 2020-08-04 DIAGNOSIS — E860 Dehydration: Secondary | ICD-10-CM | POA: Diagnosis present

## 2020-08-04 DIAGNOSIS — Z8249 Family history of ischemic heart disease and other diseases of the circulatory system: Secondary | ICD-10-CM | POA: Diagnosis not present

## 2020-08-04 DIAGNOSIS — E669 Obesity, unspecified: Secondary | ICD-10-CM | POA: Diagnosis present

## 2020-08-04 DIAGNOSIS — N189 Chronic kidney disease, unspecified: Secondary | ICD-10-CM

## 2020-08-04 DIAGNOSIS — R7989 Other specified abnormal findings of blood chemistry: Secondary | ICD-10-CM | POA: Diagnosis present

## 2020-08-04 DIAGNOSIS — E11649 Type 2 diabetes mellitus with hypoglycemia without coma: Secondary | ICD-10-CM | POA: Diagnosis present

## 2020-08-04 DIAGNOSIS — B029 Zoster without complications: Secondary | ICD-10-CM | POA: Diagnosis not present

## 2020-08-04 LAB — URINALYSIS, COMPLETE (UACMP) WITH MICROSCOPIC
Bilirubin Urine: NEGATIVE
Glucose, UA: 150 mg/dL — AB
Ketones, ur: NEGATIVE mg/dL
Leukocytes,Ua: NEGATIVE
Nitrite: NEGATIVE
Protein, ur: 100 mg/dL — AB
Specific Gravity, Urine: 1.015 (ref 1.005–1.030)
pH: 5 (ref 5.0–8.0)

## 2020-08-04 LAB — CBC
HCT: 35.2 % — ABNORMAL LOW (ref 39.0–52.0)
Hemoglobin: 12.1 g/dL — ABNORMAL LOW (ref 13.0–17.0)
MCH: 32.9 pg (ref 26.0–34.0)
MCHC: 34.4 g/dL (ref 30.0–36.0)
MCV: 95.7 fL (ref 80.0–100.0)
Platelets: 191 10*3/uL (ref 150–400)
RBC: 3.68 MIL/uL — ABNORMAL LOW (ref 4.22–5.81)
RDW: 17.6 % — ABNORMAL HIGH (ref 11.5–15.5)
WBC: 23.6 10*3/uL — ABNORMAL HIGH (ref 4.0–10.5)
nRBC: 0.7 % — ABNORMAL HIGH (ref 0.0–0.2)

## 2020-08-04 LAB — LACTIC ACID, PLASMA
Lactic Acid, Venous: 3.1 mmol/L (ref 0.5–1.9)
Lactic Acid, Venous: 3.4 mmol/L (ref 0.5–1.9)
Lactic Acid, Venous: 3.1 mmol/L (ref 0.5–1.9)

## 2020-08-04 LAB — BASIC METABOLIC PANEL
Anion gap: 17 — ABNORMAL HIGH (ref 5–15)
BUN: 107 mg/dL — ABNORMAL HIGH (ref 6–20)
CO2: 29 mmol/L (ref 22–32)
Calcium: 8.7 mg/dL — ABNORMAL LOW (ref 8.9–10.3)
Chloride: 90 mmol/L — ABNORMAL LOW (ref 98–111)
Creatinine, Ser: 1.93 mg/dL — ABNORMAL HIGH (ref 0.61–1.24)
GFR, Estimated: 42 mL/min — ABNORMAL LOW (ref >60)
Glucose, Bld: 357 mg/dL — ABNORMAL HIGH (ref 70–99)
Potassium: 4.5 mmol/L (ref 3.5–5.1)
Sodium: 136 mmol/L (ref 135–145)

## 2020-08-04 LAB — CBC WITH DIFFERENTIAL/PLATELET
Abs Immature Granulocytes: 1.31 10*3/uL — ABNORMAL HIGH (ref 0.00–0.07)
Basophils Absolute: 0 10*3/uL (ref 0.0–0.1)
Basophils Relative: 0 %
Eosinophils Absolute: 0 10*3/uL (ref 0.0–0.5)
Eosinophils Relative: 0 %
HCT: 34 % — ABNORMAL LOW (ref 39.0–52.0)
Hemoglobin: 11.2 g/dL — ABNORMAL LOW (ref 13.0–17.0)
Immature Granulocytes: 6 %
Lymphocytes Relative: 5 %
Lymphs Abs: 1.2 10*3/uL (ref 0.7–4.0)
MCH: 32.3 pg (ref 26.0–34.0)
MCHC: 32.9 g/dL (ref 30.0–36.0)
MCV: 98 fL (ref 80.0–100.0)
Monocytes Absolute: 0.8 10*3/uL (ref 0.1–1.0)
Monocytes Relative: 4 %
Neutro Abs: 18.9 10*3/uL — ABNORMAL HIGH (ref 1.7–7.7)
Neutrophils Relative %: 85 %
Platelets: 159 10*3/uL (ref 150–400)
RBC: 3.47 MIL/uL — ABNORMAL LOW (ref 4.22–5.81)
RDW: 17.8 % — ABNORMAL HIGH (ref 11.5–15.5)
Smear Review: NORMAL
WBC: 22.2 10*3/uL — ABNORMAL HIGH (ref 4.0–10.5)
nRBC: 0.5 % — ABNORMAL HIGH (ref 0.0–0.2)

## 2020-08-04 LAB — PROTEIN / CREATININE RATIO, URINE
Creatinine, Urine: 56 mg/dL
Protein Creatinine Ratio: 2.11 mg/mg{Cre} — ABNORMAL HIGH (ref 0.00–0.15)
Total Protein, Urine: 118 mg/dL

## 2020-08-04 LAB — HEMOGLOBIN A1C
Hgb A1c MFr Bld: 7 % — ABNORMAL HIGH (ref 4.8–5.6)
Mean Plasma Glucose: 154.2 mg/dL

## 2020-08-04 LAB — RESP PANEL BY RT-PCR (FLU A&B, COVID) ARPGX2
Influenza A by PCR: NEGATIVE
Influenza B by PCR: NEGATIVE
SARS Coronavirus 2 by RT PCR: NEGATIVE

## 2020-08-04 LAB — CREATININE, SERUM
Creatinine, Ser: 2.07 mg/dL — ABNORMAL HIGH (ref 0.61–1.24)
GFR, Estimated: 38 mL/min — ABNORMAL LOW (ref >60)

## 2020-08-04 LAB — PROTIME-INR
INR: 0.9 (ref 0.8–1.2)
Prothrombin Time: 12.2 seconds (ref 11.4–15.2)

## 2020-08-04 LAB — HEPATIC FUNCTION PANEL
ALT: 305 U/L — ABNORMAL HIGH (ref 0–44)
AST: 73 U/L — ABNORMAL HIGH (ref 15–41)
Albumin: 2.7 g/dL — ABNORMAL LOW (ref 3.5–5.0)
Alkaline Phosphatase: 134 U/L — ABNORMAL HIGH (ref 38–126)
Bilirubin, Direct: <0.1 mg/dL (ref 0.0–0.2)
Total Bilirubin: 0.6 mg/dL (ref 0.3–1.2)
Total Protein: 6 g/dL — ABNORMAL LOW (ref 6.5–8.1)

## 2020-08-04 LAB — GLUCOSE, CAPILLARY
Glucose-Capillary: 203 mg/dL — ABNORMAL HIGH (ref 70–99)
Glucose-Capillary: 311 mg/dL — ABNORMAL HIGH (ref 70–99)

## 2020-08-04 LAB — CBG MONITORING, ED: Glucose-Capillary: 135 mg/dL — ABNORMAL HIGH (ref 70–99)

## 2020-08-04 LAB — APTT: aPTT: <24 seconds — ABNORMAL LOW (ref 24–36)

## 2020-08-04 LAB — TROPONIN I (HIGH SENSITIVITY): Troponin I (High Sensitivity): 73 ng/L — ABNORMAL HIGH (ref <18)

## 2020-08-04 LAB — PROCALCITONIN: Procalcitonin: 1.15 ng/mL

## 2020-08-04 LAB — CK: Total CK: 14 U/L — ABNORMAL LOW (ref 49–397)

## 2020-08-04 MED ORDER — ONDANSETRON HCL 4 MG PO TABS: 4.0000 mg | ORAL_TABLET | Freq: Four times a day (QID) | ORAL | Status: DC | PRN

## 2020-08-04 MED ORDER — GABAPENTIN 300 MG PO CAPS
300.0000 mg | ORAL_CAPSULE | Freq: Three times a day (TID) | ORAL | Status: DC
  Administered 2020-08-04 – 2020-08-06 (×7): 300 mg via ORAL
  Filled 2020-08-04 (×7): qty 1

## 2020-08-04 MED ORDER — INSULIN ASPART 100 UNIT/ML ~~LOC~~ SOLN
0.0000 [IU] | Freq: Three times a day (TID) | SUBCUTANEOUS | Status: DC
  Administered 2020-08-04: 7 [IU] via SUBCUTANEOUS
  Administered 2020-08-04: 3 [IU] via SUBCUTANEOUS
  Administered 2020-08-05: 4 [IU] via SUBCUTANEOUS
  Administered 2020-08-05: 7 [IU] via SUBCUTANEOUS
  Administered 2020-08-05: 3 [IU] via SUBCUTANEOUS
  Administered 2020-08-06: 11 [IU] via SUBCUTANEOUS
  Administered 2020-08-06: 7 [IU] via SUBCUTANEOUS
  Administered 2020-08-06: 4 [IU] via SUBCUTANEOUS
  Administered 2020-08-07: 3 [IU] via SUBCUTANEOUS
  Administered 2020-08-07: 15 [IU] via SUBCUTANEOUS
  Administered 2020-08-07: 7 [IU] via SUBCUTANEOUS
  Administered 2020-08-08: 3 [IU] via SUBCUTANEOUS
  Administered 2020-08-08 (×2): 11 [IU] via SUBCUTANEOUS
  Administered 2020-08-09: 7 [IU] via SUBCUTANEOUS
  Administered 2020-08-09: 3 [IU] via SUBCUTANEOUS
  Administered 2020-08-09: 4 [IU] via SUBCUTANEOUS
  Administered 2020-08-10 (×2): 7 [IU] via SUBCUTANEOUS
  Administered 2020-08-11: 3 [IU] via SUBCUTANEOUS
  Filled 2020-08-04 (×20): qty 1

## 2020-08-04 MED ORDER — SODIUM CHLORIDE 0.9 % IV SOLN
1.0000 g | Freq: Two times a day (BID) | INTRAVENOUS | Status: DC
  Administered 2020-08-04: 1 g via INTRAVENOUS
  Filled 2020-08-04: qty 1

## 2020-08-04 MED ORDER — ONDANSETRON HCL 4 MG/2ML IJ SOLN: 4.0000 mg | Freq: Four times a day (QID) | INTRAMUSCULAR | Status: DC | PRN

## 2020-08-04 MED ORDER — PREDNISONE 20 MG PO TABS
40.0000 mg | ORAL_TABLET | Freq: Two times a day (BID) | ORAL | Status: DC
  Administered 2020-08-04 – 2020-08-06 (×5): 40 mg via ORAL
  Filled 2020-08-04 (×5): qty 2

## 2020-08-04 MED ORDER — ENOXAPARIN SODIUM 60 MG/0.6ML ~~LOC~~ SOLN
55.0000 mg | SUBCUTANEOUS | Status: DC
  Administered 2020-08-06 – 2020-08-09 (×4): 55 mg via SUBCUTANEOUS
  Filled 2020-08-04 (×8): qty 0.6

## 2020-08-04 MED ORDER — HEPARIN SODIUM (PORCINE) 5000 UNIT/ML IJ SOLN: 5000.0000 [IU] | Freq: Three times a day (TID) | INTRAMUSCULAR | Status: DC

## 2020-08-04 MED ORDER — INSULIN DETEMIR 100 UNIT/ML ~~LOC~~ SOLN
20.0000 [IU] | Freq: Every day | SUBCUTANEOUS | Status: DC
  Administered 2020-08-04 – 2020-08-09 (×6): 20 [IU] via SUBCUTANEOUS
  Filled 2020-08-04 (×7): qty 0.2

## 2020-08-04 MED ORDER — LORATADINE 10 MG PO TABS
10.0000 mg | ORAL_TABLET | Freq: Every day | ORAL | Status: DC
  Administered 2020-08-04 – 2020-08-11 (×8): 10 mg via ORAL
  Filled 2020-08-04 (×8): qty 1

## 2020-08-04 MED ORDER — ACETAMINOPHEN 650 MG RE SUPP: 650.0000 mg | Freq: Four times a day (QID) | RECTAL | Status: DC | PRN

## 2020-08-04 MED ORDER — VANCOMYCIN HCL 500 MG/100ML IV SOLN
500.0000 mg | Freq: Once | INTRAVENOUS | Status: AC
  Administered 2020-08-04: 500 mg via INTRAVENOUS
  Filled 2020-08-04: qty 100

## 2020-08-04 MED ORDER — SODIUM CHLORIDE 0.9 % IV SOLN
INTRAVENOUS | Status: DC | PRN
  Administered 2020-08-04 – 2020-08-05 (×2): 1000 mL via INTRAVENOUS
  Administered 2020-08-06: 250 mL via INTRAVENOUS

## 2020-08-04 MED ORDER — ACETAMINOPHEN 325 MG PO TABS
650.0000 mg | ORAL_TABLET | Freq: Four times a day (QID) | ORAL | Status: DC | PRN
  Administered 2020-08-04 – 2020-08-09 (×6): 650 mg via ORAL
  Filled 2020-08-04 (×7): qty 2

## 2020-08-04 MED ORDER — CALCIUM CARBONATE-VITAMIN D 500-200 MG-UNIT PO TABS
1.0000 | ORAL_TABLET | Freq: Every day | ORAL | Status: DC
  Administered 2020-08-04 – 2020-08-11 (×8): 1 via ORAL
  Filled 2020-08-04 (×9): qty 1

## 2020-08-04 MED ORDER — SODIUM CHLORIDE 0.9 % IV BOLUS
500.0000 mL | Freq: Once | INTRAVENOUS | Status: AC
  Administered 2020-08-04: 500 mL via INTRAVENOUS

## 2020-08-04 MED ORDER — SODIUM CHLORIDE 0.9 % IV SOLN
1.0000 g | Freq: Three times a day (TID) | INTRAVENOUS | Status: DC
  Administered 2020-08-04 – 2020-08-05 (×3): 1 g via INTRAVENOUS
  Filled 2020-08-04 (×4): qty 1

## 2020-08-04 MED ORDER — FISH OIL EXTRA STRENGTH 1200 MG PO CAPS: 1.0000 | ORAL_CAPSULE | Freq: Every day | ORAL | Status: DC

## 2020-08-04 MED ORDER — SIMVASTATIN 40 MG PO TABS
40.0000 mg | ORAL_TABLET | Freq: Every day | ORAL | Status: DC
  Administered 2020-08-04 – 2020-08-11 (×8): 40 mg via ORAL
  Filled 2020-08-04 (×5): qty 1
  Filled 2020-08-04: qty 4
  Filled 2020-08-04 (×2): qty 1

## 2020-08-04 MED ORDER — VANCOMYCIN HCL IN DEXTROSE 1-5 GM/200ML-% IV SOLN: 1000.0000 mg | Freq: Once | INTRAVENOUS | Status: DC

## 2020-08-04 MED ORDER — ASPIRIN 81 MG PO CHEW
81.0000 mg | CHEWABLE_TABLET | Freq: Every day | ORAL | Status: DC
  Administered 2020-08-04 – 2020-08-11 (×8): 81 mg via ORAL
  Filled 2020-08-04 (×8): qty 1

## 2020-08-04 MED ORDER — VANCOMYCIN HCL 1250 MG/250ML IV SOLN
1250.0000 mg | INTRAVENOUS | Status: DC
  Filled 2020-08-04: qty 250

## 2020-08-04 MED ORDER — ADULT MULTIVITAMIN W/MINERALS CH
1.0000 | ORAL_TABLET | Freq: Every day | ORAL | Status: DC
  Administered 2020-08-04 – 2020-08-11 (×8): 1 via ORAL
  Filled 2020-08-04 (×8): qty 1

## 2020-08-04 MED ORDER — VANCOMYCIN HCL 2000 MG/400ML IV SOLN
2000.0000 mg | Freq: Once | INTRAVENOUS | Status: AC
  Administered 2020-08-04: 2000 mg via INTRAVENOUS
  Filled 2020-08-04: qty 400

## 2020-08-04 NOTE — ED Notes (Signed)
Called dietary , they advised meal tray should be up shortly
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
## 2020-08-04 NOTE — Progress Notes (Signed)
Received report from [place], [place] in ER.  Russell Grant

## 2020-08-04 NOTE — ED Notes (Signed)
Pt transported Via ED tech Jenny Reichmann . Pt calm , collective , denied pain or sob

## 2020-08-04 NOTE — Progress Notes (Signed)
CODE SEPSIS - PHARMACY COMMUNICATION  **Broad Spectrum Antibiotics should be administered within 1 hour of Sepsis diagnosis**  Time Code Sepsis Called/Page Received: 1011  Antibiotics Ordered: 1015  Time of 1st antibiotic administration: 1038  Additional action taken by pharmacy: increased dose of meropenem for renal function  If necessary, Name of Provider/Nurse Contacted: [place] ,PharmD Clinical Pharmacist  08/04/2020  11:28 AM

## 2020-08-04 NOTE — ED Triage Notes (Addendum)
Pt states on Monday he started to have dizziness and more weakness. Pt states Wednesday his provider took him off a medication, but he does not remember the name and started to fall. Pt states he did have LOC with a fall, but did not hit his head on Wednesday.   Pt states history of shingles over the last several weeks/months

## 2020-08-04 NOTE — ED Notes (Addendum)
Pt requesting more water.

## 2020-08-04 NOTE — ED Notes (Signed)
Per IV team, unable to obtain PIV after sticks x 2 with ultrasound.  Pt very tense, very afraid of needles, asked IV team for "break and time" after two unsuccessful attempts.  Unable to place IV or obtain lab draw.  Dr. Damita Dunnings aware.

## 2020-08-04 NOTE — ED Notes (Signed)
Continue to await IV team for PIV placement.  Pt resting lying in bed, wife in room with pt.
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
## 2020-08-04 NOTE — ED Notes (Signed)
Attempted to start PIV and draw labs with blood cultures.  Pt difficult IV stick and unable to tolerate same.

## 2020-08-04 NOTE — ED Notes (Signed)
Pt ambulated to restroom with minimal assistance from wife

## 2020-08-04 NOTE — ED Notes (Signed)
EKG given to provider for review

## 2020-08-04 NOTE — Progress Notes (Signed)
Consult placed for IV start. Pt has poor veins and tenses up when being stuck. Pt states he is very scared of needles. Attempted to obtain access with and without [place]. Unable to thread cath with attempts. Bedside RN notified and to request medication to help pt relax prior to another IV attempt.

## 2020-08-04 NOTE — Progress Notes (Signed)
[facility]  ROUNDING NOTE   Subjective:   [name] was admitted to [facility] on 08/04/2020 for Dehydration [E86.0]  Patient was having hypotension and falling at home. On 11/24 he was instructed to stop metolazone and reduce strength of his torsemide. He was to continue spironolactone at current dose.   Last night, patient became very weak and 2i5h lightheadedness and dry mouth. He came to the ED. He states his main complaint is left lower extremity post herpetic neuralgia. When exposed, it was revealed that patient still has active VZV lesions.    Objective:  Vital signs in last 24 hours:  Temp:  [98.1 F (36.7 C)] 98.1 F (36.7 C) (11/28 0100) Pulse Rate:  [102-123] 102 (11/28 1000) Resp:  [11-20] 16 (11/28 0900) BP: (120-127)/(47-110) 127/75 (11/28 1000) SpO2:  [97 %-100 %] 99 % (11/28 1000) Weight:  [580 kg] 112 kg (11/28 0101)  Weight change:  Filed Weights   08/04/20 0101  Weight: 112 kg    Intake/Output: No intake/output data recorded.       Intake/Output this shift:  Total I/O In: 860 [P.O.:360; IV Piggyback:500] Out: 400 [Urine:400]  Physical Exam: General: NAD, laying in stretcher  Head: +moon face, dry mucosal membranes  Eyes: Anicteric, PERRL  Neck: Supple, trachea midline  Lungs:  Clear to auscultation  Heart: Regular rate and rhythm  Abdomen:  Soft, nontender,   Extremities:  trace bilateral lower extremity edema peripheral edema.  Neurologic: Nonfocal, moving all four extremities  Skin: Red purpuric lesions on left lower extremity        Basic Metabolic Panel: Recent Labs  Lab 08/04/20 0116 08/04/20 0740  NA 136  --   K 4.5  --   CL 90*  --   CO2 29  --   GLUCOSE 357*  --   BUN 107*  --   CREATININE 1.93* 2.07*  CALCIUM 8.7*  --     Liver Function Tests: Recent Labs  Lab 08/04/20 0740  AST 73*  ALT 305*  ALKPHOS 134*  BILITOT 0.6  PROT 6.0*  ALBUMIN 2.7*   No results for input(s): LIPASE, AMYLASE in  the last 168 hours. No results for input(s): AMMONIA in the last 168 hours.  CBC: Recent Labs  Lab 08/04/20 0116 08/04/20 0740  WBC 23.6* 22.2*  NEUTROABS  --  18.9*  HGB 12.1* 11.2*  HCT 35.2* 34.0*  MCV 95.7 98.0  PLT 191 159    Cardiac Enzymes: Recent Labs  Lab 08/04/20 0740  CKTOTAL 14*    BNP: Invalid input(s): POCBNP  CBG: No results for input(s): GLUCAP in the last 168 hours.  Microbiology: Results for orders placed or performed during the hospital encounter of 08/04/20  Resp Panel by RT-PCR (Flu A&B, Covid) Nasopharyngeal Swab     Status: None   Collection Time: 08/04/20  5:07 AM   Specimen: Nasopharyngeal Swab; Nasopharyngeal(NP) swabs in vial transport medium  Result Value Ref Range Status   SARS Coronavirus 2 by RT PCR NEGATIVE NEGATIVE Final    Comment: (NOTE) SARS-CoV-2 target nucleic acids are NOT DETECTED.  The SARS-CoV-2 RNA is generally detectable in upper respiratory specimens during the acute phase of infection. The lowest concentration of SARS-CoV-2 viral copies this assay can detect is 138 copies/mL. A negative result does not preclude SARS-Cov-2 infection and should not be used as the sole basis for treatment or other patient management decisions. A negative result may occur with  improper specimen collection/handling, submission of specimen  other than nasopharyngeal swab, presence of viral mutation(s) within the areas targeted by this assay, and inadequate number of viral copies(<138 copies/mL). A negative result must be combined with clinical observations, patient history, and epidemiological information. The expected result is Negative.  Fact Sheet for Patients:  EntrepreneurPulse.com.au  Fact Sheet for Healthcare Providers:  IncredibleEmployment.be  This test is no t yet approved or cleared by the [place] FDA and  has been authorized for detection and/or diagnosis of SARS-CoV-2 by FDA under  an Emergency Use Authorization (EUA). This EUA will remain  in effect (meaning this test can be used) for the duration of the COVID-19 declaration under Section 564(b)(1) of the Act, 21 U.S.C.section 360bbb-3(b)(1), unless the authorization is terminated  or revoked sooner.       Influenza A by PCR NEGATIVE NEGATIVE Final   Influenza B by PCR NEGATIVE NEGATIVE Final    Comment: (NOTE) The Xpert Xpress SARS-CoV-2/FLU/RSV plus assay is intended as an aid in the diagnosis of influenza from Nasopharyngeal swab specimens and should not be used as a sole basis for treatment. Nasal washings and aspirates are unacceptable for Xpert Xpress SARS-CoV-2/FLU/RSV testing.  Fact Sheet for Patients: EntrepreneurPulse.com.au  Fact Sheet for Healthcare Providers: IncredibleEmployment.be  This test is not yet approved or cleared by the [place] FDA and has been authorized for detection and/or diagnosis of SARS-CoV-2 by FDA under an Emergency Use Authorization (EUA). This EUA will remain in effect (meaning this test can be used) for the duration of the COVID-19 declaration under Section 564(b)(1) of the Act, 21 U.S.C. section 360bbb-3(b)(1), unless the authorization is terminated or revoked.  Performed at [facility], [address] [place], [place] [address]     Coagulation Studies: No results for input(s): LABPROT, INR in the last 72 hours.  Urinalysis: Recent Labs    08/04/20 0507  COLORURINE YELLOW*  LABSPEC 1.015  PHURINE 5.0  GLUCOSEU 150*  HGBUR SMALL*  BILIRUBINUR NEGATIVE  KETONESUR NEGATIVE  PROTEINUR 100*  NITRITE NEGATIVE  LEUKOCYTESUR NEGATIVE      Imaging: DG Chest Port 1 View  Result Date: 08/04/2020 CLINICAL DATA:  Dizziness and weakness.  Falls. EXAM: PORTABLE CHEST 1 VIEW COMPARISON:  05/06/2020 FINDINGS: Shallow inspiration. Heart size and pulmonary vascularity are normal. Lungs are clear. No pleural  effusions. No pneumothorax. Postoperative changes in the cervical spine. IMPRESSION: No active disease. Electronically Signed   By: Lucienne Capers M.D.   On: 08/04/2020 05:23     Medications:   . meropenem (MERREM) IV    . vancomycin    . vancomycin     . aspirin  81 mg Oral Daily  . calcium-vitamin D  1 tablet Oral Daily  . enoxaparin (LOVENOX) injection  55 mg Subcutaneous Q24H  . gabapentin  300 mg Oral TID  . insulin aspart  0-20 Units Subcutaneous TID WC  . insulin detemir  20 Units Subcutaneous Daily  . loratadine  10 mg Oral Daily  . multivitamin with minerals  1 tablet Oral Daily  . predniSONE  40 mg Oral BID WC  . simvastatin  40 mg Oral Daily   acetaminophen **OR** acetaminophen, ondansetron **OR** ondansetron (ZOFRAN) IV  Assessment/ Plan:  [name] is a 49 y.o. 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 Currently on high dose steroids.   1. Acute kidney injury: Secondary to nephrotic syndrome from FSGS.  Along with relative hyponatremia and metabolic  alkalosis, suspect overdiuresis. Hold diuretics. Due to risk of volume overload, hold IV fluids and encourage PO intake.  - Continue prednisone.  - holding benazepril - holding spironolactone and torsemide.   2. 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 Elevated lactic acid and procalcitonin levels. Started on empiric antibiotics.   3. Steroid induced hyperglycemia: pending hemoglobin A1c. Started on insulin this admission.    LOS: 0 Stanly Si 11/28/202110:23 AM

## 2020-08-04 NOTE — Progress Notes (Signed)
Pharmacy Antibiotic Note  Russell Grant is a 49 y.o. male admitted on 08/04/2020 with SIRS (does not meet sepsis criteria) with unknown infectious source. Pharmacy has been consulted for vancomycin dosing.  Plan: Vancomycin 2500mg  IV x1 Then start vancomycin 1250mg  IV q24h (nomogram dosing for >100kg and normalized CrCl) Increase to meropenem 1g IV q8h Monitor clinical picture, renal function, vanc levels if needed at Css pending LOT F/U C&S, abx de-escalation, LOT   Height: 5\' 10"  (177.8 cm) Weight: 112 kg (247 lb) IBW/kg (Calculated) : 73  Temp (24hrs), Avg:98.1 F (36.7 C), Min:98.1 F (36.7 C), Max:98.1 F (36.7 C)  Recent Labs  Lab 08/04/20 0116 08/04/20 0740  WBC 23.6* 22.2*  CREATININE 1.93* 2.07*  LATICACIDVEN  --  3.1*    Estimated Creatinine Clearance: 53.5 mL/min (A) (by C-G formula based on SCr of 2.07 mg/dL (H)).    Allergies  Allergen Reactions  . Hydrochlorothiazide Rash  . Cefdinir Rash    Microbiology results: 11/28 BCx:    Brendolyn Patty, PharmD Clinical Pharmacist  08/04/2020   11:24 AM

## 2020-08-05 ENCOUNTER — Ambulatory Visit (HOSPITAL_COMMUNITY): Payer: BC Managed Care – PPO | Admitting: Dermatology

## 2020-08-05 DIAGNOSIS — E860 Dehydration: Secondary | ICD-10-CM

## 2020-08-05 DIAGNOSIS — N032 Chronic nephritic syndrome with diffuse membranous glomerulonephritis: Secondary | ICD-10-CM | POA: Diagnosis not present

## 2020-08-05 DIAGNOSIS — N179 Acute kidney failure, unspecified: Secondary | ICD-10-CM | POA: Diagnosis not present

## 2020-08-05 DIAGNOSIS — R21 Rash and other nonspecific skin eruption: Secondary | ICD-10-CM

## 2020-08-05 DIAGNOSIS — R55 Syncope and collapse: Secondary | ICD-10-CM | POA: Diagnosis not present

## 2020-08-05 DIAGNOSIS — N183 Chronic kidney disease, stage 3 unspecified: Secondary | ICD-10-CM

## 2020-08-05 DIAGNOSIS — I10 Essential (primary) hypertension: Secondary | ICD-10-CM

## 2020-08-05 DIAGNOSIS — B027 Disseminated zoster: Secondary | ICD-10-CM

## 2020-08-05 DIAGNOSIS — R531 Weakness: Secondary | ICD-10-CM

## 2020-08-05 DIAGNOSIS — E119 Type 2 diabetes mellitus without complications: Secondary | ICD-10-CM

## 2020-08-05 LAB — CBC
HCT: 31.5 % — ABNORMAL LOW (ref 39.0–52.0)
Hemoglobin: 10.4 g/dL — ABNORMAL LOW (ref 13.0–17.0)
MCH: 32.4 pg (ref 26.0–34.0)
MCHC: 33 g/dL (ref 30.0–36.0)
MCV: 98.1 fL (ref 80.0–100.0)
Platelets: 135 10*3/uL — ABNORMAL LOW (ref 150–400)
RBC: 3.21 MIL/uL — ABNORMAL LOW (ref 4.22–5.81)
RDW: 17.7 % — ABNORMAL HIGH (ref 11.5–15.5)
WBC: 21 10*3/uL — ABNORMAL HIGH (ref 4.0–10.5)
nRBC: 0.7 % — ABNORMAL HIGH (ref 0.0–0.2)

## 2020-08-05 LAB — BASIC METABOLIC PANEL
Anion gap: 9 (ref 5–15)
BUN: 89 mg/dL — ABNORMAL HIGH (ref 6–20)
CO2: 30 mmol/L (ref 22–32)
Calcium: 8.5 mg/dL — ABNORMAL LOW (ref 8.9–10.3)
Chloride: 98 mmol/L (ref 98–111)
Creatinine, Ser: 1.22 mg/dL (ref 0.61–1.24)
GFR, Estimated: >60 mL/min (ref >60)
Glucose, Bld: 178 mg/dL — ABNORMAL HIGH (ref 70–99)
Potassium: 4.8 mmol/L (ref 3.5–5.1)
Sodium: 137 mmol/L (ref 135–145)

## 2020-08-05 LAB — GLUCOSE, CAPILLARY
Glucose-Capillary: 134 mg/dL — ABNORMAL HIGH (ref 70–99)
Glucose-Capillary: 168 mg/dL — ABNORMAL HIGH (ref 70–99)
Glucose-Capillary: 214 mg/dL — ABNORMAL HIGH (ref 70–99)
Glucose-Capillary: 296 mg/dL — ABNORMAL HIGH (ref 70–99)

## 2020-08-05 MED ORDER — INSULIN ASPART 100 UNIT/ML ~~LOC~~ SOLN
0.0000 [IU] | Freq: Every day | SUBCUTANEOUS | Status: DC
  Administered 2020-08-05: 3 [IU] via SUBCUTANEOUS
  Administered 2020-08-06: 4 [IU] via SUBCUTANEOUS
  Administered 2020-08-07 – 2020-08-08 (×2): 2 [IU] via SUBCUTANEOUS
  Administered 2020-08-09: 3 [IU] via SUBCUTANEOUS
  Administered 2020-08-10: 4 [IU] via SUBCUTANEOUS
  Filled 2020-08-05 (×5): qty 1

## 2020-08-05 MED ORDER — VALACYCLOVIR HCL 500 MG PO TABS
1000.0000 mg | ORAL_TABLET | Freq: Three times a day (TID) | ORAL | Status: DC
  Administered 2020-08-05 – 2020-08-06 (×3): 1000 mg via ORAL
  Filled 2020-08-05 (×3): qty 2

## 2020-08-05 MED ORDER — CALAMINE EX LOTN
TOPICAL_LOTION | Freq: Two times a day (BID) | CUTANEOUS | Status: DC
  Filled 2020-08-05: qty 180

## 2020-08-05 MED ORDER — VANCOMYCIN HCL IN DEXTROSE 1-5 GM/200ML-% IV SOLN
1000.0000 mg | Freq: Two times a day (BID) | INTRAVENOUS | Status: DC
  Filled 2020-08-05 (×2): qty 200
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
## 2020-08-05 NOTE — Progress Notes (Addendum)
PROGRESS NOTE  RYKAR LEBLEU FXT:[id number] DOB: [birth year] DOA: 08/04/2020 PCP: Sofie Hartigan, MD   LOS: 1 day   Brief narrative: As per HPI,  Russell Grant is a 49 y.o. male with medical history significant for hypertension, dyslipidemia, history of focal segmental glomerulosclerosis as well as stage III chronic kidney disease, presented to ED with dizziness weakness for several weeks with recent multiple falls.   Patient was started on metolazone on 07/24/20 by his nephrologist and 1 week later (11/24) he was advised to hold the metolazone as his peripheral edema had improved significantly and patient had complained of multiple falls at home.  He was also advised to reduce his torsemide to 40 mg daily and to continue spironolactone.  He complained of dizziness lightheadedness and dry mouth with increased frequency of urination.  In the ED patient was noted to have elevated creatinine of 1.9 compared to baseline of 1.3 from 2 months back.  WBC was elevated at 22,000.  Platelet count at 159.  Respiratory viral panel was negative including Covid.  Chest x-ray without any acute findings.  Urinalysis showed some protein and hemoglobin in urine.  Patient was then admitted to hospital for worsening renal function.  Nephrology was consulted.  Assessment/Plan:  Principal Problem:   AKI (acute kidney injury) ([employer]) Active Problems:   Essential hypertension   CKD (chronic kidney disease) stage 3, GFR 30-59 ml/min (HCC)   FSGS (focal segmental glomerulosclerosis), tip variant with nephrosis   Generalized weakness   Dehydration   New onset type 2 diabetes mellitus ([place])   SIRS due to infectious process with acute organ dysfunction ([place])  Acute kidney injury Likely secondary to overdiuresis.  Diuretics on hold.  Baseline creatinine around 1.3.  Continue ACE inhibitor's as well.  CK levels were within normal limits.  Follow nephrology recommendations. Lab Results  Component Value Date    CREATININE 1.22 08/05/2020   CREATININE 2.07 (H) 08/04/2020   CREATININE 1.93 (H) 08/04/2020    Hyperglycemia with possible new onset diabetes..  Likely secondary to steroid induced.  Was on steroids for nephrotic syndrome.  Has been started on long-acting insulin and sliding scale insulin..  Hemoglobin A1c at 7.0. Could repeat A1c for confirmation.  Stage III chronic kidney disease/FSGS/nephrotic syndrome On prednisone daily.  Nephrology on board.  Follow recommendations.  SIRS on presentation with lactic acidosis. No clear-cut source of infection.  Patient did have tachycardia, leukocytosis left shift and lactic acidosis on presentation with elevated procalcitonin level.  Has been started on empiric antibiotic until cultures are back. Will consult ID  Generalized weakness with frequent falls Likely secondary to volume depletion.  Fall precautions.  Check physical therapy evaluation.    Obesity (BMI 35) With underlying focal segmental glomerular nephritis. Would benefit from ongoing weight loss.  Dyslipidemia Continue statins and fish oil   Lower extremity purpuric rash. Had blisters in the past. Partial response with Valtrex., will get ID evaluation and discuss with nephrology.  DVT prophylaxis: SCDs Start: 08/04/20 0555, subcu Lovenox   Addendum: As per ID, recommend Skin biopsy. I called Dr Sarina Ser office which didn't go through but was able to leave a voice message on the cellphone.   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 Recommend punch biopsy of the skin. I left a message for Dr. Sarina Ser on the phone to call me back.  Addendum:  08/05/2020 2:57 PM  Text received back from Dr. Nehemiah Massed.  He  is on vacation today.  Called the office and give the patient information for consultation.  Code Status: Full code  Family Communication: Spoke with the patient's wife at bedside   Status is:  Inpatient  Remains inpatient appropriate because:IV treatments appropriate due to intensity of illness or inability to take PO, Inpatient level of care appropriate due to severity of illness and Needs further monitoring, nephrology consultation   Dispo: The patient is from: Home              Anticipated d/c is to: Home              Anticipated d/c date is: 2 days              Patient currently is not medically stable to d/c.   Consultants:  Nephrology  Infectious disease  Procedures:  None  Antibiotics:  . Vancomycin, meropenem 11/28>  Anti-infectives (From admission, onward)   Start     Dose/Rate Route Frequency Ordered Stop   08/05/20 1200  vancomycin (VANCOREADY) IVPB 1250 mg/250 mL        1,250 mg 166.7 mL/hr over 90 Minutes Intravenous Every 24 hours 08/04/20 1128     08/04/20 2200  meropenem (MERREM) 1 g in sodium chloride 0.9 % 100 mL IVPB        1 g 200 mL/hr over 30 Minutes Intravenous Every 8 hours 08/04/20 1128     08/04/20 1100  vancomycin (VANCOREADY) IVPB 500 mg/100 mL        500 mg 100 mL/hr over 60 Minutes Intravenous  Once 08/04/20 1022 08/04/20 1523   08/04/20 1030  vancomycin (VANCOREADY) IVPB 2000 mg/400 mL        2,000 mg 200 mL/hr over 120 Minutes Intravenous  Once 08/04/20 1020 08/04/20 1406   08/04/20 1015  meropenem (MERREM) 1 g in sodium chloride 0.9 % 100 mL IVPB  Status:  Discontinued        1 g 200 mL/hr over 30 Minutes Intravenous Every 12 hours 08/04/20 1011 08/04/20 1128   08/04/20 1015  vancomycin (VANCOCIN) IVPB 1000 mg/200 mL premix  Status:  Discontinued        1,000 mg 200 mL/hr over 60 Minutes Intravenous  Once 08/04/20 1011 08/04/20 1019     Subjective: Today, patient was seen and examined at bedside.  Complains of a mild left lower lower extremity pain.  No fever chills shortness of breath cough nausea vomiting  Objective: Vitals:   08/05/20 0531 08/05/20 0748  BP: 137/73 138/78  Pulse: 87 91  Resp: 19 18  Temp: 97.6 F  (36.4 C) (!) 97.5 F (36.4 C)  SpO2: 100% 100%    Intake/Output Summary (Last 24 hours) at 08/05/2020 0755 Last data filed at 08/05/2020 0743 Gross per 24 hour  Intake 2112.55 ml  Output 2950 ml  Net -837.45 ml   Filed Weights   08/04/20 0101  Weight: 112 kg   Body mass index is 35.44 kg/m.   Physical Exam:  GENERAL: Patient is alert awake and oriented. Not in obvious distress.  Obese HENT: No scleral pallor or icterus. Pupils equally reactive to light. Oral mucosa is moist NECK: is supple, no gross swelling noted. CHEST: Clear to auscultation. No crackles or wheezes.  Diminished breath sounds bilaterally. CVS: S1 and S2 heard, no murmur. Regular rate and rhythm.  ABDOMEN: Soft, non-tender, bowel sounds are present. EXTREMITIES: No edema.  Left lower extremity area with purpuric lesions mostly over the thigh and leg.  Some over the groin and foot. CNS: Cranial nerves are intact. No focal motor deficits. SKIN: warm and dry .  Purpuric rashes over the lower extremity  Data Review: I have personally reviewed the following laboratory data and studies,  CBC: Recent Labs  Lab 08/04/20 0116 08/04/20 0740 08/05/20 0440  WBC 23.6* 22.2* 21.0*  NEUTROABS  --  18.9*  --   HGB 12.1* 11.2* 10.4*  HCT 35.2* 34.0* 31.5*  MCV 95.7 98.0 98.1  PLT 191 159 829*   Basic Metabolic Panel: Recent Labs  Lab 08/04/20 0116 08/04/20 0740 08/05/20 0440  NA 136  --  137  K 4.5  --  4.8  CL 90*  --  98  CO2 29  --  30  GLUCOSE 357*  --  178*  BUN 107*  --  89*  CREATININE 1.93* 2.07* 1.22  CALCIUM 8.7*  --  8.5*   Liver Function Tests: Recent Labs  Lab 08/04/20 0740  AST 73*  ALT 305*  ALKPHOS 134*  BILITOT 0.6  PROT 6.0*  ALBUMIN 2.7*   No results for input(s): LIPASE, AMYLASE in the last 168 hours. No results for input(s): AMMONIA in the last 168 hours. Cardiac Enzymes: Recent Labs  Lab 08/04/20 0740  CKTOTAL 14*   BNP (last 3 results) Recent Labs     04/07/20 0116 04/10/20 1850 05/07/20 0759  BNP 78.5 57.1 11.0    ProBNP (last 3 results) No results for input(s): PROBNP in the last 8760 hours.  CBG: Recent Labs  Lab 08/04/20 1237 08/04/20 1502 08/04/20 2140 08/05/20 0733  GLUCAP 135* 203* 311* 134*   Recent Results (from the past 240 hour(s))  Resp Panel by RT-PCR (Flu A&B, Covid) Nasopharyngeal Swab     Status: None   Collection Time: 08/04/20  5:07 AM   Specimen: Nasopharyngeal Swab; Nasopharyngeal(NP) swabs in vial transport medium  Result Value Ref Range Status   SARS Coronavirus 2 by RT PCR NEGATIVE NEGATIVE Final    Comment: (NOTE) SARS-CoV-2 target nucleic acids are NOT DETECTED.  The SARS-CoV-2 RNA is generally detectable in upper respiratory specimens during the acute phase of infection. The lowest concentration of SARS-CoV-2 viral copies this assay can detect is 138 copies/mL. A negative result does not preclude SARS-Cov-2 infection and should not be used as the sole basis for treatment or other patient management decisions. A negative result may occur with  improper specimen collection/handling, submission of specimen other than nasopharyngeal swab, presence of viral mutation(s) within the areas targeted by this assay, and inadequate number of viral copies(<138 copies/mL). A negative result must be combined with clinical observations, patient history, and epidemiological information. The expected result is Negative.  Fact Sheet for Patients:  EntrepreneurPulse.com.au  Fact Sheet for Healthcare Providers:  IncredibleEmployment.be  This test is no t yet approved or cleared by the [place] FDA and  has been authorized for detection and/or diagnosis of SARS-CoV-2 by FDA under an Emergency Use Authorization (EUA). This EUA will remain  in effect (meaning this test can be used) for the duration of the COVID-19 declaration under Section 564(b)(1) of the Act,  21 U.S.C.section 360bbb-3(b)(1), unless the authorization is terminated  or revoked sooner.       Influenza A by PCR NEGATIVE NEGATIVE Final   Influenza B by PCR NEGATIVE NEGATIVE Final    Comment: (NOTE) The Xpert Xpress SARS-CoV-2/FLU/RSV plus assay is intended as an aid in the diagnosis of influenza from Nasopharyngeal swab specimens and should not be used  as a sole basis for treatment. Nasal washings and aspirates are unacceptable for Xpert Xpress SARS-CoV-2/FLU/RSV testing.  Fact Sheet for Patients: EntrepreneurPulse.com.au  Fact Sheet for Healthcare Providers: IncredibleEmployment.be  This test is not yet approved or cleared by the [place] FDA and has been authorized for detection and/or diagnosis of SARS-CoV-2 by FDA under an Emergency Use Authorization (EUA). This EUA will remain in effect (meaning this test can be used) for the duration of the COVID-19 declaration under Section 564(b)(1) of the Act, 21 U.S.C. section 360bbb-3(b)(1), unless the authorization is terminated or revoked.  Performed at [facility], [address] [place], [place] [address]   Culture, blood (routine x 2)     Status: None (Preliminary result)   Collection Time: 08/04/20  7:40 AM   Specimen: BLOOD  Result Value Ref Range Status   Specimen Description BLOOD LEFT ANTECUBITAL  Final   Special Requests   Final    BOTTLES DRAWN AEROBIC AND ANAEROBIC Blood Culture results may not be optimal due to an excessive volume of blood received in culture bottles   Culture   Final    NO GROWTH < 24 HOURS Performed at [facility], [address] [place], [place] [address]    Report Status PENDING  Incomplete  Culture, blood (routine x 2)     Status: None (Preliminary result)   Collection Time: 08/04/20  2:40 PM   Specimen: BLOOD  Result Value Ref Range Status   Specimen Description BLOOD RIGHT ANTECUBITAL  Final   Special Requests    Final    BOTTLES DRAWN AEROBIC AND ANAEROBIC Blood Culture results may not be optimal due to an inadequate volume of blood received in culture bottles   Culture   Final    NO GROWTH < 24 HOURS Performed at [facility], [address] [place], [place] [address]    Report Status PENDING  Incomplete     Studies: DG Chest Port 1 View  Result Date: 08/04/2020 CLINICAL DATA:  Dizziness and weakness.  Falls. EXAM: PORTABLE CHEST 1 VIEW COMPARISON:  05/06/2020 FINDINGS: Shallow inspiration. Heart size and pulmonary vascularity are normal. Lungs are clear. No pleural effusions. No pneumothorax. Postoperative changes in the cervical spine. IMPRESSION: No active disease. Electronically Signed   By: Lucienne Capers M.D.   On: 08/04/2020 05:23      Flora Lipps, MD  [facility] 08/05/2020

## 2020-08-05 NOTE — Progress Notes (Addendum)
[facility]  ROUNDING NOTE   Subjective:   [name] was admitted to [facility] on 08/04/2020 for Dehydration [E86.0] Dizziness [R42] Diuretic overdose, accidental or unintentional, initial encounter [T50.2X1A] Leukocytosis, unspecified type [D72.829] Chronic kidney disease, unspecified CKD stage [N18.9]  Patient resting in bed, wife at bedside.  ID team in to assess for left lower extremity rashes and possible post herpetic neuralgia.   Objective:  Vital signs in last 24 hours:  Temp:  [97.5 F (36.4 C)-98.7 F (37.1 C)] 97.9 F (36.6 C) (11/29 1137) Pulse Rate:  [87-109] 109 (11/29 1137) Resp:  [18-19] 18 (11/29 1137) BP: (121-138)/(59-78) 121/59 (11/29 1137) SpO2:  [97 %-100 %] 99 % (11/29 1137)  Weight change:  Filed Weights   08/04/20 0101  Weight: 112 kg    Intake/Output: I/O last 3 completed shifts: In: 2112.6 [P.O.:960; I.V.:40.3; IV Piggyback:1112.3] Out: 2550 [Urine:2550]       Intake/Output this shift:  Total I/O In: 240 [P.O.:240] Out: 700 [Urine:700]  Physical Exam: General:  In no acute distress  Head:  Normocephalic, atraumatic, + facial swelling  Eyes:  Sclerae and conjunctivae clear  Lungs:  Clear to auscultation bilaterally, respiration  symmetrical and unlabored  Heart:  S1S2, no rubs or gallops  Abdomen:  Soft, nontender, nondistended  Extremities:  trace bilateral lower extremity edema peripheral edema.  Neurologic:  Oriented x3  Skin: Red purpuric lesions on left lower extremity from left lower back down        Basic Metabolic Panel: Recent Labs  Lab 08/04/20 0116 08/04/20 0740 08/05/20 0440  NA 136  --  137  K 4.5  --  4.8  CL 90*  --  98  CO2 29  --  30  GLUCOSE 357*  --  178*  BUN 107*  --  89*  CREATININE 1.93* 2.07* 1.22  CALCIUM 8.7*  --  8.5*    Liver Function Tests: Recent Labs  Lab 08/04/20 0740  AST 73*  ALT 305*  ALKPHOS 134*  BILITOT 0.6  PROT 6.0*  ALBUMIN 2.7*   No results for  input(s): LIPASE, AMYLASE in the last 168 hours. No results for input(s): AMMONIA in the last 168 hours.  CBC: Recent Labs  Lab 08/04/20 0116 08/04/20 0740 08/05/20 0440  WBC 23.6* 22.2* 21.0*  NEUTROABS  --  18.9*  --   HGB 12.1* 11.2* 10.4*  HCT 35.2* 34.0* 31.5*  MCV 95.7 98.0 98.1  PLT 191 159 135*    Cardiac Enzymes: Recent Labs  Lab 08/04/20 0740  CKTOTAL 14*    BNP: Invalid input(s): POCBNP  CBG: Recent Labs  Lab 08/04/20 1237 08/04/20 1502 08/04/20 2140 08/05/20 0733 08/05/20 1134  GLUCAP 135* 203* 311* 134* 168*    Microbiology: Results for orders placed or performed during the hospital encounter of 08/04/20  Resp Panel by RT-PCR (Flu A&B, Covid) Nasopharyngeal Swab     Status: None   Collection Time: 08/04/20  5:07 AM   Specimen: Nasopharyngeal Swab; Nasopharyngeal(NP) swabs in vial transport medium  Result Value Ref Range Status   SARS Coronavirus 2 by RT PCR NEGATIVE NEGATIVE Final    Comment: (NOTE) SARS-CoV-2 target nucleic acids are NOT DETECTED.  The SARS-CoV-2 RNA is generally detectable in upper respiratory specimens during the acute phase of infection. The lowest concentration of SARS-CoV-2 viral copies this assay can detect is 138 copies/mL. A negative result does not preclude SARS-Cov-2 infection and should not be used as the sole basis for treatment or  other patient management decisions. A negative result may occur with  improper specimen collection/handling, submission of specimen other than nasopharyngeal swab, presence of viral mutation(s) within the areas targeted by this assay, and inadequate number of viral copies(<138 copies/mL). A negative result must be combined with clinical observations, patient history, and epidemiological information. The expected result is Negative.  Fact Sheet for Patients:  EntrepreneurPulse.com.au  Fact Sheet for Healthcare Providers:   IncredibleEmployment.be  This test is no t yet approved or cleared by the [place] FDA and  has been authorized for detection and/or diagnosis of SARS-CoV-2 by FDA under an Emergency Use Authorization (EUA). This EUA will remain  in effect (meaning this test can be used) for the duration of the COVID-19 declaration under Section 564(b)(1) of the Act, 21 U.S.C.section 360bbb-3(b)(1), unless the authorization is terminated  or revoked sooner.       Influenza A by PCR NEGATIVE NEGATIVE Final   Influenza B by PCR NEGATIVE NEGATIVE Final    Comment: (NOTE) The Xpert Xpress SARS-CoV-2/FLU/RSV plus assay is intended as an aid in the diagnosis of influenza from Nasopharyngeal swab specimens and should not be used as a sole basis for treatment. Nasal washings and aspirates are unacceptable for Xpert Xpress SARS-CoV-2/FLU/RSV testing.  Fact Sheet for Patients: EntrepreneurPulse.com.au  Fact Sheet for Healthcare Providers: IncredibleEmployment.be  This test is not yet approved or cleared by the [place] FDA and has been authorized for detection and/or diagnosis of SARS-CoV-2 by FDA under an Emergency Use Authorization (EUA). This EUA will remain in effect (meaning this test can be used) for the duration of the COVID-19 declaration under Section 564(b)(1) of the Act, 21 U.S.C. section 360bbb-3(b)(1), unless the authorization is terminated or revoked.  Performed at [facility], [address] [place], [place] [address]   Culture, blood (routine x 2)     Status: None (Preliminary result)   Collection Time: 08/04/20  7:40 AM   Specimen: BLOOD  Result Value Ref Range Status   Specimen Description BLOOD LEFT ANTECUBITAL  Final   Special Requests   Final    BOTTLES DRAWN AEROBIC AND ANAEROBIC Blood Culture results may not be optimal due to an excessive volume of blood received in culture bottles   Culture   Final     NO GROWTH < 24 HOURS Performed at [facility], [address], [place], [place] [address]    Report Status PENDING  Incomplete  Culture, blood (routine x 2)     Status: None (Preliminary result)   Collection Time: 08/04/20  2:40 PM   Specimen: BLOOD  Result Value Ref Range Status   Specimen Description BLOOD RIGHT ANTECUBITAL  Final   Special Requests   Final    BOTTLES DRAWN AEROBIC AND ANAEROBIC Blood Culture results may not be optimal due to an inadequate volume of blood received in culture bottles   Culture   Final    NO GROWTH < 24 HOURS Performed at [facility], [address], [place],  [address]    Report Status PENDING  Incomplete    Coagulation Studies: Recent Labs    08/04/20 1032  LABPROT 12.2  INR 0.9    Urinalysis: Recent Labs    08/04/20 0507  COLORURINE YELLOW*  LABSPEC 1.015  PHURINE 5.0  GLUCOSEU 150*  HGBUR SMALL*  BILIRUBINUR NEGATIVE  KETONESUR NEGATIVE  PROTEINUR 100*  NITRITE NEGATIVE  LEUKOCYTESUR NEGATIVE      Imaging: DG Chest Port 1 View  Result Date:  08/04/2020 CLINICAL DATA:  Dizziness and weakness.  Falls. EXAM: PORTABLE CHEST 1 VIEW COMPARISON:  05/06/2020 FINDINGS: Shallow inspiration. Heart size and pulmonary vascularity are normal. Lungs are clear. No pleural effusions. No pneumothorax. Postoperative changes in the cervical spine. IMPRESSION: No active disease. Electronically Signed   By: Lucienne Capers M.D.   On: 08/04/2020 05:23     Medications:    sodium chloride Stopped (08/05/20 1610)    aspirin  81 mg Oral Daily   calcium-vitamin D  1 tablet Oral Daily   enoxaparin (LOVENOX) injection  55 mg Subcutaneous Q24H   gabapentin  300 mg Oral TID   insulin aspart  0-20 Units Subcutaneous TID WC   insulin detemir  20 Units Subcutaneous Daily   loratadine  10 mg Oral Daily   multivitamin with minerals  1 tablet Oral Daily   predniSONE  40 mg Oral BID WC   simvastatin  40 mg Oral Daily    valACYclovir  1,000 mg Oral TID   sodium chloride, acetaminophen **OR** acetaminophen, ondansetron **OR** ondansetron (ZOFRAN) IV  Assessment/ Plan:  [name] is a 49 y.o. 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 Currently on high dose steroids.   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  Along with relative hyponatremia and metabolic alkalosis, suspect overdiuresis. 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 I agree with the note.  Will discuss with Dr Juleen China regarding reducing dose of Prednisone now that UPC has improved.

## 2020-08-05 NOTE — Progress Notes (Signed)
Pharmacy Antibiotic Note  Russell Grant is a 49 y.o. male admitted on 08/04/2020 with SIRS (does not meet sepsis criteria) with unknown infectious source.   Pharmacy has been consulted for vancomycin dosing.  Plan: Vancomycin 2500mg  IV x1  Start vancomycin 1000mg  IV q12h (nomogram dosing for >100kg and CrCl > 54ml/min)  Continue meropenem 1g IV q8h Monitor clinical picture, renal function, vanc levels if needed at Css pending LOT F/U C&S, abx de-escalation, LOT   Height: 5\' 10"  (177.8 cm) Weight: 112 kg (247 lb) IBW/kg (Calculated) : 73  Temp (24hrs), Avg:97.9 F (36.6 C), Min:97.5 F (36.4 C), Max:98.7 F (37.1 C)  Recent Labs  Lab 08/04/20 0116 08/04/20 0740 08/04/20 1032 08/04/20 1440 08/05/20 0440  WBC 23.6* 22.2*  --   --  21.0*  CREATININE 1.93* 2.07*  --   --  1.22  LATICACIDVEN  --  3.1* 3.4* 3.1*  --     Estimated Creatinine Clearance: 90.8 mL/min (by C-G formula based on SCr of 1.22 mg/dL).    Allergies  Allergen Reactions  . Hydrochlorothiazide Rash  . Cefdinir Rash    Microbiology results: 11/28 BCx: pending  Lu Duffel, PharmD, BCPS Clinical Pharmacist 08/05/2020 8:07 AM

## 2020-08-05 NOTE — Progress Notes (Signed)
Mobility Specialist - Progress Note   08/05/20 1134  Mobility  Activity Ambulated in room (4x)  Level of Assistance Modified independent, requires aide device or extra time  Assistive Device Front wheel walker  Distance Ambulated (ft) 80 ft  Mobility Response Tolerated well  Mobility performed by Mobility specialist  $Mobility charge 1 Mobility    Pre-mobility: 117 HR During mobility: 138 HR Post-mobility: 111 HR   Pt laying in bed w/ wife present in room upon arrival. Pt states he is "feeling weak" and slight pain where "shingles" are located. However, pt still willing to participate in session. Pt ambulated in room 4x (80' total) w/o AD. Pt mod. Independent t/o session requiring extra time w/ bed mobility and ambulation d/t pain and feeling weak. Overall, pt tolerated session well. Pt very pleasant and motivated. Pt left sitting EOB w/ wife present in room. All needs placed in reach.     Davine Sweney Mobility Specialist  08/05/20, 11:37 AM

## 2020-08-06 ENCOUNTER — Ambulatory Visit: Payer: BC Managed Care – PPO | Admitting: Dermatology

## 2020-08-06 DIAGNOSIS — R2242 Localized swelling, mass and lump, left lower limb: Secondary | ICD-10-CM

## 2020-08-06 DIAGNOSIS — R42 Dizziness and giddiness: Secondary | ICD-10-CM

## 2020-08-06 LAB — COMPREHENSIVE METABOLIC PANEL
ALT: 242 U/L — ABNORMAL HIGH (ref 0–44)
AST: 80 U/L — ABNORMAL HIGH (ref 15–41)
Albumin: 2.2 g/dL — ABNORMAL LOW (ref 3.5–5.0)
Alkaline Phosphatase: 106 U/L (ref 38–126)
Anion gap: 10 (ref 5–15)
BUN: 71 mg/dL — ABNORMAL HIGH (ref 6–20)
CO2: 27 mmol/L (ref 22–32)
Calcium: 8.3 mg/dL — ABNORMAL LOW (ref 8.9–10.3)
Chloride: 98 mmol/L (ref 98–111)
Creatinine, Ser: 1.19 mg/dL (ref 0.61–1.24)
GFR, Estimated: >60 mL/min (ref >60)
Glucose, Bld: 212 mg/dL — ABNORMAL HIGH (ref 70–99)
Potassium: 4.5 mmol/L (ref 3.5–5.1)
Sodium: 135 mmol/L (ref 135–145)
Total Bilirubin: 0.9 mg/dL (ref 0.3–1.2)
Total Protein: 4.8 g/dL — ABNORMAL LOW (ref 6.5–8.1)

## 2020-08-06 LAB — CBC
HCT: 28.3 % — ABNORMAL LOW (ref 39.0–52.0)
Hemoglobin: 9.5 g/dL — ABNORMAL LOW (ref 13.0–17.0)
MCH: 32.4 pg (ref 26.0–34.0)
MCHC: 33.6 g/dL (ref 30.0–36.0)
MCV: 96.6 fL (ref 80.0–100.0)
Platelets: 124 10*3/uL — ABNORMAL LOW (ref 150–400)
RBC: 2.93 MIL/uL — ABNORMAL LOW (ref 4.22–5.81)
RDW: 17.3 % — ABNORMAL HIGH (ref 11.5–15.5)
WBC: 16.8 10*3/uL — ABNORMAL HIGH (ref 4.0–10.5)
nRBC: 1.1 % — ABNORMAL HIGH (ref 0.0–0.2)

## 2020-08-06 LAB — MAGNESIUM: Magnesium: 2.6 mg/dL — ABNORMAL HIGH (ref 1.7–2.4)

## 2020-08-06 LAB — GLUCOSE, CAPILLARY
Glucose-Capillary: 168 mg/dL — ABNORMAL HIGH (ref 70–99)
Glucose-Capillary: 293 mg/dL — ABNORMAL HIGH (ref 70–99)
Glucose-Capillary: 249 mg/dL — ABNORMAL HIGH (ref 70–99)
Glucose-Capillary: 313 mg/dL — ABNORMAL HIGH (ref 70–99)

## 2020-08-06 LAB — PHOSPHORUS: Phosphorus: 3.7 mg/dL (ref 2.5–4.6)

## 2020-08-06 LAB — HEMOGLOBIN A1C
Hgb A1c MFr Bld: 7.1 % — ABNORMAL HIGH (ref 4.8–5.6)
Mean Plasma Glucose: 157.07 mg/dL

## 2020-08-06 MED ORDER — DEXTROSE 5 % IV SOLN
850.0000 mg | Freq: Three times a day (TID) | INTRAVENOUS | Status: DC
  Administered 2020-08-07 – 2020-08-10 (×12): 850 mg via INTRAVENOUS
  Filled 2020-08-06 (×18): qty 17

## 2020-08-06 MED ORDER — DEXTROSE 5 % IV SOLN
10.0000 mg/kg | Freq: Three times a day (TID) | INTRAVENOUS | Status: AC
  Administered 2020-08-06 (×2): 1120 mg via INTRAVENOUS
  Filled 2020-08-06 (×2): qty 22.4

## 2020-08-06 MED ORDER — DEXTROSE 5 % IV SOLN
10.0000 mg/kg | Freq: Three times a day (TID) | INTRAVENOUS | Status: DC
  Filled 2020-08-06 (×2): qty 22.4

## 2020-08-06 MED ORDER — PREDNISONE 20 MG PO TABS
20.0000 mg | ORAL_TABLET | Freq: Every day | ORAL | Status: DC
  Administered 2020-08-06 – 2020-08-10 (×5): 20 mg via ORAL
  Filled 2020-08-06 (×5): qty 1

## 2020-08-06 MED ORDER — PREDNISONE 20 MG PO TABS
40.0000 mg | ORAL_TABLET | Freq: Every day | ORAL | Status: DC
  Administered 2020-08-07 – 2020-08-11 (×5): 40 mg via ORAL
  Filled 2020-08-06 (×7): qty 2

## 2020-08-06 MED ORDER — WITCH HAZEL-GLYCERIN EX PADS
MEDICATED_PAD | CUTANEOUS | Status: DC | PRN
  Filled 2020-08-06 (×2): qty 100

## 2020-08-06 MED ORDER — GABAPENTIN 400 MG PO CAPS
400.0000 mg | ORAL_CAPSULE | Freq: Three times a day (TID) | ORAL | Status: DC
  Administered 2020-08-06 – 2020-08-07 (×3): 400 mg via ORAL
  Filled 2020-08-06 (×3): qty 1

## 2020-08-06 NOTE — Progress Notes (Addendum)
PROGRESS NOTE  Russell Grant UDJ:[id number] DOB: [birth year] DOA: 08/04/2020 PCP: Sofie Hartigan, MD   LOS: 2 days   Brief narrative: As per HPI,  Russell Grant is a 49 y.o. male with medical history significant for hypertension, dyslipidemia, history of focal segmental glomerulosclerosis, stage III chronic kidney disease presented to ED with dizziness, weakness for several weeks with recent multiple falls.   Patient was started on metolazone on 07/24/20 by his nephrologist and 1 week later (11/24) he was advised to hold the metolazone since his peripheral edema had improved significantly and patient had complained of multiple falls at home.  He was also advised to reduce his torsemide to 40 mg daily and to continue spironolactone.  He complained of dizziness, lightheadedness and dry mouth with increased frequency of urination.  In the ED, patient was noted to have elevated creatinine of 1.9 compared to baseline of 1.3 from 2 months back.  WBC was elevated at 22,000.    Respiratory viral panel was negative including Covid.  Chest x-ray without any acute findings.  Urinalysis showed some protein and hemoglobin in urine.  Patient was then admitted to hospital for worsening renal function and left lower extremity painful rash.  Nephrology was consulted and patient was admitted to the hospital.  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  Diuretics on hold.  Baseline creatinine around 1.3.  Hold ACEI. CK levels were within  normal limits. Nephrology on board and will follow recommendations.  On high-dose prednisone at home.  Currently on a lower dose of prednisone as per nephrology.  Lab Results  Component Value Date   CREATININE 1.19 08/06/2020   CREATININE 1.22 08/05/2020   CREATININE 2.07 (H) 08/04/2020    Hyperglycemia with new onset diabetes mellitus type 2.  Was on steroids for nephrotic syndrome.  Could be steroid-induced.  Has been started on long-acting insulin and sliding scale insulin.add mealtime insulin as needed. we will continue to monitor closely.  Hemoglobin A1c at 7.0 presentation.. Repeat A1c today with her A1c of 7.1.  Would benefit from antidiabetic medications on discharge.  Stage III chronic kidney disease/FSGS/nephrotic syndrome On prednisone daily for nephrotic syndrome..  Nephrology on board.  Follow recommendations.  SIRS on presentation with lactic acidosis. No clear-cut source of infection.  Patient did have tachycardia, leukocytosis, left shift and lactic acidosis on presentation with elevated procalcitonin level.  Had been started on empiric antibiotics initially.  Infectious disease was consulted and patient is currently off antibiotic.  Trending down leukocytosis to 16.8 from 21,000 yesterday.  Generalized weakness with frequent falls Likely secondary to volume depletion.  Will need physical therapy evaluation.  Obesity (BMI 35) With underlying focal segmental glomerulonephritis. Would benefit from ongoing weight loss.  Will get PT evaluation.  Dyslipidemia Continue statins and fish oil  Disseminated herpes zoster.. Had blisters in the past. Partial response with Valtrex., ID evaluation with suspicion for herpes zoster but recommend a skin biopsy to rule out vasculitis.  Spoke with Dr. Nicole Kindred with [facility] and patient has undergone punch biopsy on 08/05/2020.  Spoke with Dr. Nicole Kindred today.  She recommends IV acyclovir and Neurontin increased dose.  Will  adjust doses.  Communicated with infectious disease and nephrology.  Will consult pharmacy for IV acyclovir.  Increase the dose of Neurontin to 400 mg 3 times daily.  Patient takes 300 mg three times a day at home.  DC Valtrex.  Will likely need Valtrex prophylactic dose while on steroids after discharge. Continue airborne precautions.  Follow skin biopsy, should be published in epic.  DVT prophylaxis: SCDs Start: 08/04/20 0555, subcu Lovenox   Code Status: Full code  Family Communication: I again spoke with the patient's wife at bedside today about the clinical condition of the patient  Status is: Inpatient  Remains inpatient appropriate because:IV treatments appropriate due to intensity of illness or inability to take PO, Inpatient level of care appropriate due to severity of illness and Needs further monitoring, nephrology consultation   Dispo: The patient is from: Home              Anticipated d/c is to: Home              Anticipated d/c date is: 2 to 3 days.  As per ID and nephrology recommendation.              Patient currently is not medically stable to d/c.   Consultants:  Nephrology  Infectious disease  Dermatology  Procedures:  Punch biopsy of skin lesion on 08/05/2020 by dermatology Dr. Nicole Kindred  Antibiotics:  . Vancomycin, meropenem 11/28> discontinued . Valtrex > 08/05/2020-11/30 . IV acyclovir 11/30>  Anti-infectives (From admission, onward)   Start     Dose/Rate Route Frequency Ordered Stop   08/05/20 1600  valACYclovir (VALTREX) tablet 1,000 mg        1,000 mg Oral 3 times daily 08/05/20 1329     08/05/20 1200  vancomycin (VANCOREADY) IVPB 1250 mg/250 mL  Status:  Discontinued        1,250 mg 166.7 mL/hr over 90 Minutes Intravenous Every 24 hours 08/04/20 1128 08/05/20 0805   08/05/20 1000  vancomycin (VANCOCIN) IVPB 1000 mg/200 mL premix  Status:  Discontinued        1,000 mg 200 mL/hr over 60 Minutes Intravenous Every 12 hours 08/05/20 0805 08/05/20  1308   08/04/20 2200  meropenem (MERREM) 1 g in sodium chloride 0.9 % 100 mL IVPB  Status:  Discontinued        1 g 200 mL/hr over 30 Minutes Intravenous Every 8 hours 08/04/20 1128 08/05/20 1308   08/04/20 1100  vancomycin (VANCOREADY) IVPB 500 mg/100 mL        500 mg 100 mL/hr over 60 Minutes Intravenous  Once 08/04/20 1022 08/04/20 1523   08/04/20 1030  vancomycin (VANCOREADY) IVPB 2000 mg/400 mL        2,000 mg 200 mL/hr over 120 Minutes Intravenous  Once 08/04/20 1020 08/04/20 1406   08/04/20 1015  meropenem (MERREM) 1 g in sodium chloride 0.9 % 100 mL IVPB  Status:  Discontinued        1 g 200 mL/hr over 30 Minutes Intravenous Every 12 hours 08/04/20 1011 08/04/20 1128   08/04/20 1015  vancomycin (VANCOCIN) IVPB 1000 mg/200 mL premix  Status:  Discontinued        1,000 mg 200 mL/hr over 60 Minutes Intravenous  Once 08/04/20 1011 08/04/20 1019     Subjective: Today, patient was seen and examined at bedside.  Still complains of a erythematous rash with some pain  over the lower extremity.  Denies any cough, chest pain, nausea, vomiting  Objective: Vitals:   08/05/20 1939 08/06/20 0443  BP: (!) 164/84 (!) 153/82  Pulse: (!) 110 91  Resp: 18 18  Temp: 97.8 F (36.6 C) 97.9 F (36.6 C)  SpO2: 96% 98%    Intake/Output Summary (Last 24 hours) at 08/06/2020 0742 Last data filed at 08/06/2020 0443 Gross per 24 hour  Intake 960 ml  Output 2300 ml  Net -1340 ml   Filed Weights   08/04/20 0101  Weight: 112 kg   Body mass index is 35.44 kg/m.   Physical Exam:  GENERAL: Patient is alert awake and oriented. Not in obvious distress.  Obese HENT: No scleral pallor or icterus. Pupils equally reactive to light. Oral mucosa is moist NECK: is supple, no gross swelling noted. CHEST: Clear to auscultation. No crackles or wheezes.  Diminished breath sounds bilaterally. CVS: S1 and S2 heard, no murmur. Regular rate and rhythm.  ABDOMEN: Soft, non-tender, bowel sounds are  present. EXTREMITIES: No edema.  Left lower extremity area with purpuric lesions mostly over the thigh and leg.  Some over the groin and foot.  Some erythematous lesions over the wrists/hands. CNS: Cranial nerves are intact. No focal motor deficits. SKIN: warm and dry .  Herpetic zoster lesions over the left lower extremity, including leg thigh buttocks area, groin..  Data Review: I have personally reviewed the following laboratory data and studies,  CBC: Recent Labs  Lab 08/04/20 0116 08/04/20 0740 08/05/20 0440 08/06/20 0611  WBC 23.6* 22.2* 21.0* 16.8*  NEUTROABS  --  18.9*  --   --   HGB 12.1* 11.2* 10.4* 9.5*  HCT 35.2* 34.0* 31.5* 28.3*  MCV 95.7 98.0 98.1 96.6  PLT 191 159 135* 275*   Basic Metabolic Panel: Recent Labs  Lab 08/04/20 0116 08/04/20 0740 08/05/20 0440 08/06/20 0611  NA 136  --  137 135  K 4.5  --  4.8 4.5  CL 90*  --  98 98  CO2 29  --  30 27  GLUCOSE 357*  --  178* 212*  BUN 107*  --  89* 71*  CREATININE 1.93* 2.07* 1.22 1.19  CALCIUM 8.7*  --  8.5* 8.3*  MG  --   --   --  2.6*  PHOS  --   --   --  3.7   Liver Function Tests: Recent Labs  Lab 08/04/20 0740 08/06/20 0611  AST 73* 80*  ALT 305* 242*  ALKPHOS 134* 106  BILITOT 0.6 0.9  PROT 6.0* 4.8*  ALBUMIN 2.7* 2.2*   No results for input(s): LIPASE, AMYLASE in the last 168 hours. No results for input(s): AMMONIA in the last 168 hours. Cardiac Enzymes: Recent Labs  Lab 08/04/20 0740  CKTOTAL 14*   BNP (last 3 results) Recent Labs    04/07/20 0116 04/10/20 1850 05/07/20 0759  BNP 78.5 57.1 11.0    ProBNP (last 3 results) No results for input(s): PROBNP in the last 8760 hours.  CBG: Recent Labs  Lab 08/05/20 0733 08/05/20 1134 08/05/20 1611 08/05/20 2109 08/06/20 0735  GLUCAP 134* 168* 214* 296* 168*   Recent Results (from the past 240 hour(s))  Resp Panel by RT-PCR (Flu A&B, Covid) Nasopharyngeal Swab     Status: None   Collection Time: 08/04/20  5:07 AM    Specimen: Nasopharyngeal Swab; Nasopharyngeal(NP) swabs in vial transport medium  Result Value Ref Range Status   SARS Coronavirus 2 by RT PCR  NEGATIVE NEGATIVE Final    Comment: (NOTE) SARS-CoV-2 target nucleic acids are NOT DETECTED.  The SARS-CoV-2 RNA is generally detectable in upper respiratory specimens during the acute phase of infection. The lowest concentration of SARS-CoV-2 viral copies this assay can detect is 138 copies/mL. A negative result does not preclude SARS-Cov-2 infection and should not be used as the sole basis for treatment or other patient management decisions. A negative result may occur with  improper specimen collection/handling, submission of specimen other than nasopharyngeal swab, presence of viral mutation(s) within the areas targeted by this assay, and inadequate number of viral copies(<138 copies/mL). A negative result must be combined with clinical observations, patient history, and epidemiological information. The expected result is Negative.  Fact Sheet for Patients:  EntrepreneurPulse.com.au  Fact Sheet for Healthcare Providers:  IncredibleEmployment.be  This test is no t yet approved or cleared by the [place] FDA and  has been authorized for detection and/or diagnosis of SARS-CoV-2 by FDA under an Emergency Use Authorization (EUA). This EUA will remain  in effect (meaning this test can be used) for the duration of the COVID-19 declaration under Section 564(b)(1) of the Act, 21 U.S.C.section 360bbb-3(b)(1), unless the authorization is terminated  or revoked sooner.       Influenza A by PCR NEGATIVE NEGATIVE Final   Influenza B by PCR NEGATIVE NEGATIVE Final    Comment: (NOTE) The Xpert Xpress SARS-CoV-2/FLU/RSV plus assay is intended as an aid in the diagnosis of influenza from Nasopharyngeal swab specimens and should not be used as a sole basis for treatment. Nasal washings and aspirates are  unacceptable for Xpert Xpress SARS-CoV-2/FLU/RSV testing.  Fact Sheet for Patients: EntrepreneurPulse.com.au  Fact Sheet for Healthcare Providers: IncredibleEmployment.be  This test is not yet approved or cleared by the [place] FDA and has been authorized for detection and/or diagnosis of SARS-CoV-2 by FDA under an Emergency Use Authorization (EUA). This EUA will remain in effect (meaning this test can be used) for the duration of the COVID-19 declaration under Section 564(b)(1) of the Act, 21 U.S.C. section 360bbb-3(b)(1), unless the authorization is terminated or revoked.  Performed at [facility], [address] [place], [place] [address]   Culture, blood (routine x 2)     Status: None (Preliminary result)   Collection Time: 08/04/20  7:40 AM   Specimen: BLOOD  Result Value Ref Range Status   Specimen Description BLOOD LEFT ANTECUBITAL  Final   Special Requests   Final    BOTTLES DRAWN AEROBIC AND ANAEROBIC Blood Culture results may not be optimal due to an excessive volume of blood received in culture bottles   Culture   Final    NO GROWTH < 24 HOURS Performed at [facility], [address], [place], [place] [address]    Report Status PENDING  Incomplete  Culture, blood (routine x 2)     Status: None (Preliminary result)   Collection Time: 08/04/20  2:40 PM   Specimen: BLOOD  Result Value Ref Range Status   Specimen Description BLOOD RIGHT ANTECUBITAL  Final   Special Requests   Final    BOTTLES DRAWN AEROBIC AND ANAEROBIC Blood Culture results may not be optimal due to an inadequate volume of blood received in culture bottles   Culture   Final    NO GROWTH < 24 HOURS Performed at [facility], [address] [place], [place] [address]    Report Status PENDING  Incomplete     Studies: No results found.  Flora Lipps, MD  [facility] 08/06/2020

## 2020-08-06 NOTE — Progress Notes (Signed)
Date of Admission:  08/04/2020     ID: Russell Grant is a 49 y.o. male Principal Problem:   AKI (acute kidney injury) ([place]) Active Problems:   Essential hypertension   CKD (chronic kidney disease) stage 3, GFR 30-59 ml/min (HCC)   FSGS (focal segmental glomerulosclerosis), tip variant with nephrosis   Generalized weakness   Dehydration   New onset type 2 diabetes mellitus ([place])   SIRS due to infectious process with acute organ dysfunction ([place])    Subjective: Still has pain left leg No fever No sob No cough Appetite fair Making good urine Had biopsy of the skin by derm  Medications:  . aspirin  81 mg Oral Daily  . calamine   Topical BID  . calcium-vitamin D  1 tablet Oral Daily  . enoxaparin (LOVENOX) injection  55 mg Subcutaneous Q24H  . gabapentin  400 mg Oral TID  . insulin aspart  0-20 Units Subcutaneous TID WC  . insulin aspart  0-5 Units Subcutaneous QHS  . insulin detemir  20 Units Subcutaneous Daily  . loratadine  10 mg Oral Daily  . multivitamin with minerals  1 tablet Oral Daily  . predniSONE  20 mg Oral Q1400  . [START ON 08/07/2020] predniSONE  40 mg Oral Q breakfast  . simvastatin  40 mg Oral Daily    Objective: Vital signs in last 24 hours: Temp:  [97.6 F (36.4 C)-98.2 F (36.8 C)] 97.6 F (36.4 C) (11/30 1611) Pulse Rate:  [91-110] 98 (11/30 1611) Resp:  [18] 18 (11/30 0443) BP: (140-164)/(82-88) 153/86 (11/30 1611) SpO2:  [96 %-100 %] 98 % (11/30 1611)  PHYSICAL EXAM:  General: Alert, cooperative,moom face, erythematous Head: Normocephalic, without obvious abnormality, atraumatic. Eyes: Conjunctivae clear, anicteric sclerae. Pupils are equal ENT Nares normal. No drainage or sinus tenderness. Lips, mucosa, and tongue normal. No Thrush Neck: Supple, symmetrical, no adenopathy, thyroid: non tender no carotid bruit and no JVD. Back: No CVA tenderness. Lungs: Clear to auscultation bilaterally. No Wheezing or Rhonchi. No rales. Heart:  Regular rate and rhythm, no murmur, rub or gallop. Abdomen: Soft, non-tender,not distended. Bowel sounds normal. No masses Extremities: purpuric rash left thigh, hip and leg        Skin: as above Lymph: Cervical, supraclavicular normal. Neurologic: Grossly non-focal  Lab Results Recent Labs    08/05/20 0440 08/06/20 0611  WBC 21.0* 16.8*  HGB 10.4* 9.5*  HCT 31.5* 28.3*  NA 137 135  K 4.8 4.5  CL 98 98  CO2 30 27  BUN 89* 71*  CREATININE 1.22 1.19   Liver Panel Recent Labs    08/04/20 0740 08/06/20 0611  PROT 6.0* 4.8*  ALBUMIN 2.7* 2.2*  AST 73* 80*  ALT 305* 242*  ALKPHOS 134* 106  BILITOT 0.6 0.9  BILIDIR <0.1  --   IBILI NOT CALCULATED  --      Assessment/Plan: 48 yr male with neohrotic syndrome due to FSGS since Sept 2021 on high dose prednisone and multiple diuretics presented with weakness, fatigue and fall Found to be dehydrated due to the diuretics Improvedwith fluids  Fall/syncope- likely had orthostasis Had SIRS like vitals but no evidence clinically of UTi, pneumonia or bacteremia- so no antibiotics needed  Dizziness/vertigo- r/o diuretic induced inner ear issues  Left leg- purpuric lesions- multidermatomal herpes zoster on  Valtrex 1 gram Q 8. Seen by [facility] and biopsies taken- as per their recommendation will change to IV acyclovir Pt is immunecompromised due to high dose steroids- has  been on prednisone 80mg  for 3 months, has been reduced to 60mg  today  Will need PCP prophylxis with Bactrim SS 1 a day  AKI on underlying nephrotic syndrome- due to prerenal- improved with IV fluids  Impaired glucose secondary to steroids  Discussed the management with patient and his wife

## 2020-08-06 NOTE — Progress Notes (Addendum)
New Patient Visit  Subjective  Russell Grant is a 49 y.o. male who presents for the following: Rash (hospital consult- painful spreading rash).  Chart was reviewed.  He has a h/o chronic kidney disease (glomerulonephritis) and was admitted 1 day ago with dehydration.  Currently being treated for nephrotic syndrome.  He has been taking high dose prednisone (80 mg) since mid August for his kidney disease.  Two months ago he had painful blisters come up in his L groin and spread around hip.  He was diagnosed with shingles and was treated with Valtrex 1 gm tid for 20 days, until blisters dried up and healed, but severe pain persisted.  He has been taking Gabapentin 300 mg PO tid for pain.  Couple weeks ago he had a new painful rash come up on his L thigh and leg and has continued to spread to his L buttock/groin.  He just started new course of Valtrex today along with calamine lotion.    The following portions of the chart were reviewed this encounter and updated as appropriate:       Review of Systems:  No other skin or systemic complaints except as noted in HPI or Assessment and Plan.  Objective  Well appearing patient in no apparent distress; mood and affect are within normal limits.  A comprehensive examination was performed including legs, buttocks, groin, back, face, arms. Relevant physical exam findings are noted in the Assessment and Plan.  Objective  L lower leg, groin, L thigh, Left Buttock, Left Medial Knee: Multiple violaceous macules and crusted vesicles, tender to touch.   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  Patient has vesicles outside of dermatome and is continuing to get new lesions.  Punch bx x 2 and viral culture performed today.   Wound care- cleanse daily, vaseline or antibiotic ointment daily and cover with bandaid.  Suture removal in 1 week.  60 minutes time for hospital patient visit and procedure, and 20 minutes for documentation.   1. Continue hospital airborne precautions, cover affected areas 2. Consider changing oral Valtrex to IV Acyclovir until no new lesions and all lesions crusted over- per ID recommendation 3. Continue pain management- may need to increase gabapentin and/or temporarily add opiod 4. Patient may benefit from antiviral prophylaxis if he continues to take high dose prednisone- per ID recommendation 5. Consider Shringrix vaccine when off prednisone- per ID recommendation  Skin / nail biopsy - Left Medial Knee Type of biopsy: punch   Informed consent: discussed and consent obtained   Patient was prepped and draped in usual sterile fashion: Area prepped with alcohol. Anesthesia: the lesion was anesthetized in a standard fashion   Anesthetic:  1% lidocaine w/ epinephrine 1-100,000 buffered w/ 8.4% NaHCO3 Punch size:  4 mm Suture size:  4-0 Suture type: nylon   Suture removal (days):  7 Hemostasis achieved with: suture and pressure   Outcome: patient tolerated procedure well   Post-procedure details: wound care instructions given   Post-procedure details comment:  Ointment and pressure dressing applied  Skin / nail biopsy - Left Buttock Type of biopsy: punch   Informed consent: discussed and consent obtained   Patient was prepped and draped in usual sterile fashion: Area prepped with alcohol. Anesthesia: the lesion was anesthetized in a  standard fashion   Anesthetic:  1% lidocaine w/ epinephrine 1-100,000 buffered w/ 8.4% NaHCO3 Punch size:  4 mm Suture size:  4-0 Suture type: nylon   Suture removal (days):  7 Hemostasis achieved with: suture and pressure   Outcome: patient tolerated procedure well   Post-procedure details: wound care instructions given   Post-procedure details  comment:  Ointment and pressure dressing applied  Specimen 1 - Surgical pathology Differential Diagnosis: Disseminated Herpes Zoster vs Vasculitis  Check Margins: No Multiple violaceous macules and crusted vesicles, tender to touch. Patient hospitalized. Please rush.  Specimen 2 - Surgical pathology Differential Diagnosis: Disseminated Herpes Zoster vs Vasculitis Check Margins: No Multiple violaceous macules and crusted vesicles, tender to touch. Patient hospitalized. Please rush.  Return for Suture removal 1 week.   Documentation: I have reviewed the above documentation for accuracy and completeness, and I agree with the above.  Brendolyn Patty MD

## 2020-08-06 NOTE — Progress Notes (Addendum)
[facility]  ROUNDING NOTE   Subjective:   Russell Grant was admitted to [facility] on 08/04/2020 for Dehydration [E86.0] Dizziness [R42] Diuretic overdose, accidental or unintentional, initial encounter [T50.2X1A] Leukocytosis, unspecified type [D72.829] Chronic kidney disease, unspecified CKD stage [N18.9]  Patient lying in bed, denies shortness of breath, nausea or vomiting.  He appears comfortable, in no acute distress today.   Objective:  Vital signs in last 24 hours:  Temp:  [97.8 F (36.6 C)-98.2 F (36.8 C)] 97.9 F (36.6 C) (11/30 1218) Pulse Rate:  [91-110] 106 (11/30 1218) Resp:  [18-19] 18 (11/30 0443) BP: (126-164)/(70-88) 140/88 (11/30 1218) SpO2:  [96 %-100 %] 100 % (11/30 1218)  Weight change:  Filed Weights   08/04/20 0101  Weight: 112 kg    Intake/Output: I/O last 3 completed shifts: In: 1101.2 [P.O.:960; I.V.:40.3; IV Piggyback:100.9] Out: 3400 [Urine:3400]       Intake/Output this shift:  Total I/O In: 60 [P.O.:60] Out: 375 [Urine:375]  Physical Exam: General:  Resting in bed, wife at bedside  Head:  Oral mucous membranes moist  Eyes:  Anicteric  Lungs:   Respiration symmetrical and unlabored, lungs clear  Heart:  Regular rate and rhythm  Abdomen:  Soft, nontender, nondistended  Extremities:  No bilateral lower extremity edema  Neurologic:  Awake, alert, oriented  Skin: Red purpuric lesions on left lower extremity from left lower back down        Basic Metabolic Panel: Recent Labs  Lab 08/04/20 0116 08/04/20 0740 08/05/20 0440 08/06/20 0611  NA 136  --  137 135  K 4.5  --  4.8 4.5  CL 90*  --  98 98  CO2 29  --  30 27  GLUCOSE 357*  --  178* 212*  BUN 107*  --  89* 71*  CREATININE 1.93* 2.07* 1.22 1.19  CALCIUM 8.7*  --  8.5* 8.3*  MG  --   --   --  2.6*  PHOS  --   --   --  3.7    Liver Function Tests: Recent Labs  Lab 08/04/20 0740 08/06/20 0611  AST 73* 80*  ALT 305* 242*  ALKPHOS 134* 106   BILITOT 0.6 0.9  PROT 6.0* 4.8*  ALBUMIN 2.7* 2.2*   No results for input(s): LIPASE, AMYLASE in the last 168 hours. No results for input(s): AMMONIA in the last 168 hours.  CBC: Recent Labs  Lab 08/04/20 0116 08/04/20 0740 08/05/20 0440 08/06/20 0611  WBC 23.6* 22.2* 21.0* 16.8*  NEUTROABS  --  18.9*  --   --   HGB 12.1* 11.2* 10.4* 9.5*  HCT 35.2* 34.0* 31.5* 28.3*  MCV 95.7 98.0 98.1 96.6  PLT 191 159 135* 124*    Cardiac Enzymes: Recent Labs  Lab 08/04/20 0740  CKTOTAL 14*    BNP: Invalid input(s): POCBNP  CBG: Recent Labs  Lab 08/05/20 1134 08/05/20 1611 08/05/20 2109 08/06/20 0735 08/06/20 1216  GLUCAP 168* 214* 296* 168* 293*    Microbiology: Results for orders placed or performed during the hospital encounter of 08/04/20  Resp Panel by RT-PCR (Flu A&B, Covid) Nasopharyngeal Swab     Status: None   Collection Time: 08/04/20  5:07 AM   Specimen: Nasopharyngeal Swab; Nasopharyngeal(NP) swabs in vial transport medium  Result Value Ref Range Status   SARS Coronavirus 2 by RT PCR NEGATIVE NEGATIVE Final    Comment: (NOTE) SARS-CoV-2 target nucleic acids are NOT DETECTED.  The SARS-CoV-2 RNA is generally detectable in  upper respiratory specimens during the acute phase of infection. The lowest concentration of SARS-CoV-2 viral copies this assay can detect is 138 copies/mL. A negative result does not preclude SARS-Cov-2 infection and should not be used as the sole basis for treatment or other patient management decisions. A negative result may occur with  improper specimen collection/handling, submission of specimen other than nasopharyngeal swab, presence of viral mutation(s) within the areas targeted by this assay, and inadequate number of viral copies(<138 copies/mL). A negative result must be combined with clinical observations, patient history, and epidemiological information. The expected result is Negative.  Fact Sheet for Patients:   EntrepreneurPulse.com.au  Fact Sheet for Healthcare Providers:  IncredibleEmployment.be  This test is no t yet approved or cleared by the [place] FDA and  has been authorized for detection and/or diagnosis of SARS-CoV-2 by FDA under an Emergency Use Authorization (EUA). This EUA will remain  in effect (meaning this test can be used) for the duration of the COVID-19 declaration under Section 564(b)(1) of the Act, 21 U.S.C.section 360bbb-3(b)(1), unless the authorization is terminated  or revoked sooner.       Influenza A by PCR NEGATIVE NEGATIVE Final   Influenza B by PCR NEGATIVE NEGATIVE Final    Comment: (NOTE) The Xpert Xpress SARS-CoV-2/FLU/RSV plus assay is intended as an aid in the diagnosis of influenza from Nasopharyngeal swab specimens and should not be used as a sole basis for treatment. Nasal washings and aspirates are unacceptable for Xpert Xpress SARS-CoV-2/FLU/RSV testing.  Fact Sheet for Patients: EntrepreneurPulse.com.au  Fact Sheet for Healthcare Providers: IncredibleEmployment.be  This test is not yet approved or cleared by the [place] FDA and has been authorized for detection and/or diagnosis of SARS-CoV-2 by FDA under an Emergency Use Authorization (EUA). This EUA will remain in effect (meaning this test can be used) for the duration of the COVID-19 declaration under Section 564(b)(1) of the Act, 21 U.S.C. section 360bbb-3(b)(1), unless the authorization is terminated or revoked.  Performed at [facility], [address] [place], [place] [address]   Culture, blood (routine x 2)     Status: None (Preliminary result)   Collection Time: 08/04/20  7:40 AM   Specimen: BLOOD  Result Value Ref Range Status   Specimen Description BLOOD LEFT ANTECUBITAL  Final   Special Requests   Final    BOTTLES DRAWN AEROBIC AND ANAEROBIC Blood Culture results may not be optimal  due to an excessive volume of blood received in culture bottles   Culture   Final    NO GROWTH 2 DAYS Performed at [facility], [address], [place], [place] [address]    Report Status PENDING  Incomplete  Culture, blood (routine x 2)     Status: None (Preliminary result)   Collection Time: 08/04/20  2:40 PM   Specimen: BLOOD  Result Value Ref Range Status   Specimen Description BLOOD RIGHT ANTECUBITAL  Final   Special Requests   Final    BOTTLES DRAWN AEROBIC AND ANAEROBIC Blood Culture results may not be optimal due to an inadequate volume of blood received in culture bottles   Culture   Final    NO GROWTH 2 DAYS Performed at [facility], [address], [place], [place] [address]    Report Status PENDING  Incomplete    Coagulation Studies: Recent Labs    08/04/20 1032  LABPROT 12.2  INR 0.9    Urinalysis: Recent Labs    08/04/20 0507  COLORURINE YELLOW*  LABSPEC 1.015  PHURINE 5.0  GLUCOSEU 150*  HGBUR SMALL*  BILIRUBINUR NEGATIVE  KETONESUR NEGATIVE  PROTEINUR 100*  NITRITE NEGATIVE  LEUKOCYTESUR NEGATIVE      Imaging: No results found.   Medications:   . sodium chloride Stopped (08/05/20 6861)  . acyclovir    . [START ON 08/07/2020] acyclovir     . aspirin  81 mg Oral Daily  . calamine   Topical BID  . calcium-vitamin D  1 tablet Oral Daily  . enoxaparin (LOVENOX) injection  55 mg Subcutaneous Q24H  . gabapentin  400 mg Oral TID  . insulin aspart  0-20 Units Subcutaneous TID WC  . insulin aspart  0-5 Units Subcutaneous QHS  . insulin detemir  20 Units Subcutaneous Daily  . loratadine  10 mg Oral Daily  . multivitamin with minerals  1 tablet Oral Daily  . predniSONE  20 mg Oral Q1400  . [START ON 08/07/2020] predniSONE  40 mg Oral Q breakfast  . simvastatin  40 mg Oral Daily   sodium chloride, acetaminophen **OR** acetaminophen, ondansetron **OR** ondansetron (ZOFRAN) IV  Assessment/ Plan:  [name] is a 49  y.o. 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 Currently on high dose steroids.   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  Along with relative hyponatremia and metabolic alkalosis, suspect overdiuresis. 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  I agree with the findings and plan as documented in the note.    Murlean Iba , MD [facility] 11/30/20215:23 PM

## 2020-08-06 NOTE — Consult Note (Addendum)
Pharmacy Antibiotic Note  Russell Grant is a 49 y.o. male admitted on 08/04/2020 with herpes zoster/shingles.  Pharmacy has been consulted for acyclovir dosing.  Plan: Acyclovir 10mg /kg q8h starting at 1400 (already received am dose po Valacyclovir)  Height: 5\' 10"  (177.8 cm) Weight: 112 kg (247 lb) IBW/kg (Calculated) : 73  Temp (24hrs), Avg:98 F (36.7 C), Min:97.8 F (36.6 C), Max:98.2 F (36.8 C)  Recent Labs  Lab 08/04/20 0116 08/04/20 0740 08/04/20 1032 08/04/20 1440 08/05/20 0440 08/06/20 0611  WBC 23.6* 22.2*  --   --  21.0* 16.8*  CREATININE 1.93* 2.07*  --   --  1.22 1.19  LATICACIDVEN  --  3.1* 3.4* 3.1*  --   --     Estimated Creatinine Clearance: 93.1 mL/min (by C-G formula based on SCr of 1.19 mg/dL).    Allergies  Allergen Reactions  . Hydrochlorothiazide Rash  . Cefdinir Rash    Antimicrobials this admission: Vancomycin 11/28 x 1 Meropenem 11/28>11/29 Valacyclovir 11/29 >> 11/30 Acyclovir  Dose adjustments this admission: N/A  Microbiology results: 11/29 BCx: [id number]   Thank you for allowing pharmacy to be a part of this patient's care.  Lu Duffel, PharmD, BCPS Clinical Pharmacist 08/06/2020 9:31 AM  Addendum - dose adjusted to adjusted body weight vs total body weight after first 2 doses made - will start with new dose 850mg  q8h 12/1 am

## 2020-08-06 NOTE — TOC Initial Note (Signed)
Transition of Care [facility]) - Initial/Assessment Note    Patient Details  Name: Russell Grant MRN: [id number] Date of Birth: 11-11-1970  Transition of Care [facility]) CM/SW Contact:    Beverly Sessions, RN Phone Number: 08/06/2020, 10:32 AM  Clinical Narrative:                 Patient with high risk for readmission score Due to isolation precautions assessment completed via phone  Patient states that he lives at home with wife  PCP [place]-  Denies issues obtaining medications  Patient states that he is independent of all ADL's.  Was working full time up until 07/29/20  No anticipated needs for discharge.  Please consult if indicated   Expected Discharge Plan: Home/Self Care Barriers to Discharge: Continued Medical Work up   Patient Goals and CMS Choice        Expected Discharge Plan and Services Expected Discharge Plan: Home/Self Care       Living arrangements for the past 2 months: Single Family Home                                      Prior Living Arrangements/Services Living arrangements for the past 2 months: Single Family Home Lives with:: Spouse Patient language and need for interpreter reviewed:: Yes Do you feel safe going back to the place where you live?: Yes      Need for Family Participation in Patient Care: No (Comment) Care giver support system in place?: Yes (comment)   Criminal Activity/Legal Involvement Pertinent to Current Situation/Hospitalization: No - Comment as needed  Activities of Daily Living Home Assistive Devices/Equipment: None ADL Screening (condition at time of admission) Patient's cognitive ability adequate to safely complete daily activities?: Yes Is the patient deaf or have difficulty hearing?: No Does the patient have difficulty seeing, even when wearing glasses/contacts?: Yes (wears glasses) Does the patient have difficulty concentrating, remembering, or making decisions?: No Patient able to express  need for assistance with ADLs?: Yes Does the patient have difficulty dressing or bathing?: No Independently performs ADLs?: Yes (appropriate for developmental age) Does the patient have difficulty walking or climbing stairs?: No Weakness of Legs: Both Weakness of Arms/Hands: Both  Permission Sought/Granted                  Emotional Assessment       Orientation: : Oriented to Self, Oriented to Place, Oriented to  Time, Oriented to Situation   Psych Involvement: No (comment)  Admission diagnosis:  Dehydration [E86.0] Dizziness [R42] Diuretic overdose, accidental or unintentional, initial encounter [T50.2X1A] Leukocytosis, unspecified type [D72.829] Chronic kidney disease, unspecified CKD stage [N18.9] Patient Active Problem List   Diagnosis Date Noted  . Generalized weakness 08/04/2020  . Dehydration 08/04/2020  . New onset type 2 diabetes mellitus ([place]) 08/04/2020  . SIRS due to infectious process with acute organ dysfunction ([place]) 08/04/2020  . FSGS (focal segmental glomerulosclerosis), tip variant with nephrosis   . Nephrotic syndrome 05/09/2020  . AKI (acute kidney injury) ([place]) 05/07/2020  . Anasarca 05/07/2020  . CKD (chronic kidney disease) stage 3, GFR 30-59 ml/min (HCC) 03/30/2019  . Hyperuricemia 03/30/2019  . History of gout 03/30/2019  . IFG (impaired fasting glucose) 03/30/2019  . Personal history of other malignant neoplasm of skin 09/10/2017  . SCC (squamous cell carcinoma), hand 08/24/2017  . Class 2 severe obesity due to excess calories with serious  comorbidity and body mass index (BMI) of 38.0 to 38.9 in adult [facility]) 08/24/2017  . Hypertriglyceridemia   . Essential hypertension    PCP:  Sofie Hartigan, MD Pharmacy:   [id number] [place], [address] [place] [address] [address] Phone: [phone] Fax: [phone]     Social Determinants of Health (SDOH) Interventions    Readmission Risk  Interventions Readmission Risk Prevention Plan 08/06/2020  Transportation Screening Complete  HRI or [place] (No Data)  Palliative Care Screening Not Applicable  Medication Review (RN Care Manager) Complete  Some recent data might be hidden

## 2020-08-07 LAB — COMPREHENSIVE METABOLIC PANEL
ALT: 222 U/L — ABNORMAL HIGH (ref 0–44)
AST: 65 U/L — ABNORMAL HIGH (ref 15–41)
Albumin: 2.3 g/dL — ABNORMAL LOW (ref 3.5–5.0)
Alkaline Phosphatase: 100 U/L (ref 38–126)
Anion gap: 10 (ref 5–15)
BUN: 58 mg/dL — ABNORMAL HIGH (ref 6–20)
CO2: 28 mmol/L (ref 22–32)
Calcium: 8.1 mg/dL — ABNORMAL LOW (ref 8.9–10.3)
Chloride: 98 mmol/L (ref 98–111)
Creatinine, Ser: 1 mg/dL (ref 0.61–1.24)
GFR, Estimated: >60 mL/min (ref >60)
Glucose, Bld: 128 mg/dL — ABNORMAL HIGH (ref 70–99)
Potassium: 4.6 mmol/L (ref 3.5–5.1)
Sodium: 136 mmol/L (ref 135–145)
Total Bilirubin: 0.9 mg/dL (ref 0.3–1.2)
Total Protein: 5.1 g/dL — ABNORMAL LOW (ref 6.5–8.1)

## 2020-08-07 LAB — CBC
HCT: 29.8 % — ABNORMAL LOW (ref 39.0–52.0)
Hemoglobin: 10.1 g/dL — ABNORMAL LOW (ref 13.0–17.0)
MCH: 32.9 pg (ref 26.0–34.0)
MCHC: 33.9 g/dL (ref 30.0–36.0)
MCV: 97.1 fL (ref 80.0–100.0)
Platelets: 134 10*3/uL — ABNORMAL LOW (ref 150–400)
RBC: 3.07 MIL/uL — ABNORMAL LOW (ref 4.22–5.81)
RDW: 17.5 % — ABNORMAL HIGH (ref 11.5–15.5)
WBC: 17.3 10*3/uL — ABNORMAL HIGH (ref 4.0–10.5)
nRBC: 1.4 % — ABNORMAL HIGH (ref 0.0–0.2)

## 2020-08-07 LAB — PHOSPHORUS: Phosphorus: 3.1 mg/dL (ref 2.5–4.6)

## 2020-08-07 LAB — MAGNESIUM: Magnesium: 2.6 mg/dL — ABNORMAL HIGH (ref 1.7–2.4)

## 2020-08-07 LAB — GLUCOSE, CAPILLARY
Glucose-Capillary: 128 mg/dL — ABNORMAL HIGH (ref 70–99)
Glucose-Capillary: 214 mg/dL — ABNORMAL HIGH (ref 70–99)
Glucose-Capillary: 223 mg/dL — ABNORMAL HIGH (ref 70–99)

## 2020-08-07 MED ORDER — SULFAMETHOXAZOLE-TRIMETHOPRIM 400-80 MG PO TABS
1.0000 | ORAL_TABLET | Freq: Every day | ORAL | Status: DC
  Administered 2020-08-07 – 2020-08-11 (×5): 1 via ORAL
  Filled 2020-08-07 (×5): qty 1

## 2020-08-07 MED ORDER — GABAPENTIN 300 MG PO CAPS
600.0000 mg | ORAL_CAPSULE | Freq: Three times a day (TID) | ORAL | Status: DC
  Administered 2020-08-07 – 2020-08-11 (×12): 600 mg via ORAL
  Filled 2020-08-07 (×12): qty 2

## 2020-08-07 MED ORDER — SULFAMETHOXAZOLE-TRIMETHOPRIM 400-80 MG PO TABS
1.0000 | ORAL_TABLET | Freq: Two times a day (BID) | ORAL | Status: DC
  Filled 2020-08-07: qty 1

## 2020-08-07 NOTE — Progress Notes (Addendum)
[facility]  ROUNDING NOTE   Subjective:   [name] was admitted to [facility] on 08/04/2020 for Dehydration [E86.0] Dizziness [R42] Diuretic overdose, accidental or unintentional, initial encounter [T50.2X1A] Leukocytosis, unspecified type [D72.829] Chronic kidney disease, unspecified CKD stage [N18.9]  Patient  resting in bed, wife at bedside.  He complains of new rashes around knee and on his left foot which are very painful.     Objective:  Vital signs in last 24 hours:  Temp:  [97.5 F (36.4 C)-98 F (36.7 C)] 97.7 F (36.5 C) (12/01 1203) Pulse Rate:  [88-106] 106 (12/01 1203) Resp:  [16-18] 16 (12/01 1203) BP: (142-164)/(77-86) 156/79 (12/01 1203) SpO2:  [98 %-100 %] 99 % (12/01 1203)  Weight change:  Filed Weights   08/04/20 0101  Weight: 112 kg    Intake/Output: I/O last 3 completed shifts: In: 1165.2 [P.O.:1140; I.V.:25.2] Out: 2450 [Urine:2450]       Intake/Output this shift:  Total I/O In: 167 [IV Piggyback:167] Out: 200 [Urine:200]  Physical Exam: General:  Lying in bed, in no acute distress  Head:  Normocephalic, atraumatic  Eyes:  Sclerae and conjunctivae clear  Lungs:   Lungs clear to auscultation bilaterally  Heart:  S1S2 no rubs or gallops  Abdomen:  Soft, nontender, nondistended  Extremities:  No bilateral lower extremity edema  Neurologic:  Awake, alert, oriented  Skin: Red purpuric lesions on left lower extremity from left lower back down        Basic Metabolic Panel: Recent Labs  Lab 08/04/20 0116 08/04/20 0116 08/04/20 0740 08/05/20 0440 08/06/20 0611 08/07/20 0623  NA 136  --   --  137 135 136  K 4.5  --   --  4.8 4.5 4.6  CL 90*  --   --  98 98 98  CO2 29  --   --  30 27 28   GLUCOSE 357*  --   --  178* 212* 128*  BUN 107*  --   --  89* 71* 58*  CREATININE 1.93*  --  2.07* 1.22 1.19 1.00  CALCIUM 8.7*   < >  --  8.5* 8.3* 8.1*  MG  --   --   --   --  2.6* 2.6*  PHOS  --   --   --   --  3.7 3.1   <  > = values in this interval not displayed.    Liver Function Tests: Recent Labs  Lab 08/04/20 0740 08/06/20 0611 08/07/20 0623  AST 73* 80* 65*  ALT 305* 242* 222*  ALKPHOS 134* 106 100  BILITOT 0.6 0.9 0.9  PROT 6.0* 4.8* 5.1*  ALBUMIN 2.7* 2.2* 2.3*   No results for input(s): LIPASE, AMYLASE in the last 168 hours. No results for input(s): AMMONIA in the last 168 hours.  CBC: Recent Labs  Lab 08/04/20 0116 08/04/20 0740 08/05/20 0440 08/06/20 0611 08/07/20 0623  WBC 23.6* 22.2* 21.0* 16.8* 17.3*  NEUTROABS  --  18.9*  --   --   --   HGB 12.1* 11.2* 10.4* 9.5* 10.1*  HCT 35.2* 34.0* 31.5* 28.3* 29.8*  MCV 95.7 98.0 98.1 96.6 97.1  PLT 191 159 135* 124* 134*    Cardiac Enzymes: Recent Labs  Lab 08/04/20 0740  CKTOTAL 14*    BNP: Invalid input(s): POCBNP  CBG: Recent Labs  Lab 08/06/20 1216 08/06/20 1608 08/06/20 2116 08/07/20 0752 08/07/20 1205  GLUCAP 293* 249* 313* 128* 214*    Microbiology: Results for  orders placed or performed during the hospital encounter of 08/04/20  Resp Panel by RT-PCR (Flu A&B, Covid) Nasopharyngeal Swab     Status: None   Collection Time: 08/04/20  5:07 AM   Specimen: Nasopharyngeal Swab; Nasopharyngeal(NP) swabs in vial transport medium  Result Value Ref Range Status   SARS Coronavirus 2 by RT PCR NEGATIVE NEGATIVE Final    Comment: (NOTE) SARS-CoV-2 target nucleic acids are NOT DETECTED.  The SARS-CoV-2 RNA is generally detectable in upper respiratory specimens during the acute phase of infection. The lowest concentration of SARS-CoV-2 viral copies this assay can detect is 138 copies/mL. A negative result does not preclude SARS-Cov-2 infection and should not be used as the sole basis for treatment or other patient management decisions. A negative result may occur with  improper specimen collection/handling, submission of specimen other than nasopharyngeal swab, presence of viral mutation(s) within the areas  targeted by this assay, and inadequate number of viral copies(<138 copies/mL). A negative result must be combined with clinical observations, patient history, and epidemiological information. The expected result is Negative.  Fact Sheet for Patients:  EntrepreneurPulse.com.au  Fact Sheet for Healthcare Providers:  IncredibleEmployment.be  This test is no t yet approved or cleared by the [place] FDA and  has been authorized for detection and/or diagnosis of SARS-CoV-2 by FDA under an Emergency Use Authorization (EUA). This EUA will remain  in effect (meaning this test can be used) for the duration of the COVID-19 declaration under Section 564(b)(1) of the Act, 21 U.S.C.section 360bbb-3(b)(1), unless the authorization is terminated  or revoked sooner.       Influenza A by PCR NEGATIVE NEGATIVE Final   Influenza B by PCR NEGATIVE NEGATIVE Final    Comment: (NOTE) The Xpert Xpress SARS-CoV-2/FLU/RSV plus assay is intended as an aid in the diagnosis of influenza from Nasopharyngeal swab specimens and should not be used as a sole basis for treatment. Nasal washings and aspirates are unacceptable for Xpert Xpress SARS-CoV-2/FLU/RSV testing.  Fact Sheet for Patients: EntrepreneurPulse.com.au  Fact Sheet for Healthcare Providers: IncredibleEmployment.be  This test is not yet approved or cleared by the [place] FDA and has been authorized for detection and/or diagnosis of SARS-CoV-2 by FDA under an Emergency Use Authorization (EUA). This EUA will remain in effect (meaning this test can be used) for the duration of the COVID-19 declaration under Section 564(b)(1) of the Act, 21 U.S.C. section 360bbb-3(b)(1), unless the authorization is terminated or revoked.  Performed at [facility], [address] [place], [place] [address]   Culture, blood (routine x 2)     Status: None (Preliminary  result)   Collection Time: 08/04/20  7:40 AM   Specimen: BLOOD  Result Value Ref Range Status   Specimen Description BLOOD LEFT ANTECUBITAL  Final   Special Requests   Final    BOTTLES DRAWN AEROBIC AND ANAEROBIC Blood Culture results may not be optimal due to an excessive volume of blood received in culture bottles   Culture   Final    NO GROWTH 3 DAYS Performed at [facility], [address] [place], [place] [address]    Report Status PENDING  Incomplete  Culture, blood (routine x 2)     Status: None (Preliminary result)   Collection Time: 08/04/20  2:40 PM   Specimen: BLOOD  Result Value Ref Range Status   Specimen Description BLOOD RIGHT ANTECUBITAL  Final   Special Requests   Final    BOTTLES DRAWN AEROBIC AND ANAEROBIC Blood  Culture results may not be optimal due to an inadequate volume of blood received in culture bottles   Culture   Final    NO GROWTH 3 DAYS Performed at [facility], [address] [place], [place] [address]    Report Status PENDING  Incomplete    Coagulation Studies: No results for input(s): LABPROT, INR in the last 72 hours.  Urinalysis: No results for input(s): COLORURINE, LABSPEC, PHURINE, GLUCOSEU, HGBUR, BILIRUBINUR, KETONESUR, PROTEINUR, UROBILINOGEN, NITRITE, LEUKOCYTESUR in the last 72 hours.  Invalid input(s): APPERANCEUR    Imaging: No results found.   Medications:   . sodium chloride Stopped (08/06/20 1812)  . acyclovir 850 mg (08/07/20 1442)   . aspirin  81 mg Oral Daily  . calcium-vitamin D  1 tablet Oral Daily  . enoxaparin (LOVENOX) injection  55 mg Subcutaneous Q24H  . gabapentin  600 mg Oral TID  . insulin aspart  0-20 Units Subcutaneous TID WC  . insulin aspart  0-5 Units Subcutaneous QHS  . insulin detemir  20 Units Subcutaneous Daily  . loratadine  10 mg Oral Daily  . multivitamin with minerals  1 tablet Oral Daily  . predniSONE  20 mg Oral Q1400  . predniSONE  40 mg Oral Q breakfast  .  simvastatin  40 mg Oral Daily  . sulfamethoxazole-trimethoprim  1 tablet Oral Daily   sodium chloride, acetaminophen **OR** acetaminophen, ondansetron **OR** ondansetron (ZOFRAN) IV, witch hazel-glycerin  Assessment/ Plan:  [name] is a 49 y.o. 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 Currently on high dose steroids.   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  Along with relative hyponatremia and metabolic alkalosis, suspect overdiuresis. Lab Results  Component Value Date   CREATININE 1.00 08/07/2020   CREATININE 1.19 08/06/2020   CREATININE 1.22 08/05/2020  Renal function improving Urine output 1350 mL for the preceding 24 hours We will continue monitoring renal function Prednisone tapered to lower dose yesterday  # Left lower extremity rashes with nerve pain/ multidermatomal herpes zoster infection Patient reports pain and new rashes on his knee and left foot He is on iv acyclovir Management per ID team  #Steroid induced hyperglycemia Lab Results  Component Value Date   HGBA1C 7.1 (H) 08/06/2020   Blood glucose readings within acceptable range today We will continue current antihyperglycemic management PCP prophylaxis with Bactrim   LOS: 3 Princy Raju NP 12/1/20212:54 PM  12/1/20212:54 PM   I saw and evaluated the patient and discussed the care with Crosby Oyster, DNP.  I agree with the findings and plan as documented in the note.    Murlean Iba , MD [facility] 12/1/20219:02 PM

## 2020-08-07 NOTE — Evaluation (Signed)
Physical Therapy Evaluation Patient Details Name: Russell Grant MRN: [id number] DOB: 01/27/1971 Today's Date: 08/07/2020   History of Present Illness   49 y.o. male with medical history significant for hypertension, dyslipidemia, history of focal segmental glomerulosclerosis, stage III chronic kidney disease presented to ED with dizziness, weakness for several weeks with recent multiple falls (likely due to medication).  Pt in isolation room 2/2 shingles.  Clinical Impression  Pt did well with PT exam and showed good confidence and safety with activity.  He did have elevated HR, 120s on initial standing, but it stayed in the 120s with "prolonged" in-room ambulation and overall felt good with activity.  He reports he feels good about mobility and ability to safely go home, reports all recent falls were medication associated and that he has no concerns about being able to transition back home safely.      Follow Up Recommendations No PT follow up    Equipment Recommendations  None recommended by PT    Recommendations for Other Services       Precautions / Restrictions Precautions Precautions: None Restrictions Weight Bearing Restrictions: No      Mobility  Bed Mobility Overal bed mobility: Independent                  Transfers Overall transfer level: Independent Equipment used: None                Ambulation/Gait Ambulation/Gait assistance: Independent Gait Distance (Feet): 125 Feet Assistive device: None       General Gait Details: multiple in-room loops (isolation) with no LOBs, no AD, no unsteadiness.  Pt's HR was 120s on standing, but even after "prolonged" walk his HR remained in the 120s and O2 (on room air) was in the high 90s.  Stairs            Wheelchair Mobility    Modified Rankin (Stroke Patients Only)       Balance Overall balance assessment: Independent                                           Pertinent  Vitals/Pain Pain Assessment: 0-10 Pain Score: 4  Pain Location: shingles pain, mostly on L hip/flank    Home Living Family/patient expects to be discharged to:: Private residence Living Arrangements: Spouse/significant other Available Help at Discharge: Available 24 hours/day   Home Access: Stairs to enter Entrance Stairs-Rails:  (yes) Entrance Stairs-Number of Steps: 5 Home Layout: One level Home Equipment: None      Prior Function Level of Independence: Independent         Comments: Pt drives, works, able to be active     Journalist, newspaper        Extremity/Trunk Assessment   Upper Extremity Assessment Upper Extremity Assessment: Overall WFL for tasks assessed    Lower Extremity Assessment Lower Extremity Assessment: Overall WFL for tasks assessed       Communication   Communication: No difficulties  Cognition Arousal/Alertness: Awake/alert Behavior During Therapy: WFL for tasks assessed/performed Overall Cognitive Status: Within Functional Limits for tasks assessed                                        General Comments General comments (skin integrity, edema, etc.): Pt is able to move  well w/o hesitation and good overall safety/confidence.      Exercises     Assessment/Plan    PT Assessment Patent does not need any further PT services  PT Problem List Decreased strength;Decreased range of motion;Decreased activity tolerance;Decreased balance;Decreased mobility;Decreased coordination;Decreased knowledge of use of DME;Decreased safety awareness;Pain;Cardiopulmonary status limiting activity       PT Treatment Interventions      PT Goals (Current goals can be found in the Care Plan section)  Acute Rehab PT Goals Patient Stated Goal: go home PT Goal Formulation: All assessment and education complete, DC therapy    Frequency     Barriers to discharge        Co-evaluation               AM-PAC PT "6 Clicks" Mobility  Outcome  Measure Help needed turning from your back to your side while in a flat bed without using bedrails?: None Help needed moving from lying on your back to sitting on the side of a flat bed without using bedrails?: None Help needed moving to and from a bed to a chair (including a wheelchair)?: None Help needed standing up from a chair using your arms (e.g., wheelchair or bedside chair)?: None Help needed to walk in hospital room?: None Help needed climbing 3-5 steps with a railing? : None 6 Click Score: 24    End of Session   Activity Tolerance: Patient tolerated treatment well Patient left: in chair;with family/visitor present;with call bell/phone within reach Nurse Communication: Mobility status PT Visit Diagnosis: Muscle weakness (generalized) (M62.81);Difficulty in walking, not elsewhere classified (R26.2)    Time: 1740-8144 PT Time Calculation (min) (ACUTE ONLY): 27 min   Charges:   PT Evaluation $PT Eval Low Complexity: 1 Low          Kreg Shropshire, DPT 08/07/2020, 2:29 PM

## 2020-08-07 NOTE — Progress Notes (Signed)
PROGRESS NOTE  [name] SAY:[id number] DOB: 11-Jun-1971 DOA: 08/04/2020 PCP: Sofie Hartigan, MD   LOS: 3 days   Brief narrative: As per HPI,  Russell Grant is a 49 y.o. male with medical history significant for hypertension, dyslipidemia, history of focal segmental glomerulosclerosis, stage III chronic kidney disease presented to ED with dizziness, weakness for several weeks with recent multiple falls.   Patient was started on metolazone on 07/24/20 by his nephrologist and 1 week later (11/24) he was advised to hold the metolazone since his peripheral edema had improved significantly and patient had complained of multiple falls at home.  He was also advised to reduce his torsemide to 40 mg daily and to continue spironolactone.  He complained of dizziness, lightheadedness and dry mouth with increased frequency of urination.  In the ED, patient was noted to have elevated creatinine of 1.9 compared to baseline of 1.3 from 2 months back.  WBC was elevated at 22,000.    Respiratory viral panel was negative including Covid.  Chest x-ray without any acute findings.  Urinalysis showed some protein and hemoglobin in urine.  Patient was then admitted to hospital for worsening renal function and left lower extremity painful rash.  Nephrology was consulted and patient was admitted to the hospital.  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  Diuretics on hold.  Improved creatinine levels at this time.. CK  levels were within normal limits.  On high-dose prednisone as outpatient for nephrotic syndrome.  Nephrology on board.  Spoke with Dr. Candiss Norse about the future plan.  Lab Results  Component Value Date   CREATININE 1.00 08/07/2020   CREATININE 1.19 08/06/2020   CREATININE 1.22 08/05/2020    Hyperglycemia with new onset diabetes mellitus type 2.  Could be steroid-induced.  Currently on long-acting insulin and sliding scale insulin since patient is on a steroid.  Patient will benefit from possibly long-acting insulin plus oral hypoglycemics on discharge if he will stay on steroids..  Disseminated herpes zoster.  In the context of steroids.  Multidermatomal and seems to have progressed with pain..Dermatology has seen the patient and had punch biopsy. 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  On IV acyclovir.  Continue Neurontin.  ID recommending PCP prophylaxis, might need suppressive antivirals on discharge.  Stage III chronic kidney disease/FSGS/nephrotic syndrome On p.o. prednisone.  Nephrology on board.  SIRS on presentation with lactic acidosis. No evidence of infection.  Not on antibiotic.  WBC elevated due to steroids.    Generalized weakness with frequent falls Physical therapy will be consulted  Obesity (BMI 35) With underlying focal segmental glomerulonephritis. Would benefit from ongoing weight loss.   Dyslipidemia Continue statins for now   DVT prophylaxis: SCDs Start: 08/04/20 0555, subcu Lovenox   Code Status: Full code  Family Communication: I again spoke with the patient's wife at bedside today about the clinical condition of the patient.  Status is: Inpatient  Remains inpatient appropriate because:IV treatments appropriate due to intensity of illness or inability  to take PO, Inpatient level of care appropriate due to severity of illness, disseminated herpes zoster on IV acyclovir  Dispo: The patient is from:  Home              Anticipated d/c is to: Home              Anticipated d/c date is: 2 to 3 days.  As per ID and nephrology recommendation.              Patient currently is not medically stable to d/c.   Consultants:  Nephrology  Infectious disease  Dermatology  Procedures:  Punch biopsy of skin lesion on 08/05/2020 by dermatology Dr. Nicole Kindred  Antibiotics:  . Vancomycin, meropenem 11/28> discontinued . Valtrex > 08/05/2020-11/30 . IV acyclovir 11/30>  Subjective: Today, patient was seen and examined at bedside.  Complains increasing pain over the left leg especially over the kneecap region with increased lesions on the lower extremity.  Denies shortness of breath, cough, fever, chest pain  Objective: Vitals:   08/07/20 0356 08/07/20 0756  BP: (!) 153/84 (!) 142/77  Pulse: 88 88  Resp:  18  Temp: (!) 97.5 F (36.4 C) 97.9 F (36.6 C)  SpO2: 99% 100%    Intake/Output Summary (Last 24 hours) at 08/07/2020 0900 Last data filed at 08/07/2020 0422 Gross per 24 hour  Intake 685.16 ml  Output 1175 ml  Net -489.84 ml   Filed Weights   08/04/20 0101  Weight: 112 kg   Body mass index is 35.44 kg/m.   Physical Exam:  GENERAL: Patient is alert awake and oriented. Not in obvious distress.  Obese HENT: No scleral pallor or icterus. Pupils equally reactive to light. Oral mucosa is moist NECK: is supple, no gross swelling noted. CHEST: Clear to auscultation. No crackles or wheezes.  Diminished breath sounds bilaterally. CVS: S1 and S2 heard, no murmur. Regular rate and rhythm.  ABDOMEN: Soft, non-tender, bowel sounds are present. EXTREMITIES: No edema.  Left lower extremity area with erythematous lesions mostly over the thigh and leg.  Also scattered over the groin and foot..  Some erythematous lesions over the wrists/hands. CNS: Cranial nerves are intact. No focal motor deficits. SKIN: warm and dry .  Herpetic zoster lesions over the left lower extremity, including leg  thigh, buttocks area, groin..  Data Review: I have personally reviewed the following laboratory data and studies,  CBC: Recent Labs  Lab 08/04/20 0116 08/04/20 0740 08/05/20 0440 08/06/20 0611 08/07/20 0623  WBC 23.6* 22.2* 21.0* 16.8* 17.3*  NEUTROABS  --  18.9*  --   --   --   HGB 12.1* 11.2* 10.4* 9.5* 10.1*  HCT 35.2* 34.0* 31.5* 28.3* 29.8*  MCV 95.7 98.0 98.1 96.6 97.1  PLT 191 159 135* 124* 443*   Basic Metabolic Panel: Recent Labs  Lab 08/04/20 0116 08/04/20 0740 08/05/20 0440 08/06/20 0611 08/07/20 0623  NA 136  --  137 135 136  K 4.5  --  4.8 4.5 4.6  CL 90*  --  98 98 98  CO2 29  --  30 27 28   GLUCOSE 357*  --  178* 212* 128*  BUN 107*  --  89* 71* 58*  CREATININE 1.93* 2.07* 1.22 1.19 1.00  CALCIUM 8.7*  --  8.5* 8.3* 8.1*  MG  --   --   --  2.6* 2.6*  PHOS  --   --   --  3.7 3.1   Liver Function Tests: Recent Labs  Lab 08/04/20 0740 08/06/20 0611 08/07/20 0623  AST 73* 80* 65*  ALT 305* 242* 222*  ALKPHOS 134* 106 100  BILITOT 0.6 0.9 0.9  PROT 6.0* 4.8* 5.1*  ALBUMIN 2.7* 2.2* 2.3*   No results for input(s): LIPASE, AMYLASE in the last 168 hours. No results for input(s): AMMONIA in the last 168 hours. Cardiac Enzymes: Recent Labs  Lab 08/04/20 0740  CKTOTAL 14*   BNP (last 3 results) Recent Labs    04/07/20 0116 04/10/20 1850 05/07/20 0759  BNP 78.5 57.1 11.0    ProBNP (last 3 results) No results for input(s): PROBNP in the last 8760 hours.  CBG: Recent Labs  Lab 08/06/20 0735 08/06/20 1216 08/06/20 1608 08/06/20 2116 08/07/20 0752  GLUCAP 168* 293* 249* 313* 128*   Recent Results (from the past 240 hour(s))  Resp Panel by RT-PCR (Flu A&B, Covid) Nasopharyngeal Swab     Status: None   Collection Time: 08/04/20  5:07 AM   Specimen: Nasopharyngeal Swab; Nasopharyngeal(NP) swabs in vial transport medium  Result Value Ref Range Status   SARS Coronavirus 2 by RT PCR NEGATIVE NEGATIVE Final    Comment: (NOTE) SARS-CoV-2  target nucleic acids are NOT DETECTED.  The SARS-CoV-2 RNA is generally detectable in upper respiratory specimens during the acute phase of infection. The lowest concentration of SARS-CoV-2 viral copies this assay can detect is 138 copies/mL. A negative result does not preclude SARS-Cov-2 infection and should not be used as the sole basis for treatment or other patient management decisions. A negative result may occur with  improper specimen collection/handling, submission of specimen other than nasopharyngeal swab, presence of viral mutation(s) within the areas targeted by this assay, and inadequate number of viral copies(<138 copies/mL). A negative result must be combined with clinical observations, patient history, and epidemiological information. The expected result is Negative.  Fact Sheet for Patients:  EntrepreneurPulse.com.au  Fact Sheet for Healthcare Providers:  IncredibleEmployment.be  This test is no t yet approved or cleared by the [place] FDA and  has been authorized for detection and/or diagnosis of SARS-CoV-2 by FDA under an Emergency Use Authorization (EUA). This EUA will remain  in effect (meaning this test can be used) for the duration of the COVID-19 declaration under Section 564(b)(1) of the Act, 21 U.S.C.section 360bbb-3(b)(1), unless the authorization is terminated  or revoked sooner.       Influenza A by PCR NEGATIVE NEGATIVE Final   Influenza B by PCR NEGATIVE NEGATIVE Final    Comment: (NOTE) The Xpert Xpress SARS-CoV-2/FLU/RSV plus assay is intended as an aid in the diagnosis of influenza from Nasopharyngeal swab specimens and should not be used as a sole basis for treatment. Nasal washings and aspirates are unacceptable for Xpert Xpress SARS-CoV-2/FLU/RSV testing.  Fact Sheet for Patients: EntrepreneurPulse.com.au  Fact Sheet for Healthcare  Providers: IncredibleEmployment.be  This test is not yet approved or cleared by the [place] FDA and has been authorized for detection and/or diagnosis of SARS-CoV-2 by FDA under an Emergency Use Authorization (EUA). This EUA will remain in effect (meaning this test can be used) for the duration of the COVID-19 declaration under Section 564(b)(1) of the Act, 21 U.S.C. section 360bbb-3(b)(1), unless the authorization is terminated or revoked.  Performed at [facility], [address] [place], [place] [address]   Culture, blood (routine x 2)     Status: None (Preliminary result)   Collection Time: 08/04/20  7:40 AM   Specimen: BLOOD  Result Value Ref Range Status  Specimen Description BLOOD LEFT ANTECUBITAL  Final   Special Requests   Final    BOTTLES DRAWN AEROBIC AND ANAEROBIC Blood Culture results may not be optimal due to an excessive volume of blood received in culture bottles   Culture   Final    NO GROWTH 3 DAYS Performed at [facility], [address] [place], [place] [address]    Report Status PENDING  Incomplete  Culture, blood (routine x 2)     Status: None (Preliminary result)   Collection Time: 08/04/20  2:40 PM   Specimen: BLOOD  Result Value Ref Range Status   Specimen Description BLOOD RIGHT ANTECUBITAL  Final   Special Requests   Final    BOTTLES DRAWN AEROBIC AND ANAEROBIC Blood Culture results may not be optimal due to an inadequate volume of blood received in culture bottles   Culture   Final    NO GROWTH 3 DAYS Performed at [facility], [address], [place], [place] [address]    Report Status PENDING  Incomplete     Studies: No results found.    Flora Lipps, MD  [facility] 08/07/2020

## 2020-08-07 NOTE — Progress Notes (Signed)
Date of Admission:  08/04/2020     ID: Russell Grant is a 49 y.o. male Principal Problem:   AKI (acute kidney injury) ([place]) Active Problems:   Essential hypertension   CKD (chronic kidney disease) stage 3, GFR 30-59 ml/min (HCC)   FSGS (focal segmental glomerulosclerosis), tip variant with nephrosis   Generalized weakness   Dehydration   New onset type 2 diabetes mellitus ([place])   SIRS due to infectious process with acute organ dysfunction (HCC)    Subjective: Still has pain left leg, says there are new lesions cropping up  No fever No sob No cough Appetite fair Making good urine Had biopsy of the skin by derm  Medications:  . aspirin  81 mg Oral Daily  . calcium-vitamin D  1 tablet Oral Daily  . enoxaparin (LOVENOX) injection  55 mg Subcutaneous Q24H  . gabapentin  600 mg Oral TID  . insulin aspart  0-20 Units Subcutaneous TID WC  . insulin aspart  0-5 Units Subcutaneous QHS  . insulin detemir  20 Units Subcutaneous Daily  . loratadine  10 mg Oral Daily  . multivitamin with minerals  1 tablet Oral Daily  . predniSONE  20 mg Oral Q1400  . predniSONE  40 mg Oral Q breakfast  . simvastatin  40 mg Oral Daily  . sulfamethoxazole-trimethoprim  1 tablet Oral Daily    Objective: Vital signs in last 24 hours: Temp:  [97.5 F (36.4 C)-98 F (36.7 C)] 97.7 F (36.5 C) (12/01 1203) Pulse Rate:  [88-106] 106 (12/01 1203) Resp:  [16-18] 16 (12/01 1203) BP: (142-164)/(77-86) 156/79 (12/01 1203) SpO2:  [98 %-100 %] 99 % (12/01 1203)  PHYSICAL EXAM:  General: Alert, cooperative, Head: Normocephalic, without obvious abnormality, atraumatic. Lungs: Clear to auscultation bilaterally. No Wheezing or Rhonchi. No rales. Heart: Regular rate and rhythm, no murmur, rub or gallop. Abdomen: Soft, non-tender,not distended. Bowel sounds normal. No masses Extremities: purpuric rash left thigh, hip and leg        Skin: as above Lymph: Cervical, supraclavicular  normal. Neurologic: Grossly non-focal  Lab Results Recent Labs    08/06/20 0611 08/07/20 0623  WBC 16.8* 17.3*  HGB 9.5* 10.1*  HCT 28.3* 29.8*  NA 135 136  K 4.5 4.6  CL 98 98  CO2 27 28  BUN 71* 58*  CREATININE 1.19 1.00   Liver Panel Recent Labs    08/06/20 0611 08/07/20 0623  PROT 4.8* 5.1*  ALBUMIN 2.2* 2.3*  AST 80* 65*  ALT 242* 222*  ALKPHOS 106 100  BILITOT 0.9 0.9     Assessment/Plan: 49 yr male with neohrotic syndrome due to FSGS since Sept 2021 on high dose prednisone and multiple diuretics presented with weakness, fatigue and fall Found to be dehydrated due to the diuretics Improvedwith fluids  Fall/syncope- likely had orthostasis Had SIRS like vitals but no evidence clinically of UTi, pneumonia or bacteremia- so no antibiotics needed  Left leg- purpuric lesions- multidermatomal herpes zoster on  Valtrex 1 gram Q 8. Seen by Payton Mccallum and biopsies taken- as per their recommendation changed to IV acyclovir Pt says he is getting new lesions- will call Derm to find out about the pathology Pt is immunecompromised due to high dose steroids- has been on prednisone 80mg  for 3 months, has been reduced to 60mg  today  Will need PCP prophylxis with Bactrim SS 1 a day  AKI on underlying nephrotic syndrome- due to prerenal- improved with IV fluids  Impaired glucose secondary to steroids  Discussed the management with patient and his wife

## 2020-08-08 DIAGNOSIS — D631 Anemia in chronic kidney disease: Secondary | ICD-10-CM

## 2020-08-08 DIAGNOSIS — I129 Hypertensive chronic kidney disease with stage 1 through stage 4 chronic kidney disease, or unspecified chronic kidney disease: Secondary | ICD-10-CM

## 2020-08-08 DIAGNOSIS — B029 Zoster without complications: Secondary | ICD-10-CM

## 2020-08-08 DIAGNOSIS — R6511 Systemic inflammatory response syndrome (SIRS) of non-infectious origin with acute organ dysfunction: Secondary | ICD-10-CM

## 2020-08-08 DIAGNOSIS — D696 Thrombocytopenia, unspecified: Secondary | ICD-10-CM

## 2020-08-08 LAB — GLUCOSE, CAPILLARY
Glucose-Capillary: 310 mg/dL — ABNORMAL HIGH (ref 70–99)
Glucose-Capillary: 178 mg/dL — ABNORMAL HIGH (ref 70–99)
Glucose-Capillary: 288 mg/dL — ABNORMAL HIGH (ref 70–99)
Glucose-Capillary: 256 mg/dL — ABNORMAL HIGH (ref 70–99)
Glucose-Capillary: 236 mg/dL — ABNORMAL HIGH (ref 70–99)

## 2020-08-08 LAB — CBC
HCT: 27.5 % — ABNORMAL LOW (ref 39.0–52.0)
Hemoglobin: 9.5 g/dL — ABNORMAL LOW (ref 13.0–17.0)
MCH: 33.3 pg (ref 26.0–34.0)
MCHC: 34.5 g/dL (ref 30.0–36.0)
MCV: 96.5 fL (ref 80.0–100.0)
Platelets: 124 10*3/uL — ABNORMAL LOW (ref 150–400)
RBC: 2.85 MIL/uL — ABNORMAL LOW (ref 4.22–5.81)
RDW: 17.6 % — ABNORMAL HIGH (ref 11.5–15.5)
WBC: 15.2 10*3/uL — ABNORMAL HIGH (ref 4.0–10.5)
nRBC: 1.6 % — ABNORMAL HIGH (ref 0.0–0.2)

## 2020-08-08 LAB — COMPREHENSIVE METABOLIC PANEL
ALT: 177 U/L — ABNORMAL HIGH (ref 0–44)
AST: 51 U/L — ABNORMAL HIGH (ref 15–41)
Albumin: 2.1 g/dL — ABNORMAL LOW (ref 3.5–5.0)
Alkaline Phosphatase: 99 U/L (ref 38–126)
Anion gap: 8 (ref 5–15)
BUN: 44 mg/dL — ABNORMAL HIGH (ref 6–20)
CO2: 26 mmol/L (ref 22–32)
Calcium: 7.7 mg/dL — ABNORMAL LOW (ref 8.9–10.3)
Chloride: 100 mmol/L (ref 98–111)
Creatinine, Ser: 0.89 mg/dL (ref 0.61–1.24)
GFR, Estimated: >60 mL/min (ref >60)
Glucose, Bld: 127 mg/dL — ABNORMAL HIGH (ref 70–99)
Potassium: 4.4 mmol/L (ref 3.5–5.1)
Sodium: 134 mmol/L — ABNORMAL LOW (ref 135–145)
Total Bilirubin: 0.8 mg/dL (ref 0.3–1.2)
Total Protein: 4.7 g/dL — ABNORMAL LOW (ref 6.5–8.1)

## 2020-08-08 LAB — MAGNESIUM: Magnesium: 2.6 mg/dL — ABNORMAL HIGH (ref 1.7–2.4)

## 2020-08-08 MED ORDER — TORSEMIDE 20 MG PO TABS
20.0000 mg | ORAL_TABLET | Freq: Every day | ORAL | Status: DC
  Administered 2020-08-09 – 2020-08-11 (×3): 20 mg via ORAL
  Filled 2020-08-08 (×3): qty 1

## 2020-08-08 NOTE — Progress Notes (Signed)
   Date of Admission:  08/04/2020     ID: Russell Grant is a 49 y.o. male Principal Problem:   AKI (acute kidney injury) ([place]) Active Problems:   Essential hypertension   CKD (chronic kidney disease) stage 3, GFR 30-59 ml/min (HCC)   FSGS (focal segmental glomerulosclerosis), tip variant with nephrosis   Generalized weakness   Dehydration   New onset type 2 diabetes mellitus ([place])   SIRS due to infectious process with acute organ dysfunction (HCC)    Subjective: Feeling better Pain less No fever  Medications:  . aspirin  81 mg Oral Daily  . calcium-vitamin D  1 tablet Oral Daily  . enoxaparin (LOVENOX) injection  55 mg Subcutaneous Q24H  . gabapentin  600 mg Oral TID  . insulin aspart  0-20 Units Subcutaneous TID WC  . insulin aspart  0-5 Units Subcutaneous QHS  . insulin detemir  20 Units Subcutaneous Daily  . loratadine  10 mg Oral Daily  . multivitamin with minerals  1 tablet Oral Daily  . predniSONE  20 mg Oral Q1400  . predniSONE  40 mg Oral Q breakfast  . simvastatin  40 mg Oral Daily  . sulfamethoxazole-trimethoprim  1 tablet Oral Daily  . [START ON 08/09/2020] torsemide  20 mg Oral Daily    Objective: Vital signs in last 24 hours: Temp:  [97.5 F (36.4 C)-98.4 F (36.9 C)] 98.2 F (36.8 C) (12/02 2013) Pulse Rate:  [99-110] 108 (12/02 2013) Resp:  [16-20] 16 (12/02 2013) BP: (143-178)/(80-94) 154/83 (12/02 2013) SpO2:  [97 %-98 %] 97 % (12/02 2013)  PHYSICAL EXAM:  General: well Head: Normocephalic, without obvious abnormality, atraumatic. Lungs: Clear to auscultation bilaterally. No Wheezing or Rhonchi. No rales. Heart: Regular rate and rhythm, no murmur, rub or gallop. Abdomen: Soft, non-tender,not distended. Bowel sounds normal. No masses Extremities: purpuric rash left thigh, hip and leg More lesions than 2 days  08/08/20   08/05/20   08/08/20   08/05/20   Left leg swollen  Skin: as above Lymph: Cervical, supraclavicular  normal. Neurologic: Grossly non-focal  Lab Results Recent Labs    08/07/20 0623 08/08/20 0625  WBC 17.3* 15.2*  HGB 10.1* 9.5*  HCT 29.8* 27.5*  NA 136 134*  K 4.6 4.4  CL 98 100  CO2 28 26  BUN 58* 44*  CREATININE 1.00 0.89   Liver Panel Recent Labs    08/07/20 0623 08/08/20 0625  PROT 5.1* 4.7*  ALBUMIN 2.3* 2.1*  AST 65* 51*  ALT 222* 177*  ALKPHOS 100 99  BILITOT 0.9 0.8     Assessment/Plan: 49 yr male with neohrotic syndrome due to FSGS since Sept 2021 on high dose prednisone and multiple diuretics presented with weakness, fatigue and fall Found to be dehydrated due to the diuretics Improvedwith fluids  Fall/syncope- likely had orthostasis Had SIRS like vitals but no evidence clinically of UTi, pneumonia or bacteremia- so no antibiotics needed  Left leg- purpuric lesions- multidermatomal herpes zoster on  Now on IV acyclovir pathology confirms herpes Pt is immunecompromised due to high dose steroids- has been on prednisone 80mg  for 3 months, has been reduced to 60mg    On  Bactrim SS 1 a day for PCP prophylaxis Anemia- will check irone, b12, folate Mild thrombocytopenia  AKI on underlying nephrotic syndrome- due to prerenal- improved with IV fluids  Impaired glucose secondary to steroids  Discussed the management with patient and his wife

## 2020-08-08 NOTE — Progress Notes (Signed)
[facility]  ROUNDING NOTE   Subjective:   [name] was admitted to [facility] on 08/04/2020 for Dehydration [E86.0] Dizziness [R42] Diuretic overdose, accidental or unintentional, initial encounter [T50.2X1A] Leukocytosis, unspecified type [D72.829] Chronic kidney disease, unspecified CKD stage [N18.9]  Patient  resting in bed,  In better spirit today.  Finished eating his snack when seen Feels like the rash on the leg is now getting better States he is able to ambulate to the bathroom in the room Currently on room air.  No shortness of breath Continues to have some leg edema down from mid shins   Objective:  Vital signs in last 24 hours:  Temp:  [97.5 F (36.4 C)-98.4 F (36.9 C)] 98.1 F (36.7 C) (12/02 1556) Pulse Rate:  [99-111] 111 (12/02 1556) Resp:  [16-20] 18 (12/02 1556) BP: (143-178)/(78-94) 163/86 (12/02 1556) SpO2:  [97 %-98 %] 97 % (12/02 1556)  Weight change:  Filed Weights   08/04/20 0101  Weight: 112 kg    Intake/Output: I/O last 3 completed shifts: In: 167 [IV Piggyback:167] Out: 2475 [Urine:2475]       Intake/Output this shift:  No intake/output data recorded.  Physical Exam: General:  Lying in bed, in no acute distress  Head:  Normocephalic, atraumatic  Eyes:  Sclerae and conjunctivae clear  Lungs:   Lungs clear to auscultation bilaterally  Heart:  S1S2 no rubs or gallops  Abdomen:  Soft, nontender, nondistended  Extremities:  2+ bilateral lower extremity edema from mid shins  Neurologic:  Awake, alert, oriented  Skin: Red purpuric lesions on left lower extremity from left lower back down        Basic Metabolic Panel: Recent Labs  Lab 08/04/20 0116 08/04/20 0116 08/04/20 0740 08/05/20 0440 08/05/20 0440 08/06/20 0611 08/07/20 0623 08/08/20 0625  NA 136  --   --  137  --  135 136 134*  K 4.5  --   --  4.8  --  4.5 4.6 4.4  CL 90*  --   --  98  --  98 98 100  CO2 29  --   --  30  --  27 28 26   GLUCOSE  357*  --   --  178*  --  212* 128* 127*  BUN 107*  --   --  89*  --  71* 58* 44*  CREATININE 1.93*   < > 2.07* 1.22  --  1.19 1.00 0.89  CALCIUM 8.7*   < >  --  8.5*   < > 8.3* 8.1* 7.7*  MG  --   --   --   --   --  2.6* 2.6* 2.6*  PHOS  --   --   --   --   --  3.7 3.1  --    < > = values in this interval not displayed.    Liver Function Tests: Recent Labs  Lab 08/04/20 0740 08/06/20 0611 08/07/20 0623 08/08/20 0625  AST 73* 80* 65* 51*  ALT 305* 242* 222* 177*  ALKPHOS 134* 106 100 99  BILITOT 0.6 0.9 0.9 0.8  PROT 6.0* 4.8* 5.1* 4.7*  ALBUMIN 2.7* 2.2* 2.3* 2.1*   No results for input(s): LIPASE, AMYLASE in the last 168 hours. No results for input(s): AMMONIA in the last 168 hours.  CBC: Recent Labs  Lab 08/04/20 0740 08/05/20 0440 08/06/20 0611 08/07/20 0623 08/08/20 0625  WBC 22.2* 21.0* 16.8* 17.3* 15.2*  NEUTROABS 18.9*  --   --   --   --  HGB 11.2* 10.4* 9.5* 10.1* 9.5*  HCT 34.0* 31.5* 28.3* 29.8* 27.5*  MCV 98.0 98.1 96.6 97.1 96.5  PLT 159 135* 124* 134* 124*    Cardiac Enzymes: Recent Labs  Lab 08/04/20 0740  CKTOTAL 14*    BNP: Invalid input(s): POCBNP  CBG: Recent Labs  Lab 08/07/20 1631 08/07/20 2052 08/08/20 0847 08/08/20 1227 08/08/20 1642  GLUCAP 310* 223* 178* 288* 256*    Microbiology: Results for orders placed or performed during the hospital encounter of 08/04/20  Resp Panel by RT-PCR (Flu A&B, Covid) Nasopharyngeal Swab     Status: None   Collection Time: 08/04/20  5:07 AM   Specimen: Nasopharyngeal Swab; Nasopharyngeal(NP) swabs in vial transport medium  Result Value Ref Range Status   SARS Coronavirus 2 by RT PCR NEGATIVE NEGATIVE Final    Comment: (NOTE) SARS-CoV-2 target nucleic acids are NOT DETECTED.  The SARS-CoV-2 RNA is generally detectable in upper respiratory specimens during the acute phase of infection. The lowest concentration of SARS-CoV-2 viral copies this assay can detect is 138 copies/mL. A negative  result does not preclude SARS-Cov-2 infection and should not be used as the sole basis for treatment or other patient management decisions. A negative result may occur with  improper specimen collection/handling, submission of specimen other than nasopharyngeal swab, presence of viral mutation(s) within the areas targeted by this assay, and inadequate number of viral copies(<138 copies/mL). A negative result must be combined with clinical observations, patient history, and epidemiological information. The expected result is Negative.  Fact Sheet for Patients:  EntrepreneurPulse.com.au  Fact Sheet for Healthcare Providers:  IncredibleEmployment.be  This test is no t yet approved or cleared by the [place] FDA and  has been authorized for detection and/or diagnosis of SARS-CoV-2 by FDA under an Emergency Use Authorization (EUA). This EUA will remain  in effect (meaning this test can be used) for the duration of the COVID-19 declaration under Section 564(b)(1) of the Act, 21 U.S.C.section 360bbb-3(b)(1), unless the authorization is terminated  or revoked sooner.       Influenza A by PCR NEGATIVE NEGATIVE Final   Influenza B by PCR NEGATIVE NEGATIVE Final    Comment: (NOTE) The Xpert Xpress SARS-CoV-2/FLU/RSV plus assay is intended as an aid in the diagnosis of influenza from Nasopharyngeal swab specimens and should not be used as a sole basis for treatment. Nasal washings and aspirates are unacceptable for Xpert Xpress SARS-CoV-2/FLU/RSV testing.  Fact Sheet for Patients: EntrepreneurPulse.com.au  Fact Sheet for Healthcare Providers: IncredibleEmployment.be  This test is not yet approved or cleared by the [place] FDA and has been authorized for detection and/or diagnosis of SARS-CoV-2 by FDA under an Emergency Use Authorization (EUA). This EUA will remain in effect (meaning this test can be used)  for the duration of the COVID-19 declaration under Section 564(b)(1) of the Act, 21 U.S.C. section 360bbb-3(b)(1), unless the authorization is terminated or revoked.  Performed at [facility], [address] [place], [place] [address]   Culture, blood (routine x 2)     Status: None (Preliminary result)   Collection Time: 08/04/20  7:40 AM   Specimen: BLOOD  Result Value Ref Range Status   Specimen Description BLOOD LEFT ANTECUBITAL  Final   Special Requests   Final    BOTTLES DRAWN AEROBIC AND ANAEROBIC Blood Culture results may not be optimal due to an excessive volume of blood received in culture bottles   Culture   Final    NO GROWTH 4 DAYS  Performed at [facility], [address] [place], [place] [address]    Report Status PENDING  Incomplete  Culture, blood (routine x 2)     Status: None (Preliminary result)   Collection Time: 08/04/20  2:40 PM   Specimen: BLOOD  Result Value Ref Range Status   Specimen Description BLOOD RIGHT ANTECUBITAL  Final   Special Requests   Final    BOTTLES DRAWN AEROBIC AND ANAEROBIC Blood Culture results may not be optimal due to an inadequate volume of blood received in culture bottles   Culture   Final    NO GROWTH 4 DAYS Performed at [facility], [address] [place], [place] [address]    Report Status PENDING  Incomplete    Coagulation Studies: No results for input(s): LABPROT, INR in the last 72 hours.  Urinalysis: No results for input(s): COLORURINE, LABSPEC, PHURINE, GLUCOSEU, HGBUR, BILIRUBINUR, KETONESUR, PROTEINUR, UROBILINOGEN, NITRITE, LEUKOCYTESUR in the last 72 hours.  Invalid input(s): APPERANCEUR    Imaging: No results found.   Medications:   . sodium chloride Stopped (08/06/20 1812)  . acyclovir 850 mg (08/08/20 1550)   . aspirin  81 mg Oral Daily  . calcium-vitamin D  1 tablet Oral Daily  . enoxaparin (LOVENOX) injection  55 mg Subcutaneous Q24H  . gabapentin  600 mg Oral  TID  . insulin aspart  0-20 Units Subcutaneous TID WC  . insulin aspart  0-5 Units Subcutaneous QHS  . insulin detemir  20 Units Subcutaneous Daily  . loratadine  10 mg Oral Daily  . multivitamin with minerals  1 tablet Oral Daily  . predniSONE  20 mg Oral Q1400  . predniSONE  40 mg Oral Q breakfast  . simvastatin  40 mg Oral Daily  . sulfamethoxazole-trimethoprim  1 tablet Oral Daily   sodium chloride, acetaminophen **OR** acetaminophen, ondansetron **OR** ondansetron (ZOFRAN) IV, witch hazel-glycerin  Assessment/ Plan:  [name] is a 49 y.o. 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 Currently on high dose steroids.   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  Along with relative hyponatremia and metabolic alkalosis, suspect overdiuresis. Lab Results  Component Value Date   CREATININE 0.89 08/08/2020   CREATININE 1.00 08/07/2020   CREATININE 1.19 08/06/2020  Renal function improving 12/01 0701 - 12/02 0700 In: 167 [IV Piggyback:167] Out: 1700 [Urine:1700]   We will continue monitoring renal function Prednisone tapered to lower dose this admission  #Lower extremity edema Restart torsemide 20 mg daily in a.m.  # Left lower extremity rashes with nerve pain/ multidermatomal herpes zoster infection Patient reports pain and new rashes on his knee and left foot He is on iv acyclovir Management per ID team  #Steroid induced hyperglycemia Lab Results  Component Value Date   HGBA1C 7.1 (H) 08/06/2020   Blood glucose readings within acceptable range today We will continue current antihyperglycemic management PCP prophylaxis with Bactrim   LOS: 4 Labrea Eccleston NP 12/2/20216:13 PM

## 2020-08-08 NOTE — Progress Notes (Addendum)
PROGRESS NOTE  Russell Grant:[id number] DOB: Dec 17, 1970 DOA: 08/04/2020 PCP: Sofie Hartigan, MD   LOS: 4 days   Brief narrative: As per HPI,  Russell Grant is a 49 y.o. male with medical history significant for hypertension, dyslipidemia, history of focal segmental glomerulosclerosis, stage III chronic kidney disease presented to ED with dizziness, weakness for several weeks with recent multiple falls.   Patient was started on metolazone on 07/24/20 by his nephrologist and 1 week later (11/24) he was advised to hold the metolazone since his peripheral edema had improved significantly and patient had complained of multiple falls at home.  He was also advised to reduce his torsemide to 40 mg daily and to continue spironolactone.  He complained of dizziness, lightheadedness and dry mouth with increased frequency of urination.  In the ED, patient was noted to have elevated creatinine of 1.9 compared to baseline of 1.3 from 2 months back.  WBC was elevated at 22,000.    Respiratory viral panel was negative including Covid.  Chest x-ray without any acute findings.  Urinalysis showed some protein and hemoglobin in urine.  Patient was then admitted to hospital for worsening renal function and left lower extremity painful rash.  Nephrology was consulted and patient was admitted to the hospital.  Due to painful rash concerning for herpes zoster infectious disease was also consulted.  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   AKI was  thought to be secondary to diuretics with are still on hold.  Nephrology on board.  On prednisone for nephrotic syndrome.  Would defer the decision of ACE inhibitor's initiation to nephrology.  Spoke with nephrology Dr. Candiss Norse. Lab Results  Component Value Date   CREATININE 0.89 08/08/2020   CREATININE 1.00 08/07/2020   CREATININE 1.19 08/06/2020   Disseminated herpes zoster.  In the context of steroids.  Multidermatomal and seems to have progressed with pain .dermatology was consulted and patient underwent punch biopsy which showed  viral cytopathic changes of herpes simplex..  On IV acyclovir.  Continue Neurontin.  On Bactrim as well as per ID.  Hyperglycemia with new onset diabetes mellitus type 2.  Could be steroid-induced.  Continue long-acting insulin and sliding scale insulin since patient is on a steroid.  Patient will benefit from possibly long-acting insulin plus oral hypoglycemics on discharge if he will remain on steroids  Stage III chronic kidney disease/FSGS/nephrotic syndrome On p.o. prednisone.  Nephrology on board.  Follow nephrology recommendation.  SIRS on presentation with lactic acidosis. No evidence of infection.   WBC elevated due to steroids.    Generalized weakness with frequent falls Physical therapy has seen the patient.  Recommend no skilled therapy needs.  Obesity (BMI 35) With underlying focal segmental glomerulonephritis. Would benefit from ongoing weight loss.   Dyslipidemia Continue statins for now.  Has mildly elevated LFTs.  Likely benefit of statin is in the situation.  DVT prophylaxis: SCDs Start: 08/04/20 0555, subcu Lovenox   Code Status: Full code  Family Communication:  Spoke with the patient's spouse at bedside.  Status is:  Inpatient  Remains inpatient appropriate because:IV treatments appropriate due to intensity of illness or inability to take PO, Inpatient level of care appropriate due to severity of illness, disseminated herpes  zoster on IV acyclovir  Dispo: The patient is from: Home              Anticipated d/c is to: Home              Anticipated d/c date is: 2 to 3 days.  As per ID and nephrology recommendation.              Patient currently is not medically stable to d/c.   Consultants:  Nephrology  Infectious disease  Dermatology  Procedures:  Punch biopsy of skin lesion on 08/05/2020 by dermatology Dr. Nicole Kindred  Antibiotics:  . Vancomycin, meropenem 11/28> discontinued . Valtrex > 08/05/2020-11/30 . IV acyclovir 11/30>  Subjective: Today, patient was seen and examined at bedside.  Patient states that his pain has not gotten worse and the skin rash might have been a stable and may be slightly lesser.  Patient denies cough, fever, chills, nausea, vomiting.    Objective: Vitals:   08/08/20 0855 08/08/20 1230  BP: (!) 155/80 (!) 178/89  Pulse: 99 (!) 109  Resp: 16 20  Temp: 98.4 F (36.9 C) 98.2 F (36.8 C)  SpO2: 97% 98%    Intake/Output Summary (Last 24 hours) at 08/08/2020 1431 Last data filed at 08/08/2020 9924 Gross per 24 hour  Intake --  Output 1500 ml  Net -1500 ml   Filed Weights   08/04/20 0101  Weight: 112 kg   Body mass index is 35.44 kg/m.   Physical Exam: General: Obese built, not in obvious distress, alert awake oriented HENT:   No scleral pallor or icterus noted. Oral mucosa is moist.  Chest:  Clear breath sounds.  Diminished breath sounds bilaterally. No crackles or wheezes.  CVS: S1 &S2 heard. No murmur.  Regular rate and rhythm. Abdomen: Soft, nontender, obese, nondistended.  Bowel sounds are heard.   Extremities: No cyanosis, clubbing or edema.  Peripheral pulses are palpable. Psych: Alert, awake and oriented, normal mood CNS:  No cranial nerve deficits.  Power equal in all extremities.   Skin: Warm and dry.  Herpetic zoster lesions over the left lower extremity thigh and groin area multidermatomal   Data Review: I have personally reviewed the following  laboratory data and studies,  CBC: Recent Labs  Lab 08/04/20 0740 08/05/20 0440 08/06/20 0611 08/07/20 0623 08/08/20 0625  WBC 22.2* 21.0* 16.8* 17.3* 15.2*  NEUTROABS 18.9*  --   --   --   --   HGB 11.2* 10.4* 9.5* 10.1* 9.5*  HCT 34.0* 31.5* 28.3* 29.8* 27.5*  MCV 98.0 98.1 96.6 97.1 96.5  PLT 159 135* 124* 134* 268*   Basic Metabolic Panel: Recent Labs  Lab 08/04/20 0116 08/04/20 0116 08/04/20 0740 08/05/20 0440 08/06/20 0611 08/07/20 0623 08/08/20 0625  NA 136  --   --  137 135 136 134*  K 4.5  --   --  4.8 4.5 4.6 4.4  CL 90*  --   --  98 98 98 100  CO2 29  --   --  30 27 28 26   GLUCOSE 357*  --   --  178* 212* 128* 127*  BUN 107*  --   --  89* 71* 58* 44*  CREATININE 1.93*   < > 2.07* 1.22 1.19 1.00 0.89  CALCIUM 8.7*  --   --  8.5* 8.3* 8.1* 7.7*  MG  --   --   --   --  2.6* 2.6* 2.6*  PHOS  --   --   --   --  3.7 3.1  --    < > = values in this interval not displayed.   Liver Function Tests: Recent Labs  Lab 08/04/20 0740 08/06/20 0611 08/07/20 0623 08/08/20 0625  AST 73* 80* 65* 51*  ALT 305* 242* 222* 177*  ALKPHOS 134* 106 100 99  BILITOT 0.6 0.9 0.9 0.8  PROT 6.0* 4.8* 5.1* 4.7*  ALBUMIN 2.7* 2.2* 2.3* 2.1*   No results for input(s): LIPASE, AMYLASE in the last 168 hours. No results for input(s): AMMONIA in the last 168 hours. Cardiac Enzymes: Recent Labs  Lab 08/04/20 0740  CKTOTAL 14*   BNP (last 3 results) Recent Labs    04/07/20 0116 04/10/20 1850 05/07/20 0759  BNP 78.5 57.1 11.0    ProBNP (last 3 results) No results for input(s): PROBNP in the last 8760 hours.  CBG: Recent Labs  Lab 08/07/20 1205 08/07/20 1631 08/07/20 2052 08/08/20 0847 08/08/20 1227  GLUCAP 214* 310* 223* 178* 288*   Recent Results (from the past 240 hour(s))  Resp Panel by RT-PCR (Flu A&B, Covid) Nasopharyngeal Swab     Status: None   Collection Time: 08/04/20  5:07 AM   Specimen: Nasopharyngeal Swab; Nasopharyngeal(NP) swabs in vial transport  medium  Result Value Ref Range Status   SARS Coronavirus 2 by RT PCR NEGATIVE NEGATIVE Final    Comment: (NOTE) SARS-CoV-2 target nucleic acids are NOT DETECTED.  The SARS-CoV-2 RNA is generally detectable in upper respiratory specimens during the acute phase of infection. The lowest concentration of SARS-CoV-2 viral copies this assay can detect is 138 copies/mL. A negative result does not preclude SARS-Cov-2 infection and should not be used as the sole basis for treatment or other patient management decisions. A negative result may occur with  improper specimen collection/handling, submission of specimen other than nasopharyngeal swab, presence of viral mutation(s) within the areas targeted by this assay, and inadequate number of viral copies(<138 copies/mL). A negative result must be combined with clinical observations, patient history, and epidemiological information. The expected result is Negative.  Fact Sheet for Patients:  EntrepreneurPulse.com.au  Fact Sheet for Healthcare Providers:  IncredibleEmployment.be  This test is no t yet approved or cleared by the [place] FDA and  has been authorized for detection and/or diagnosis of SARS-CoV-2 by FDA under an Emergency Use Authorization (EUA). This EUA will remain  in effect (meaning this test can be used) for the duration of the COVID-19 declaration under Section 564(b)(1) of the Act, 21 U.S.C.section 360bbb-3(b)(1), unless the authorization is terminated  or revoked sooner.       Influenza A by PCR NEGATIVE NEGATIVE Final   Influenza B by PCR NEGATIVE NEGATIVE Final    Comment: (NOTE) The Xpert Xpress SARS-CoV-2/FLU/RSV plus assay is intended as an aid in the diagnosis of influenza from Nasopharyngeal swab specimens and should not be used as a sole basis for treatment. Nasal washings and aspirates are unacceptable for Xpert Xpress SARS-CoV-2/FLU/RSV testing.  Fact Sheet for  Patients: EntrepreneurPulse.com.au  Fact Sheet for Healthcare Providers: IncredibleEmployment.be  This test is not yet approved or cleared by the [place] FDA and has been authorized for detection and/or diagnosis of SARS-CoV-2 by FDA under an Emergency Use Authorization (EUA). This EUA will remain in effect (meaning this test can be used) for the duration  of the COVID-19 declaration under Section 564(b)(1) of the Act, 21 U.S.C. section 360bbb-3(b)(1), unless the authorization is terminated or revoked.  Performed at [facility], [address] [place], [place] [address]   Culture, blood (routine x 2)     Status: None (Preliminary result)   Collection Time: 08/04/20  7:40 AM   Specimen: BLOOD  Result Value Ref Range Status   Specimen Description BLOOD LEFT ANTECUBITAL  Final   Special Requests   Final    BOTTLES DRAWN AEROBIC AND ANAEROBIC Blood Culture results may not be optimal due to an excessive volume of blood received in culture bottles   Culture   Final    NO GROWTH 4 DAYS Performed at [facility], [address], [place], [place] [address]    Report Status PENDING  Incomplete  Culture, blood (routine x 2)     Status: None (Preliminary result)   Collection Time: 08/04/20  2:40 PM   Specimen: BLOOD  Result Value Ref Range Status   Specimen Description BLOOD RIGHT ANTECUBITAL  Final   Special Requests   Final    BOTTLES DRAWN AEROBIC AND ANAEROBIC Blood Culture results may not be optimal due to an inadequate volume of blood received in culture bottles   Culture   Final    NO GROWTH 4 DAYS Performed at [facility], [address], [place], [place] [address]    Report Status PENDING  Incomplete     Studies: No results found.    Flora Lipps, MD  [facility] 08/08/2020

## 2020-08-08 NOTE — Progress Notes (Signed)
Inpatient Diabetes Program Recommendations  AACE/ADA: New Consensus Statement on Inpatient Glycemic Control (2015)  Target Ranges:  Prepandial:   less than 140 mg/dL      Peak postprandial:   less than 180 mg/dL (1-2 hours)      Critically ill patients:  140 - 180 mg/dL   Lab Results  Component Value Date   GLUCAP 288 (H) 08/08/2020   HGBA1C 7.1 (H) 08/06/2020   Current orders for Inpatient glycemic control: Novolog 0-20 units TID, Novolog 0-5 units QHS, Levemir 20 units QD Prednisone 40 mg QAM, 20 mg QD  Inpatient Diabetes Program Recommendations:    Consider adding Novolog 3 units TID (assuming patient is consuming >50% of meals).   Thanks, Bronson Curb, MSN, RNC-OB Diabetes Coordinator [phone] (8a-5p)

## 2020-08-09 LAB — CULTURE, BLOOD (ROUTINE X 2)
Culture: NO GROWTH
Culture: NO GROWTH

## 2020-08-09 LAB — PHOSPHORUS: Phosphorus: 2.6 mg/dL (ref 2.5–4.6)

## 2020-08-09 LAB — GLUCOSE, CAPILLARY
Glucose-Capillary: 138 mg/dL — ABNORMAL HIGH (ref 70–99)
Glucose-Capillary: 197 mg/dL — ABNORMAL HIGH (ref 70–99)
Glucose-Capillary: 250 mg/dL — ABNORMAL HIGH (ref 70–99)
Glucose-Capillary: 285 mg/dL — ABNORMAL HIGH (ref 70–99)

## 2020-08-09 LAB — CBC
HCT: 30 % — ABNORMAL LOW (ref 39.0–52.0)
Hemoglobin: 10 g/dL — ABNORMAL LOW (ref 13.0–17.0)
MCH: 32.9 pg (ref 26.0–34.0)
MCHC: 33.3 g/dL (ref 30.0–36.0)
MCV: 98.7 fL (ref 80.0–100.0)
Platelets: 124 10*3/uL — ABNORMAL LOW (ref 150–400)
RBC: 3.04 MIL/uL — ABNORMAL LOW (ref 4.22–5.81)
RDW: 18.2 % — ABNORMAL HIGH (ref 11.5–15.5)
WBC: 16.8 10*3/uL — ABNORMAL HIGH (ref 4.0–10.5)
nRBC: 2.9 % — ABNORMAL HIGH (ref 0.0–0.2)

## 2020-08-09 LAB — COMPREHENSIVE METABOLIC PANEL
ALT: 164 U/L — ABNORMAL HIGH (ref 0–44)
AST: 58 U/L — ABNORMAL HIGH (ref 15–41)
Albumin: 2.3 g/dL — ABNORMAL LOW (ref 3.5–5.0)
Alkaline Phosphatase: 108 U/L (ref 38–126)
Anion gap: 8 (ref 5–15)
BUN: 35 mg/dL — ABNORMAL HIGH (ref 6–20)
CO2: 27 mmol/L (ref 22–32)
Calcium: 7.8 mg/dL — ABNORMAL LOW (ref 8.9–10.3)
Chloride: 104 mmol/L (ref 98–111)
Creatinine, Ser: 0.96 mg/dL (ref 0.61–1.24)
GFR, Estimated: >60 mL/min (ref >60)
Glucose, Bld: 144 mg/dL — ABNORMAL HIGH (ref 70–99)
Potassium: 4.9 mmol/L (ref 3.5–5.1)
Sodium: 139 mmol/L (ref 135–145)
Total Bilirubin: 0.8 mg/dL (ref 0.3–1.2)
Total Protein: 4.9 g/dL — ABNORMAL LOW (ref 6.5–8.1)

## 2020-08-09 LAB — VITAMIN B12: Vitamin B-12: 526 pg/mL (ref 180–914)

## 2020-08-09 LAB — FERRITIN: Ferritin: 1112 ng/mL — ABNORMAL HIGH (ref 24–336)

## 2020-08-09 LAB — FOLATE: Folate: 14.8 ng/mL (ref >5.9)

## 2020-08-09 LAB — MAGNESIUM: Magnesium: 2.8 mg/dL — ABNORMAL HIGH (ref 1.7–2.4)

## 2020-08-09 LAB — IRON AND TIBC
Iron: 129 ug/dL (ref 45–182)
Saturation Ratios: 41 % — ABNORMAL HIGH (ref 17.9–39.5)
TIBC: 318 ug/dL (ref 250–450)
UIBC: 189 ug/dL

## 2020-08-09 MED ORDER — BENAZEPRIL HCL 5 MG PO TABS
5.0000 mg | ORAL_TABLET | Freq: Every day | ORAL | Status: DC
  Administered 2020-08-09 – 2020-08-10 (×2): 5 mg via ORAL
  Filled 2020-08-09 (×3): qty 1

## 2020-08-09 MED ORDER — INSULIN DETEMIR 100 UNIT/ML ~~LOC~~ SOLN
22.0000 [IU] | Freq: Every day | SUBCUTANEOUS | Status: DC
  Administered 2020-08-10 – 2020-08-11 (×2): 22 [IU] via SUBCUTANEOUS
  Filled 2020-08-09 (×3): qty 0.22

## 2020-08-09 NOTE — Progress Notes (Signed)
Inpatient Diabetes Program Recommendations  AACE/ADA: New Consensus Statement on Inpatient Glycemic Control   Target Ranges:  Prepandial:   less than 140 mg/dL      Peak postprandial:   less than 180 mg/dL (1-2 hours)      Critically ill patients:  140 - 180 mg/dL   Results for [name], [name] (MRN [id number]) as of 08/09/2020 11:58  Ref. Range 08/08/2020 08:47 08/08/2020 12:27 08/08/2020 16:42 08/08/2020 20:20 08/09/2020 07:57  Glucose-Capillary Latest Ref Range: 70 - 99 mg/dL 178 (H) 288 (H) 256 (H) 236 (H) 138 (H)   Review of Glycemic Control  Current orders for Inpatient glycemic control: Levemir 20 units daily, Novolog 0-20 units TID with meals, Novolog 0-5 units QHS; Prednisone 40 mg QAM, Prednisone 20 mg daily  Inpatient Diabetes Program Recommendations:    Insulin: If steroids are continued, please consider ordering Novolog 5 units TID with meals for meal coverage if patient eats at least 50% of meals.  Thanks, Barnie Alderman, RN, MSN, CDE Diabetes Coordinator Inpatient Diabetes Program [phone] (Team Pager from 8am to 5pm)

## 2020-08-09 NOTE — Progress Notes (Signed)
[facility]  ROUNDING NOTE   Subjective:   [name] was admitted to [facility] on 08/04/2020 for Dehydration [E86.0] Dizziness [R42] Diuretic overdose, accidental or unintentional, initial encounter [T50.2X1A] Leukocytosis, unspecified type [D72.829] Chronic kidney disease, unspecified CKD stage [N18.9]  Patient  resting in bed,    Feels like the rash on the leg is now getting better States he is able to ambulate to the bathroom   Currently on room air.  No shortness of breath Continues to have some leg edema down from mid shins   Objective:  Vital signs in last 24 hours:  Temp:  [97.6 F (36.4 C)-98.2 F (36.8 C)] 98.2 F (36.8 C) (12/03 1157) Pulse Rate:  [91-110] 98 (12/03 1157) Resp:  [16-20] 20 (12/03 1157) BP: (135-163)/(73-86) 135/80 (12/03 1157) SpO2:  [97 %-98 %] 98 % (12/03 1157)  Weight change:  Filed Weights   08/04/20 0101  Weight: 112 kg    Intake/Output: I/O last 3 completed shifts: In: -  Out: 3000 [Urine:3000]       Intake/Output this shift:  Total I/O In: 1002 [IV Piggyback:1002] Out: 700 [Urine:700]  Physical Exam: General:  Lying in bed, in no acute distress  Head:  Normocephalic, atraumatic  Eyes:  Sclerae and conjunctivae clear  Lungs:   Lungs clear to auscultation bilaterally  Heart:  S1S2 no rubs or gallops  Abdomen:  Soft, nontender, nondistended  Extremities:  2+ bilateral lower extremity edema from mid shins  Neurologic:  Awake, alert, oriented  Skin: Red purpuric lesions on left lower extremity from left lower back down        Basic Metabolic Panel: Recent Labs  Lab 08/05/20 0440 08/05/20 0440 08/06/20 0611 08/06/20 0611 08/07/20 0623 08/08/20 0625 08/09/20 0518  NA 137  --  135  --  136 134* 139  K 4.8  --  4.5  --  4.6 4.4 4.9  CL 98  --  98  --  98 100 104  CO2 30  --  27  --  28 26 27   GLUCOSE 178*  --  212*  --  128* 127* 144*  BUN 89*  --  71*  --  58* 44* 35*  CREATININE 1.22  --  1.19   --  1.00 0.89 0.96  CALCIUM 8.5*   < > 8.3*   < > 8.1* 7.7* 7.8*  MG  --   --  2.6*  --  2.6* 2.6* 2.8*  PHOS  --   --  3.7  --  3.1  --  2.6   < > = values in this interval not displayed.    Liver Function Tests: Recent Labs  Lab 08/04/20 0740 08/06/20 0611 08/07/20 0623 08/08/20 0625 08/09/20 0518  AST 73* 80* 65* 51* 58*  ALT 305* 242* 222* 177* 164*  ALKPHOS 134* 106 100 99 108  BILITOT 0.6 0.9 0.9 0.8 0.8  PROT 6.0* 4.8* 5.1* 4.7* 4.9*  ALBUMIN 2.7* 2.2* 2.3* 2.1* 2.3*   No results for input(s): LIPASE, AMYLASE in the last 168 hours. No results for input(s): AMMONIA in the last 168 hours.  CBC: Recent Labs  Lab 08/04/20 0740 08/04/20 0740 08/05/20 0440 08/06/20 0611 08/07/20 0623 08/08/20 0625 08/09/20 0518  WBC 22.2*   < > 21.0* 16.8* 17.3* 15.2* 16.8*  NEUTROABS 18.9*  --   --   --   --   --   --   HGB 11.2*   < > 10.4* 9.5* 10.1*  9.5* 10.0*  HCT 34.0*   < > 31.5* 28.3* 29.8* 27.5* 30.0*  MCV 98.0   < > 98.1 96.6 97.1 96.5 98.7  PLT 159   < > 135* 124* 134* 124* 124*   < > = values in this interval not displayed.    Cardiac Enzymes: Recent Labs  Lab 08/04/20 0740  CKTOTAL 14*    BNP: Invalid input(s): POCBNP  CBG: Recent Labs  Lab 08/08/20 1227 08/08/20 1642 08/08/20 2020 08/09/20 0757 08/09/20 1158  GLUCAP 288* 256* 236* 138* 197*    Microbiology: Results for orders placed or performed during the hospital encounter of 08/04/20  Resp Panel by RT-PCR (Flu A&B, Covid) Nasopharyngeal Swab     Status: None   Collection Time: 08/04/20  5:07 AM   Specimen: Nasopharyngeal Swab; Nasopharyngeal(NP) swabs in vial transport medium  Result Value Ref Range Status   SARS Coronavirus 2 by RT PCR NEGATIVE NEGATIVE Final    Comment: (NOTE) SARS-CoV-2 target nucleic acids are NOT DETECTED.  The SARS-CoV-2 RNA is generally detectable in upper respiratory specimens during the acute phase of infection. The lowest concentration of SARS-CoV-2 viral copies  this assay can detect is 138 copies/mL. A negative result does not preclude SARS-Cov-2 infection and should not be used as the sole basis for treatment or other patient management decisions. A negative result may occur with  improper specimen collection/handling, submission of specimen other than nasopharyngeal swab, presence of viral mutation(s) within the areas targeted by this assay, and inadequate number of viral copies(<138 copies/mL). A negative result must be combined with clinical observations, patient history, and epidemiological information. The expected result is Negative.  Fact Sheet for Patients:  EntrepreneurPulse.com.au  Fact Sheet for Healthcare Providers:  IncredibleEmployment.be  This test is no t yet approved or cleared by the [place] FDA and  has been authorized for detection and/or diagnosis of SARS-CoV-2 by FDA under an Emergency Use Authorization (EUA). This EUA will remain  in effect (meaning this test can be used) for the duration of the COVID-19 declaration under Section 564(b)(1) of the Act, 21 U.S.C.section 360bbb-3(b)(1), unless the authorization is terminated  or revoked sooner.       Influenza A by PCR NEGATIVE NEGATIVE Final   Influenza B by PCR NEGATIVE NEGATIVE Final    Comment: (NOTE) The Xpert Xpress SARS-CoV-2/FLU/RSV plus assay is intended as an aid in the diagnosis of influenza from Nasopharyngeal swab specimens and should not be used as a sole basis for treatment. Nasal washings and aspirates are unacceptable for Xpert Xpress SARS-CoV-2/FLU/RSV testing.  Fact Sheet for Patients: EntrepreneurPulse.com.au  Fact Sheet for Healthcare Providers: IncredibleEmployment.be  This test is not yet approved or cleared by the [place] FDA and has been authorized for detection and/or diagnosis of SARS-CoV-2 by FDA under an Emergency Use Authorization (EUA). This EUA  will remain in effect (meaning this test can be used) for the duration of the COVID-19 declaration under Section 564(b)(1) of the Act, 21 U.S.C. section 360bbb-3(b)(1), unless the authorization is terminated or revoked.  Performed at [facility], [address] [place], [place] [address]   Culture, blood (routine x 2)     Status: None   Collection Time: 08/04/20  7:40 AM   Specimen: BLOOD  Result Value Ref Range Status   Specimen Description   Final    BLOOD LEFT ANTECUBITAL Performed at [facility], [address] [place], [place] [address]    Special Requests   Final  BOTTLES DRAWN AEROBIC AND ANAEROBIC Blood Culture results may not be optimal due to an excessive volume of blood received in culture bottles Performed at [facility], [address], [place], [place] [address]    Culture   Final    NO GROWTH 5 DAYS Performed at [place] [address] [place], [place] [address]    Report Status 08/09/2020 FINAL  Final  Culture, blood (routine x 2)     Status: None   Collection Time: 08/04/20  2:40 PM   Specimen: BLOOD  Result Value Ref Range Status   Specimen Description BLOOD RIGHT ANTECUBITAL  Final   Special Requests   Final    BOTTLES DRAWN AEROBIC AND ANAEROBIC Blood Culture results may not be optimal due to an inadequate volume of blood received in culture bottles   Culture   Final    NO GROWTH 5 DAYS Performed at [facility], [address], [place], [place] [address]    Report Status 08/09/2020 FINAL  Final    Coagulation Studies: No results for input(s): LABPROT, INR in the last 72 hours.  Urinalysis: No results for input(s): COLORURINE, LABSPEC, PHURINE, GLUCOSEU, HGBUR, BILIRUBINUR, KETONESUR, PROTEINUR, UROBILINOGEN, NITRITE, LEUKOCYTESUR in the last 72 hours.  Invalid input(s): APPERANCEUR    Imaging: No results found.   Medications:   . sodium chloride Stopped (08/06/20 1812)  . acyclovir  850 mg (08/09/20 1232)   . aspirin  81 mg Oral Daily  . benazepril  5 mg Oral QHS  . calcium-vitamin D  1 tablet Oral Daily  . enoxaparin (LOVENOX) injection  55 mg Subcutaneous Q24H  . gabapentin  600 mg Oral TID  . insulin aspart  0-20 Units Subcutaneous TID WC  . insulin aspart  0-5 Units Subcutaneous QHS  . insulin detemir  20 Units Subcutaneous Daily  . loratadine  10 mg Oral Daily  . multivitamin with minerals  1 tablet Oral Daily  . predniSONE  20 mg Oral Q1400  . predniSONE  40 mg Oral Q breakfast  . simvastatin  40 mg Oral Daily  . sulfamethoxazole-trimethoprim  1 tablet Oral Daily  . torsemide  20 mg Oral Daily   sodium chloride, acetaminophen **OR** acetaminophen, ondansetron **OR** ondansetron (ZOFRAN) IV, witch hazel-glycerin  Assessment/ Plan:  Mr. CHAO BLAZEJEWSKI is a 49 y.o. 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 Currently on high dose steroids.   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  Along with relative hyponatremia and metabolic alkalosis, suspect overdiuresis. Lab Results  Component Value Date   CREATININE 0.96 08/09/2020   CREATININE 0.89 08/08/2020   CREATININE 1.00 08/07/2020  Renal function improving 12/02 0701 - 12/03 0700 In: -  Out: 1900 [Urine:1900]   We will continue monitoring renal function Prednisone tapered to lower dose this admission Restart Benazepril at lower dose and titrate to BP  #Lower extremity edema Restart torsemide 20 mg daily in a.m.  # Left lower extremity rashes with nerve pain/ multidermatomal herpes zoster infection Patient reports pain and new rashes on his knee and left foot He is on iv acyclovir Management per ID team  #Steroid induced hyperglycemia Lab Results  Component Value Date   HGBA1C 7.1 (H) 08/06/2020   Blood glucose readings within acceptable range today We will continue current  antihyperglycemic management PCP prophylaxis with Bactrim   LOS: 5 Anginette Espejo NP 12/3/20211:44 PM

## 2020-08-09 NOTE — Progress Notes (Addendum)
PROGRESS NOTE    Russell Grant  DDU:[id number]  DOB: 1971/03/30  DOA: 08/04/2020 PCP: Sofie Hartigan, MD Outpatient Specialists:   Hospital course:  49 year old with HTN, FSGS, CKD 3, HL was admitted 08/04/2020 due to weakness and falls.  He was also noted to have a new painful light rash in his lower extremities.  He was noted to have a leukocytosis of 20 2K and creatinine of 1.9.  Covid was negative.  Work-up revealed new onset diabetes, disseminated herpes zoster infection and mild worsening of his renal insufficiency.   Subjective:  No new complaints.  Feels that his rash is drying up.  No new nodules are showing up.  Feels reasonably well.  Objective: Vitals:   08/08/20 2013 08/08/20 2322 08/09/20 0324 08/09/20 1157  BP: (!) 154/83 140/73 (!) 157/86 135/80  Pulse: (!) 108 100 91 98  Resp: 16 16 16 20   Temp: 98.2 F (36.8 C) 98.1 F (36.7 C) 97.6 F (36.4 C) 98.2 F (36.8 C)  TempSrc: Oral Oral Oral Oral  SpO2: 97% 97% 97% 98%  Weight:      Height:        Intake/Output Summary (Last 24 hours) at 08/09/2020 1831 Last data filed at 08/09/2020 1600 Gross per 24 hour  Intake 1169 ml  Output 1500 ml  Net -331 ml   Filed Weights   08/04/20 0101  Weight: 112 kg     Exam:  General: Obese man with petechial rash in his lower extremities lying flat in bed in no acute distress with his wife at bedside both watching TV. Eyes: sclera anicteric, conjuctiva mild injection bilaterally CVS: S1-S2, regular  Respiratory:  decreased air entry bilaterally secondary to decreased inspiratory effort, rales at bases  GI: NABS, soft, NT  LE: 2-3+ edema bilaterally.  Particular extremity rash as noted previously. Neuro: A/O x 3, Moving all extremities equally with normal strength, CN 3-12 intact, grossly nonfocal.  Psych: patient is logical and coherent, judgement and insight appear normal, mood and affect appropriate to situation.   Assessment & Plan:   AKI/nephrotic  syndrome Patient has known FSGS although AKI was thought to be overdiuresis, now improved Torsemide restarted by Dr. Candiss Norse on 08/09/2020 Patient is on prednisone 60 mg daily, will discuss tapering regimen with nephrology consultation  Disseminated herpes zoster Patient remains in house on IV acyclovir, Bactrim given immunocompromised state due to high-dose prednisone for treatment of nephrotic syndrome and disseminated zoster. Will discuss with ID whether patient can be transitioned to p.o. acyclovir and complete treatment at home Continue IV acyclovir, Bactrim, Neurontin   New onset DM 2 Unclear if this is steroid-induced or de novo diabetes Blood sugars remain in the mid 200s despite increase in detemir Will increase detemir to 23 units daily Continue SSI coverage  Generalized weakness with falls Seen by PT with no reccomendations   DVT prophylaxis: SCDs Code Status: Full Family Communication: Wife at bedside Disposition Plan:   Patient is from:  Home  Anticipated Discharge Location:  Home  Barriers to Discharge:  Hyperglycemia  Is patient medically stable for Discharge:  No   Consultants:  ID  Dermatology  Nephrology  Procedures:  Skin Biopsy  Antimicrobials:  IV Acyclovir   Data Reviewed:  Basic Metabolic Panel: Recent Labs  Lab 08/05/20 0440 08/06/20 0611 08/07/20 0623 08/08/20 0625 08/09/20 0518  NA 137 135 136 134* 139  K 4.8 4.5 4.6 4.4 4.9  CL 98 98 98 100 104  CO2 30 27  28 26 27   GLUCOSE 178* 212* 128* 127* 144*  BUN 89* 71* 58* 44* 35*  CREATININE 1.22 1.19 1.00 0.89 0.96  CALCIUM 8.5* 8.3* 8.1* 7.7* 7.8*  MG  --  2.6* 2.6* 2.6* 2.8*  PHOS  --  3.7 3.1  --  2.6   Liver Function Tests: Recent Labs  Lab 08/04/20 0740 08/06/20 0611 08/07/20 0623 08/08/20 0625 08/09/20 0518  AST 73* 80* 65* 51* 58*  ALT 305* 242* 222* 177* 164*  ALKPHOS 134* 106 100 99 108  BILITOT 0.6 0.9 0.9 0.8 0.8  PROT 6.0* 4.8* 5.1* 4.7* 4.9*  ALBUMIN 2.7*  2.2* 2.3* 2.1* 2.3*   No results for input(s): LIPASE, AMYLASE in the last 168 hours. No results for input(s): AMMONIA in the last 168 hours. CBC: Recent Labs  Lab 08/04/20 0740 08/04/20 0740 08/05/20 0440 08/06/20 0611 08/07/20 0623 08/08/20 0625 08/09/20 0518  WBC 22.2*   < > 21.0* 16.8* 17.3* 15.2* 16.8*  NEUTROABS 18.9*  --   --   --   --   --   --   HGB 11.2*   < > 10.4* 9.5* 10.1* 9.5* 10.0*  HCT 34.0*   < > 31.5* 28.3* 29.8* 27.5* 30.0*  MCV 98.0   < > 98.1 96.6 97.1 96.5 98.7  PLT 159   < > 135* 124* 134* 124* 124*   < > = values in this interval not displayed.   Cardiac Enzymes: Recent Labs  Lab 08/04/20 0740  CKTOTAL 14*   BNP (last 3 results) No results for input(s): PROBNP in the last 8760 hours. CBG: Recent Labs  Lab 08/08/20 1642 08/08/20 2020 08/09/20 0757 08/09/20 1158 08/09/20 1651  GLUCAP 256* 236* 138* 197* 250*    Recent Results (from the past 240 hour(s))  Resp Panel by RT-PCR (Flu A&B, Covid) Nasopharyngeal Swab     Status: None   Collection Time: 08/04/20  5:07 AM   Specimen: Nasopharyngeal Swab; Nasopharyngeal(NP) swabs in vial transport medium  Result Value Ref Range Status   SARS Coronavirus 2 by RT PCR NEGATIVE NEGATIVE Final    Comment: (NOTE) SARS-CoV-2 target nucleic acids are NOT DETECTED.  The SARS-CoV-2 RNA is generally detectable in upper respiratory specimens during the acute phase of infection. The lowest concentration of SARS-CoV-2 viral copies this assay can detect is 138 copies/mL. A negative result does not preclude SARS-Cov-2 infection and should not be used as the sole basis for treatment or other patient management decisions. A negative result may occur with  improper specimen collection/handling, submission of specimen other than nasopharyngeal swab, presence of viral mutation(s) within the areas targeted by this assay, and inadequate number of viral copies(<138 copies/mL). A negative result must be combined  with clinical observations, patient history, and epidemiological information. The expected result is Negative.  Fact Sheet for Patients:  EntrepreneurPulse.com.au  Fact Sheet for Healthcare Providers:  IncredibleEmployment.be  This test is no t yet approved or cleared by the [place] FDA and  has been authorized for detection and/or diagnosis of SARS-CoV-2 by FDA under an Emergency Use Authorization (EUA). This EUA will remain  in effect (meaning this test can be used) for the duration of the COVID-19 declaration under Section 564(b)(1) of the Act, 21 U.S.C.section 360bbb-3(b)(1), unless the authorization is terminated  or revoked sooner.       Influenza A by PCR NEGATIVE NEGATIVE Final   Influenza B by PCR NEGATIVE NEGATIVE Final    Comment: (NOTE) The Xpert Xpress SARS-CoV-2/FLU/RSV  plus assay is intended as an aid in the diagnosis of influenza from Nasopharyngeal swab specimens and should not be used as a sole basis for treatment. Nasal washings and aspirates are unacceptable for Xpert Xpress SARS-CoV-2/FLU/RSV testing.  Fact Sheet for Patients: EntrepreneurPulse.com.au  Fact Sheet for Healthcare Providers: IncredibleEmployment.be  This test is not yet approved or cleared by the [place] FDA and has been authorized for detection and/or diagnosis of SARS-CoV-2 by FDA under an Emergency Use Authorization (EUA). This EUA will remain in effect (meaning this test can be used) for the duration of the COVID-19 declaration under Section 564(b)(1) of the Act, 21 U.S.C. section 360bbb-3(b)(1), unless the authorization is terminated or revoked.  Performed at [facility], [address] [place], [place] [address]   Culture, blood (routine x 2)     Status: None   Collection Time: 08/04/20  7:40 AM   Specimen: BLOOD  Result Value Ref Range Status   Specimen Description   Final    BLOOD  LEFT ANTECUBITAL Performed at [facility], [address], [place], [place] [address]    Special Requests   Final    BOTTLES DRAWN AEROBIC AND ANAEROBIC Blood Culture results may not be optimal due to an excessive volume of blood received in culture bottles Performed at [facility], [address], [place], [place] [address]    Culture   Final    NO GROWTH 5 DAYS Performed at [facility], [place] [address] [place], [place] [address]    Report Status 08/09/2020 FINAL  Final  Culture, blood (routine x 2)     Status: None   Collection Time: 08/04/20  2:40 PM   Specimen: BLOOD  Result Value Ref Range Status   Specimen Description BLOOD RIGHT ANTECUBITAL  Final   Special Requests   Final    BOTTLES DRAWN AEROBIC AND ANAEROBIC Blood Culture results may not be optimal due to an inadequate volume of blood received in culture bottles   Culture   Final    NO GROWTH 5 DAYS Performed at [facility], [address] [place], [place] [address]    Report Status 08/09/2020 FINAL  Final      Studies: No results found.   Scheduled Meds: . aspirin  81 mg Oral Daily  . benazepril  5 mg Oral QHS  . calcium-vitamin D  1 tablet Oral Daily  . enoxaparin (LOVENOX) injection  55 mg Subcutaneous Q24H  . gabapentin  600 mg Oral TID  . insulin aspart  0-20 Units Subcutaneous TID WC  . insulin aspart  0-5 Units Subcutaneous QHS  . insulin detemir  20 Units Subcutaneous Daily  . loratadine  10 mg Oral Daily  . multivitamin with minerals  1 tablet Oral Daily  . predniSONE  20 mg Oral Q1400  . predniSONE  40 mg Oral Q breakfast  . simvastatin  40 mg Oral Daily  . sulfamethoxazole-trimethoprim  1 tablet Oral Daily  . torsemide  20 mg Oral Daily   Continuous Infusions: . sodium chloride Stopped (08/06/20 1812)  . acyclovir Stopped (08/09/20 1332)    Principal Problem:   AKI (acute kidney injury) ([place]) Active Problems:   Essential hypertension   CKD (chronic  kidney disease) stage 3, GFR 30-59 ml/min ([facility])   FSGS (focal segmental glomerulosclerosis), tip variant with nephrosis   Generalized weakness   Dehydration   New onset type 2 diabetes mellitus ([place])   SIRS due to infectious process with  acute organ dysfunction ([place])     Dewaine Oats Derek Jack, [facility]  If 7PM-7AM, please contact night-coverage [email or web address] Password [email or web address] 08/09/2020, 6:31 PM    LOS: 5 days

## 2020-08-09 NOTE — Plan of Care (Signed)
Continuing with plan of care. 

## 2020-08-10 LAB — GLUCOSE, CAPILLARY
Glucose-Capillary: 119 mg/dL — ABNORMAL HIGH (ref 70–99)
Glucose-Capillary: 227 mg/dL — ABNORMAL HIGH (ref 70–99)
Glucose-Capillary: 250 mg/dL — ABNORMAL HIGH (ref 70–99)
Glucose-Capillary: 310 mg/dL — ABNORMAL HIGH (ref 70–99)

## 2020-08-10 NOTE — Progress Notes (Signed)
[facility]  ROUNDING NOTE   Subjective:   [name] was admitted to [facility] on 08/04/2020 for Dehydration [E86.0] Dizziness [R42] Diuretic overdose, accidental or unintentional, initial encounter [T50.2X1A] Leukocytosis, unspecified type [D72.829] Chronic kidney disease, unspecified CKD stage [N18.9]  Patient reports feeling better, left lower extremity lesions with scabs, healing.   Objective:  Vital signs in last 24 hours:  Temp:  [97.5 F (36.4 C)-98.2 F (36.8 C)] 98.1 F (36.7 C) (12/04 1203) Pulse Rate:  [88-107] 107 (12/04 1203) Resp:  [16-18] 16 (12/04 1203) BP: (134-154)/(73-85) 141/83 (12/04 1203) SpO2:  [98 %-99 %] 98 % (12/04 1203)  Weight change:  Filed Weights   08/04/20 0101  Weight: 112 kg    Intake/Output: I/O last 3 completed shifts: In: 7989 [P.O.:120; IV Piggyback:1336] Out: 1900 [Urine:1900]       Intake/Output this shift:  No intake/output data recorded.  Physical Exam: General:  Resting in bed, appears pleasant and comfortable  Head:  Oral mucous membranes appear moist  Eyes:  Anicteric  Lungs:   Respirations symmetrical, unlabored, lungs clear  Heart:  Regular rate and rhythm  Abdomen:  Soft, nontender, nondistended  Extremities:  Trace bilateral lower extremity edema   Neurologic:  Speech clear and appropriate  Skin: Red purpuric lesions on left lower extremity from left lower back down, improving        Basic Metabolic Panel: Recent Labs  Lab 08/05/20 0440 08/05/20 0440 08/06/20 0611 08/06/20 0611 08/07/20 0623 08/08/20 0625 08/09/20 0518  NA 137  --  135  --  136 134* 139  K 4.8  --  4.5  --  4.6 4.4 4.9  CL 98  --  98  --  98 100 104  CO2 30  --  27  --  28 26 27   GLUCOSE 178*  --  212*  --  128* 127* 144*  BUN 89*  --  71*  --  58* 44* 35*  CREATININE 1.22  --  1.19  --  1.00 0.89 0.96  CALCIUM 8.5*   < > 8.3*   < > 8.1* 7.7* 7.8*  MG  --   --  2.6*  --  2.6* 2.6* 2.8*  PHOS  --   --  3.7  --   3.1  --  2.6   < > = values in this interval not displayed.    Liver Function Tests: Recent Labs  Lab 08/04/20 0740 08/06/20 0611 08/07/20 0623 08/08/20 0625 08/09/20 0518  AST 73* 80* 65* 51* 58*  ALT 305* 242* 222* 177* 164*  ALKPHOS 134* 106 100 99 108  BILITOT 0.6 0.9 0.9 0.8 0.8  PROT 6.0* 4.8* 5.1* 4.7* 4.9*  ALBUMIN 2.7* 2.2* 2.3* 2.1* 2.3*   No results for input(s): LIPASE, AMYLASE in the last 168 hours. No results for input(s): AMMONIA in the last 168 hours.  CBC: Recent Labs  Lab 08/04/20 0740 08/04/20 0740 08/05/20 0440 08/06/20 2119 08/07/20 0623 08/08/20 0625 08/09/20 0518  WBC 22.2*   < > 21.0* 16.8* 17.3* 15.2* 16.8*  NEUTROABS 18.9*  --   --   --   --   --   --   HGB 11.2*   < > 10.4* 9.5* 10.1* 9.5* 10.0*  HCT 34.0*   < > 31.5* 28.3* 29.8* 27.5* 30.0*  MCV 98.0   < > 98.1 96.6 97.1 96.5 98.7  PLT 159   < > 135* 124* 134* 124* 124*   < > =  values in this interval not displayed.    Cardiac Enzymes: Recent Labs  Lab 08/04/20 0740  CKTOTAL 14*    BNP: Invalid input(s): POCBNP  CBG: Recent Labs  Lab 08/09/20 1158 08/09/20 1651 08/09/20 2010 08/10/20 0757 08/10/20 1200  GLUCAP 197* 250* 285* 119* 227*    Microbiology: Results for orders placed or performed during the hospital encounter of 08/04/20  Resp Panel by RT-PCR (Flu A&B, Covid) Nasopharyngeal Swab     Status: None   Collection Time: 08/04/20  5:07 AM   Specimen: Nasopharyngeal Swab; Nasopharyngeal(NP) swabs in vial transport medium  Result Value Ref Range Status   SARS Coronavirus 2 by RT PCR NEGATIVE NEGATIVE Final    Comment: (NOTE) SARS-CoV-2 target nucleic acids are NOT DETECTED.  The SARS-CoV-2 RNA is generally detectable in upper respiratory specimens during the acute phase of infection. The lowest concentration of SARS-CoV-2 viral copies this assay can detect is 138 copies/mL. A negative result does not preclude SARS-Cov-2 infection and should not be used as the  sole basis for treatment or other patient management decisions. A negative result may occur with  improper specimen collection/handling, submission of specimen other than nasopharyngeal swab, presence of viral mutation(s) within the areas targeted by this assay, and inadequate number of viral copies(<138 copies/mL). A negative result must be combined with clinical observations, patient history, and epidemiological information. The expected result is Negative.  Fact Sheet for Patients:  EntrepreneurPulse.com.au  Fact Sheet for Healthcare Providers:  IncredibleEmployment.be  This test is no t yet approved or cleared by the [place] FDA and  has been authorized for detection and/or diagnosis of SARS-CoV-2 by FDA under an Emergency Use Authorization (EUA). This EUA will remain  in effect (meaning this test can be used) for the duration of the COVID-19 declaration under Section 564(b)(1) of the Act, 21 U.S.C.section 360bbb-3(b)(1), unless the authorization is terminated  or revoked sooner.       Influenza A by PCR NEGATIVE NEGATIVE Final   Influenza B by PCR NEGATIVE NEGATIVE Final    Comment: (NOTE) The Xpert Xpress SARS-CoV-2/FLU/RSV plus assay is intended as an aid in the diagnosis of influenza from Nasopharyngeal swab specimens and should not be used as a sole basis for treatment. Nasal washings and aspirates are unacceptable for Xpert Xpress SARS-CoV-2/FLU/RSV testing.  Fact Sheet for Patients: EntrepreneurPulse.com.au  Fact Sheet for Healthcare Providers: IncredibleEmployment.be  This test is not yet approved or cleared by the [place] FDA and has been authorized for detection and/or diagnosis of SARS-CoV-2 by FDA under an Emergency Use Authorization (EUA). This EUA will remain in effect (meaning this test can be used) for the duration of the COVID-19 declaration under Section 564(b)(1) of the  Act, 21 U.S.C. section 360bbb-3(b)(1), unless the authorization is terminated or revoked.  Performed at [facility], [address] [place], [place] [address]   Culture, blood (routine x 2)     Status: None   Collection Time: 08/04/20  7:40 AM   Specimen: BLOOD  Result Value Ref Range Status   Specimen Description   Final    BLOOD LEFT ANTECUBITAL Performed at [facility], [address] [place], [place] [address]    Special Requests   Final    BOTTLES DRAWN AEROBIC AND ANAEROBIC Blood Culture results may not be optimal due to an excessive volume of blood received in culture bottles Performed at [facility], [address], [place], [place] [address]    Culture   Final  NO GROWTH 5 DAYS Performed at [facility], [place] [address], [place], [place] [address]    Report Status 08/09/2020 FINAL  Final  Culture, blood (routine x 2)     Status: None   Collection Time: 08/04/20  2:40 PM   Specimen: BLOOD  Result Value Ref Range Status   Specimen Description BLOOD RIGHT ANTECUBITAL  Final   Special Requests   Final    BOTTLES DRAWN AEROBIC AND ANAEROBIC Blood Culture results may not be optimal due to an inadequate volume of blood received in culture bottles   Culture   Final    NO GROWTH 5 DAYS Performed at [facility], [address], [place], [place] [address]    Report Status 08/09/2020 FINAL  Final    Coagulation Studies: No results for input(s): LABPROT, INR in the last 72 hours.  Urinalysis: No results for input(s): COLORURINE, LABSPEC, PHURINE, GLUCOSEU, HGBUR, BILIRUBINUR, KETONESUR, PROTEINUR, UROBILINOGEN, NITRITE, LEUKOCYTESUR in the last 72 hours.  Invalid input(s): APPERANCEUR    Imaging: No results found.   Medications:   . sodium chloride Stopped (08/06/20 1812)  . acyclovir 850 mg (08/10/20 0559)   . aspirin  81 mg Oral Daily  . benazepril  5 mg Oral QHS  . calcium-vitamin D  1 tablet Oral Daily  .  enoxaparin (LOVENOX) injection  55 mg Subcutaneous Q24H  . gabapentin  600 mg Oral TID  . insulin aspart  0-20 Units Subcutaneous TID WC  . insulin aspart  0-5 Units Subcutaneous QHS  . insulin detemir  22 Units Subcutaneous Daily  . loratadine  10 mg Oral Daily  . multivitamin with minerals  1 tablet Oral Daily  . predniSONE  20 mg Oral Q1400  . predniSONE  40 mg Oral Q breakfast  . simvastatin  40 mg Oral Daily  . sulfamethoxazole-trimethoprim  1 tablet Oral Daily  . torsemide  20 mg Oral Daily   sodium chloride, acetaminophen **OR** acetaminophen, ondansetron **OR** ondansetron (ZOFRAN) IV, witch hazel-glycerin  Assessment/ Plan:  Mr. MACLAIN COHRON is a 49 y.o. 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 Currently on high dose steroids.   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  Along with relative hyponatremia and metabolic alkalosis, suspect overdiuresis. Lab Results  Component Value Date   CREATININE 0.96 08/09/2020   CREATININE 0.89 08/08/2020   CREATININE 1.00 08/07/2020  Renal function improving 12/03 0701 - 12/04 0700 In: 8676 [P.O.:120; IV Piggyback:1336] Out: 1500 [Urine:1500]   Urine output adequate We will continue monitoring renal function as outpatient if getting discharged today  #Lower extremity edema Patient is on torsemide 20 mg p.o. daily, restarted yesterday.  # Left lower extremity rashes with nerve pain/ multidermatomal herpes zoster infection Patient reports improvement in pain Lesions appear as healing Management per ID team  #Steroid induced hyperglycemia Lab Results  Component Value Date   HGBA1C 7.1 (H) 08/06/2020  Patient is on insulin aspart and insulin detemir    LOS: 6 Erasmus Bistline NP 12/4/20212:09 PM

## 2020-08-10 NOTE — Plan of Care (Signed)
Continuing with plan of care. 

## 2020-08-11 LAB — CREATININE, SERUM
Creatinine, Ser: 1.01 mg/dL (ref 0.61–1.24)
GFR, Estimated: >60 mL/min (ref >60)

## 2020-08-11 LAB — GLUCOSE, CAPILLARY: Glucose-Capillary: 133 mg/dL — ABNORMAL HIGH (ref 70–99)

## 2020-08-11 MED ORDER — LEVEMIR FLEXTOUCH 100 UNIT/ML ~~LOC~~ SOPN
22.0000 [IU] | PEN_INJECTOR | Freq: Every day | SUBCUTANEOUS | 11 refills | Status: DC
Start: 2020-08-11 — End: 2020-08-20

## 2020-08-11 MED ORDER — SULFAMETHOXAZOLE-TRIMETHOPRIM 400-80 MG PO TABS: 1.0000 | ORAL_TABLET | Freq: Every day | ORAL | 0 refills | Status: DC

## 2020-08-11 MED ORDER — VALACYCLOVIR HCL 500 MG PO TABS: 1000.0000 mg | ORAL_TABLET | Freq: Three times a day (TID) | ORAL | 0 refills | Status: DC

## 2020-08-11 MED ORDER — LIVING WELL WITH DIABETES BOOK
Freq: Once | Status: DC
  Filled 2020-08-11: qty 1

## 2020-08-11 NOTE — Plan of Care (Signed)
Discharge teaching completed with patient, including diabetic teaching, patient is in stable condition.

## 2020-08-11 NOTE — Discharge Summary (Signed)
Russell Grant PPJ:[id number] DOB: 09-29-1970 DOA: 08/04/2020  PCP: Sofie Hartigan, MD  Admit date: 08/04/2020  Discharge date: 08/11/2020  Admitted From: Home   disposition: Home   Recommendations for Outpatient Follow-up:   Follow up with PCP in 1-2 weeks Follow-up with Dr. Tama High in 10 days Follow-up with Dr. Juleen China in 7 days  Home Health: N/A Equipment/Devices: N/A Consultations: Infectious disease, dermatology, nephrology Discharge Condition: Improved CODE STATUS: Full Diet Recommendation: Heart Healthy carb controlled  Diet Order            Diet - low sodium heart healthy           Diet Carb Modified           Diet Carb Modified Fluid consistency: Thin; Room service appropriate? Yes  Diet effective now                  Chief Complaint  Patient presents with  . Dizziness     Brief history of present illness from the day of admission and additional interim summary    49 year old with HTN, FSGS, CKD 3, HL was admitted 08/04/2020 due to weakness and falls.  He was also noted to have a new painful light rash in his lower extremities.  He was noted to have a leukocytosis of 20 2K and creatinine of 1.9.  Covid was negative.  Work-up revealed new onset diabetes, disseminated herpes zoster infection and mild worsening of his renal insufficiency.  Russell Grant a 49 y.o.malewith medical history significant forhypertension, dyslipidemia, history of focal segmental glomerulosclerosis, stage III chronic kidney disease presented to ED with dizziness, weakness for several weeks with recent multiple falls.  Patient was started on metolazone on 11/17/21by his nephrologist and 1 week later(11/24)he was advised to hold the metolazone since his peripheral edema had improved significantly and  patient had complained of multiple falls at home. He was also advised to reduce his torsemide to 40 mg daily and to continue spironolactone.  He complained of dizziness, lightheadedness and dry mouth with increased frequency of urination.  In the ED, patient was noted to have elevated creatinine of 1.9 compared to baseline of 1.3 from 2 months back.  WBC was elevated at 22,000.    Respiratory viral panel was negative including Covid.  Chest x-ray without any acute findings.  Urinalysis showed some protein and hemoglobin in urine.  Patient was then admitted to hospital for worsening renal function and left lower extremity painful rash.  Nephrology was consulted and patient was admitted to the hospital.                                                                  Hospital Course    AKI/nephrotic syndrome Patient has known FSGS although AKI was thought to  be overdiuresis, now improved Patient has been restarted back on his torsemide Gust with Dr. Zollie Scale, will increase patient's torsemide back to 40 mg daily per previous doses Continue prednisone 60 mg daily Follow-up with Dr. Juleen China in 1 week  Disseminated herpes zoster Discussed with Dr. Tama High, patient is completed 5 days of IV acyclovir Will discharge patient home on Valtrex 1000 mg p.o. 3 times daily x10 days per her recommendation Patient and wife both feel his rash is much much improved. Continue Bactrim single strength 1 tablet/day due to immunocompromise state for prophylaxis  New onset DM 2 Unclear if this is steroid-induced or de novo diabetes Blood sugars improved on detemir to 23 units daily Patient given insulin teaching He is to contact his PCP tomorrow to follow his blood sugars closely and manage his insulin dosing  Generalized weakness with falls Seen by PT with no reccomendations   Discharge diagnosis     Principal Problem:   AKI (acute kidney injury) ([place]) Active Problems:   Essential hypertension    CKD (chronic kidney disease) stage 3, GFR 30-59 ml/min (HCC)   FSGS (focal segmental glomerulosclerosis), tip variant with nephrosis   Generalized weakness   Dehydration   New onset type 2 diabetes mellitus ([place])   SIRS due to infectious process with acute organ dysfunction [facility])    Discharge instructions    Discharge Instructions    Diet - low sodium heart healthy   Complete by: As directed    Diet Carb Modified   Complete by: As directed    Discharge instructions   Complete by: As directed    1. You will continue to take prednisone 60 mg a day until you see Dr. Juleen China 2. You will need to start taking Valtrex (valacyclovir) 1000 mg every 8 hours for another 10 days. 3. You will continue to take Bactrim 1 tablet daily. I am giving you a 10-day supply but you will likely need to take it for longer. Discuss this with Dr. Delaine Lame and she will give your prescription for more Bactrim. 4. You will resume taking torsemide 40 mg daily as you were taking before you came in. 5. We have started you on insulin Levemir 22 units once a day.  6. Make sure you know how to give yourself the insulin before you leave.  7. Please call and make an appointment to see your PCP tomorrow. You will need supplies to check your blood sugar. You will also need to be closely monitored since insulin is new medication for you.  8. Please call and make an appointment to see Dr. Juleen China in 1 week. 9. Please call and make an appointment with Dr. Delaine Lame in 10 days.   Increase activity slowly   Complete by: As directed       Discharge Medications   Allergies as of 08/11/2020      Reactions   Hydrochlorothiazide Rash   Cefdinir Rash      Medication List    STOP taking these medications   HYDROcodone-acetaminophen 5-325 MG tablet Commonly known as: NORCO/VICODIN   predniSONE 20 MG tablet Commonly known as: DELTASONE   traMADol 50 MG tablet Commonly known as: ULTRAM     TAKE these medications     aspirin 81 MG chewable tablet Chew 1 tablet (81 mg total) by mouth daily.   benazepril 20 MG tablet Commonly known as: LOTENSIN Take 1 tablet (20 mg total) by mouth daily.   Calcium 600-200 MG-UNIT tablet Take 1 tablet by  mouth daily. Can take any over-the-counter supplement.   Claritin 10 MG tablet Generic drug: loratadine Take 10 mg by mouth daily.   Fish Oil Extra Strength 1200 MG Caps Take 1 capsule by mouth at bedtime.   gabapentin 300 MG capsule Commonly known as: NEURONTIN Take 300 mg by mouth 3 (three) times daily.   Levemir FlexTouch 100 UNIT/ML FlexPen Generic drug: insulin detemir Inject 22 Units into the skin daily.   MULTIVITAMIN ADULTS PO Take 1 tablet by mouth daily.   simvastatin 40 MG tablet Commonly known as: ZOCOR Take 1 tablet (40 mg total) by mouth daily.   spironolactone 25 MG tablet Commonly known as: ALDACTONE Take 25 mg by mouth daily.   sulfamethoxazole-trimethoprim 400-80 MG tablet Commonly known as: BACTRIM Take 1 tablet by mouth daily for 10 days.   torsemide 20 MG tablet Commonly known as: Demadex Take 2 tablets (40 mg total) by mouth 2 (two) times daily. Fluid pill.   valACYclovir 500 MG tablet Commonly known as: Valtrex Take 2 tablets (1,000 mg total) by mouth 3 (three) times daily for 10 days.         Major procedures and Radiology Reports - PLEASE review detailed and final reports thoroughly  -     DG Chest Port 1 View  Result Date: 08/04/2020 CLINICAL DATA:  Dizziness and weakness.  Falls. EXAM: PORTABLE CHEST 1 VIEW COMPARISON:  05/06/2020 FINDINGS: Shallow inspiration. Heart size and pulmonary vascularity are normal. Lungs are clear. No pleural effusions. No pneumothorax. Postoperative changes in the cervical spine. IMPRESSION: No active disease. Electronically Signed   By: Lucienne Capers M.D.   On: 08/04/2020 05:23    Micro Results   Recent Results (from the past 240 hour(s))  Resp Panel by RT-PCR (Flu A&B,  Covid) Nasopharyngeal Swab     Status: None   Collection Time: 08/04/20  5:07 AM   Specimen: Nasopharyngeal Swab; Nasopharyngeal(NP) swabs in vial transport medium  Result Value Ref Range Status   SARS Coronavirus 2 by RT PCR NEGATIVE NEGATIVE Final    Comment: (NOTE) SARS-CoV-2 target nucleic acids are NOT DETECTED.  The SARS-CoV-2 RNA is generally detectable in upper respiratory specimens during the acute phase of infection. The lowest concentration of SARS-CoV-2 viral copies this assay can detect is 138 copies/mL. A negative result does not preclude SARS-Cov-2 infection and should not be used as the sole basis for treatment or other patient management decisions. A negative result may occur with  improper specimen collection/handling, submission of specimen other than nasopharyngeal swab, presence of viral mutation(s) within the areas targeted by this assay, and inadequate number of viral copies(<138 copies/mL). A negative result must be combined with clinical observations, patient history, and epidemiological information. The expected result is Negative.  Fact Sheet for Patients:  EntrepreneurPulse.com.au  Fact Sheet for Healthcare Providers:  IncredibleEmployment.be  This test is no t yet approved or cleared by the [place] FDA and  has been authorized for detection and/or diagnosis of SARS-CoV-2 by FDA under an Emergency Use Authorization (EUA). This EUA will remain  in effect (meaning this test can be used) for the duration of the COVID-19 declaration under Section 564(b)(1) of the Act, 21 U.S.C.section 360bbb-3(b)(1), unless the authorization is terminated  or revoked sooner.       Influenza A by PCR NEGATIVE NEGATIVE Final   Influenza B by PCR NEGATIVE NEGATIVE Final    Comment: (NOTE) The Xpert Xpress SARS-CoV-2/FLU/RSV plus assay is intended as an aid in the diagnosis of  influenza from Nasopharyngeal swab specimens and should  not be used as a sole basis for treatment. Nasal washings and aspirates are unacceptable for Xpert Xpress SARS-CoV-2/FLU/RSV testing.  Fact Sheet for Patients: EntrepreneurPulse.com.au  Fact Sheet for Healthcare Providers: IncredibleEmployment.be  This test is not yet approved or cleared by the [place] FDA and has been authorized for detection and/or diagnosis of SARS-CoV-2 by FDA under an Emergency Use Authorization (EUA). This EUA will remain in effect (meaning this test can be used) for the duration of the COVID-19 declaration under Section 564(b)(1) of the Act, 21 U.S.C. section 360bbb-3(b)(1), unless the authorization is terminated or revoked.  Performed at [facility], [address] [place], [place] [address]   Culture, blood (routine x 2)     Status: None   Collection Time: 08/04/20  7:40 AM   Specimen: BLOOD  Result Value Ref Range Status   Specimen Description   Final    BLOOD LEFT ANTECUBITAL Performed at [facility], [address] [place], [place] [address]    Special Requests   Final    BOTTLES DRAWN AEROBIC AND ANAEROBIC Blood Culture results may not be optimal due to an excessive volume of blood received in culture bottles Performed at [facility], [address], [place], [place] [address]    Culture   Final    NO GROWTH 5 DAYS Performed at [facility], [place] [address] [place], [place] [address]    Report Status 08/09/2020 FINAL  Final  Culture, blood (routine x 2)     Status: None   Collection Time: 08/04/20  2:40 PM   Specimen: BLOOD  Result Value Ref Range Status   Specimen Description BLOOD RIGHT ANTECUBITAL  Final   Special Requests   Final    BOTTLES DRAWN AEROBIC AND ANAEROBIC Blood Culture results may not be optimal due to an inadequate volume of blood received in culture bottles   Culture   Final    NO GROWTH 5 DAYS Performed at [facility], [address] [place], [place] [address]    Report Status 08/09/2020 FINAL  Final    Today   Subjective    Russell Grant feels much improved since admission.  Feels ready to go home.  Denies chest pain, shortness of breath or abdominal pain.  Feels they can take care of themselves with the resources they have at home.  Objective   Blood pressure 131/77, pulse 92, temperature (!) 97.5 F (36.4 C), temperature source Oral, resp. rate 16, height 5\' 10"  (1.778 m), weight 112 kg, SpO2 99 %.   Intake/Output Summary (Last 24 hours) at 08/11/2020 1734 Last data filed at 08/11/2020 9892 Gross per 24 hour  Intake 499.25 ml  Output --  Net 499.25 ml    Exam General: Patient appears well and in good spirits sitting up in bed in no acute distress.  Eyes: sclera anicteric, conjuctiva mild injection bilaterally CVS: S1-S2, regular  Respiratory:  decreased air entry bilaterally secondary to decreased inspiratory effort, rales at bases  GI: NABS, soft, NT  LE: No edema.  Neuro: A/O x 3, Moving all extremities equally with normal strength, CN 3-12 intact, grossly nonfocal.  Psych: patient is logical and coherent, judgement and insight appear normal, mood and affect appropriate to situation.    Data Review   CBC w Diff:  Lab Results  Component Value Date   WBC 16.8 (H) 08/09/2020   HGB 10.0 (L) 08/09/2020   HGB  15.4 11/28/2019   HCT 30.0 (L) 08/09/2020   HCT 45.4 11/28/2019   PLT 124 (L) 08/09/2020   PLT 185 11/28/2019   LYMPHOPCT 5 08/04/2020   MONOPCT 4 08/04/2020   EOSPCT 0 08/04/2020   BASOPCT 0 08/04/2020    CMP:  Lab Results  Component Value Date   NA 139 08/09/2020   NA 143 11/28/2019   K 4.9 08/09/2020   CL 104 08/09/2020   CO2 27 08/09/2020   BUN 35 (H) 08/09/2020   BUN 14 11/28/2019   CREATININE 1.01 08/11/2020   PROT 4.9 (L) 08/09/2020   PROT 7.3 11/28/2019   ALBUMIN 2.3 (L) 08/09/2020   ALBUMIN 4.7 11/28/2019   BILITOT 0.8 08/09/2020   BILITOT 0.6  11/28/2019   ALKPHOS 108 08/09/2020   AST 58 (H) 08/09/2020   AST 40 (H) 03/16/2017   ALT 164 (H) 08/09/2020   ALT 35 03/16/2017  .   Total Time in preparing paper work, data evaluation and todays exam - 35 minutes  Vashti Hey M.D on 08/11/2020 at 5:34 PM  [facility]   Office  [phone]

## 2020-08-11 NOTE — Progress Notes (Signed)
PROGRESS NOTE    Russell Grant  OZH:[id number]  DOB: Oct 14, 1970  DOA: 08/04/2020 PCP: Sofie Hartigan, MD Outpatient Specialists:   Hospital course:  49 year old with HTN, FSGS, CKD 3, HL was admitted 08/04/2020 due to weakness and falls.  He was also noted to have a new painful light rash in his lower extremities.  He was noted to have a leukocytosis of 20 2K and creatinine of 1.9.  Covid was negative.  Work-up revealed new onset diabetes, disseminated herpes zoster infection and mild worsening of his renal insufficiency.   Subjective:  Patient feels well.  Is hoping to go home.  Notes he is tired of being in the room.  Feels his rash is much improved.   Objective: Vitals:   08/10/20 1708 08/10/20 1734 08/10/20 2020 08/11/20 0807  BP: (!) 143/82 (!) 143/76 (!) 155/76 131/77  Pulse: (!) 112 (!) 106 (!) 105 92  Resp: 16 16 20 16   Temp: 98.1 F (36.7 C) 98.1 F (36.7 C) 98 F (36.7 C) (!) 97.5 F (36.4 C)  TempSrc: Oral Oral Oral Oral  SpO2: 99% 100% 97% 99%  Weight:      Height:        Intake/Output Summary (Last 24 hours) at 08/11/2020 1726 Last data filed at 08/11/2020 6295 Gross per 24 hour  Intake 499.25 ml  Output --  Net 499.25 ml   Filed Weights   08/04/20 0101  Weight: 112 kg     Exam:  General: Obese man with petechial rash in his lower extremities lying flat in bed in no acute distress with his wife at bedside both watching TV. Eyes: sclera anicteric, conjuctiva mild injection bilaterally CVS: S1-S2, regular  Respiratory:  decreased air entry bilaterally secondary to decreased inspiratory effort, rales at bases  GI: NABS, soft, NT  LE: 2-3+ edema bilaterally.  Particular extremity rash as noted previously. Neuro: A/O x 3, Moving all extremities equally with normal strength, CN 3-12 intact, grossly nonfocal.  Psych: patient is logical and coherent, judgement and insight appear normal, mood and affect appropriate to  situation.   Assessment & Plan:   AKI/nephrotic syndrome Patient has known FSGS although AKI was thought to be overdiuresis, now improved Torsemide restarted by Dr. Candiss Norse on 08/09/2020 Patient is on prednisone 60 mg daily, down from 80 mg at baseline.  Disseminated herpes zoster Patient remains in house on IV acyclovir, Bactrim given immunocompromised state due to high-dose prednisone for treatment of nephrotic syndrome and disseminated zoster. Will discuss with ID whether patient can be transitioned to p.o. acyclovir and complete treatment at home Continue IV acyclovir, Bactrim, Neurontin   New onset DM 2 Blood sugars under improved control on increased detemir 23 unit Not change insulin dosing at this time Patient will need follow-up with his PCP to manage his sugars Unclear if this is secondary to long-term prednisone use versus de novo  Generalized weakness with falls Seen by PT with no reccomendations    DVT prophylaxis: SCD Code Status: Full Family Communication: Wife is at bedside throughout Disposition Plan:   Patient is from: Home  Anticipated Discharge Location: Home  Barriers to Discharge: Needs IV acyclovir given immunocompromise state and disseminated zoster  Is patient medically stable for Discharge: Possibly tomorrow   Consultants:  ID  Dermatology  Follow  Procedures:  Punch biopsy of skin  Antimicrobials:  IV acyclovir   Data Reviewed:  Basic Metabolic Panel: Recent Labs  Lab 08/05/20 0440 08/05/20 0440 08/06/20 2841 08/07/20 3244  08/08/20 0625 08/09/20 0518 08/11/20 0329  NA 137  --  135 136 134* 139  --   K 4.8  --  4.5 4.6 4.4 4.9  --   CL 98  --  98 98 100 104  --   CO2 30  --  27 28 26 27   --   GLUCOSE 178*  --  212* 128* 127* 144*  --   BUN 89*  --  71* 58* 44* 35*  --   CREATININE 1.22   < > 1.19 1.00 0.89 0.96 1.01  CALCIUM 8.5*  --  8.3* 8.1* 7.7* 7.8*  --   MG  --   --  2.6* 2.6* 2.6* 2.8*  --   PHOS  --   --  3.7  3.1  --  2.6  --    < > = values in this interval not displayed.   Liver Function Tests: Recent Labs  Lab 08/06/20 0611 08/07/20 0623 08/08/20 0625 08/09/20 0518  AST 80* 65* 51* 58*  ALT 242* 222* 177* 164*  ALKPHOS 106 100 99 108  BILITOT 0.9 0.9 0.8 0.8  PROT 4.8* 5.1* 4.7* 4.9*  ALBUMIN 2.2* 2.3* 2.1* 2.3*   No results for input(s): LIPASE, AMYLASE in the last 168 hours. No results for input(s): AMMONIA in the last 168 hours. CBC: Recent Labs  Lab 08/05/20 0440 08/06/20 0611 08/07/20 0623 08/08/20 0625 08/09/20 0518  WBC 21.0* 16.8* 17.3* 15.2* 16.8*  HGB 10.4* 9.5* 10.1* 9.5* 10.0*  HCT 31.5* 28.3* 29.8* 27.5* 30.0*  MCV 98.1 96.6 97.1 96.5 98.7  PLT 135* 124* 134* 124* 124*   Cardiac Enzymes: No results for input(s): CKTOTAL, CKMB, CKMBINDEX, TROPONINI in the last 168 hours. BNP (last 3 results) No results for input(s): PROBNP in the last 8760 hours. CBG: Recent Labs  Lab 08/10/20 0757 08/10/20 1200 08/10/20 1706 08/10/20 2025 08/11/20 0808  GLUCAP 119* 227* 250* 310* 133*    Recent Results (from the past 240 hour(s))  Resp Panel by RT-PCR (Flu A&B, Covid) Nasopharyngeal Swab     Status: None   Collection Time: 08/04/20  5:07 AM   Specimen: Nasopharyngeal Swab; Nasopharyngeal(NP) swabs in vial transport medium  Result Value Ref Range Status   SARS Coronavirus 2 by RT PCR NEGATIVE NEGATIVE Final    Comment: (NOTE) SARS-CoV-2 target nucleic acids are NOT DETECTED.  The SARS-CoV-2 RNA is generally detectable in upper respiratory specimens during the acute phase of infection. The lowest concentration of SARS-CoV-2 viral copies this assay can detect is 138 copies/mL. A negative result does not preclude SARS-Cov-2 infection and should not be used as the sole basis for treatment or other patient management decisions. A negative result may occur with  improper specimen collection/handling, submission of specimen other than nasopharyngeal swab, presence of  viral mutation(s) within the areas targeted by this assay, and inadequate number of viral copies(<138 copies/mL). A negative result must be combined with clinical observations, patient history, and epidemiological information. The expected result is Negative.  Fact Sheet for Patients:  EntrepreneurPulse.com.au  Fact Sheet for Healthcare Providers:  IncredibleEmployment.be  This test is no t yet approved or cleared by the [place] FDA and  has been authorized for detection and/or diagnosis of SARS-CoV-2 by FDA under an Emergency Use Authorization (EUA). This EUA will remain  in effect (meaning this test can be used) for the duration of the COVID-19 declaration under Section 564(b)(1) of the Act, 21 U.S.C.section 360bbb-3(b)(1), unless the authorization is terminated  or revoked sooner.       Influenza A by PCR NEGATIVE NEGATIVE Final   Influenza B by PCR NEGATIVE NEGATIVE Final    Comment: (NOTE) The Xpert Xpress SARS-CoV-2/FLU/RSV plus assay is intended as an aid in the diagnosis of influenza from Nasopharyngeal swab specimens and should not be used as a sole basis for treatment. Nasal washings and aspirates are unacceptable for Xpert Xpress SARS-CoV-2/FLU/RSV testing.  Fact Sheet for Patients: EntrepreneurPulse.com.au  Fact Sheet for Healthcare Providers: IncredibleEmployment.be  This test is not yet approved or cleared by the [place] FDA and has been authorized for detection and/or diagnosis of SARS-CoV-2 by FDA under an Emergency Use Authorization (EUA). This EUA will remain in effect (meaning this test can be used) for the duration of the COVID-19 declaration under Section 564(b)(1) of the Act, 21 U.S.C. section 360bbb-3(b)(1), unless the authorization is terminated or revoked.  Performed at [facility], [address] [place], [place] [address]   Culture, blood (routine x  2)     Status: None   Collection Time: 08/04/20  7:40 AM   Specimen: BLOOD  Result Value Ref Range Status   Specimen Description   Final    BLOOD LEFT ANTECUBITAL Performed at [facility], [address] [place], [place] [address]    Special Requests   Final    BOTTLES DRAWN AEROBIC AND ANAEROBIC Blood Culture results may not be optimal due to an excessive volume of blood received in culture bottles Performed at [facility], [address] [place], [place] [address]    Culture   Final    NO GROWTH 5 DAYS Performed at [facility], [place] [address] [place], [place] [address]    Report Status 08/09/2020 FINAL  Final  Culture, blood (routine x 2)     Status: None   Collection Time: 08/04/20  2:40 PM   Specimen: BLOOD  Result Value Ref Range Status   Specimen Description BLOOD RIGHT ANTECUBITAL  Final   Special Requests   Final    BOTTLES DRAWN AEROBIC AND ANAEROBIC Blood Culture results may not be optimal due to an inadequate volume of blood received in culture bottles   Culture   Final    NO GROWTH 5 DAYS Performed at [facility], [address], [place],  [address]    Report Status 08/09/2020 FINAL  Final      Studies: No results found.   Scheduled Meds: . aspirin  81 mg Oral Daily  . benazepril  5 mg Oral QHS  . calcium-vitamin D  1 tablet Oral Daily  . enoxaparin (LOVENOX) injection  55 mg Subcutaneous Q24H  . gabapentin  600 mg Oral TID  . insulin aspart  0-20 Units Subcutaneous TID WC  . insulin aspart  0-5 Units Subcutaneous QHS  . insulin detemir  22 Units Subcutaneous Daily  . living well with diabetes book   Does not apply Once  . loratadine  10 mg Oral Daily  . multivitamin with minerals  1 tablet Oral Daily  . predniSONE  20 mg Oral Q1400  . predniSONE  40 mg Oral Q breakfast  . simvastatin  40 mg Oral Daily  . sulfamethoxazole-trimethoprim  1 tablet Oral Daily  . torsemide  20 mg Oral Daily   Continuous  Infusions: . sodium chloride Stopped (08/06/20 1812)  . acyclovir Stopped (08/10/20 2202)    Principal Problem:   AKI (acute kidney injury) ([place]) Active Problems:   Essential  hypertension   CKD (chronic kidney disease) stage 3, GFR 30-59 ml/min (HCC)   FSGS (focal segmental glomerulosclerosis), tip variant with nephrosis   Generalized weakness   Dehydration   New onset type 2 diabetes mellitus ([place])   SIRS due to infectious process with acute organ dysfunction ([place])     Dimitri Dsouza Derek Jack, [facility]  If 7PM-7AM, please contact night-coverage [email or web address] Password [email or web address] 08/11/2020, 5:26 PM    LOS: 7 days

## 2020-08-11 NOTE — TOC Initial Note (Signed)
Transition of Care [facility]) - Initial/Assessment Note    Patient Details  Name: Russell Grant MRN: [id number] Date of Birth: June 12, 1971  [facility]) CM/SW Contact:    Harriet Masson, RN Phone Number:[phone] 08/11/2020, 1:18 PM  Clinical Narrative:                 [facility] RN received request for DME for a diabetic kit for pt with a new diagnosis. Spoke with pt who indicated he could not afford the recommended supplies at this time. RN provided pt with a diabetic kit with supplies and requested teachings from the floor nurse prior to pt's discharge. No further needs at this time.  TOC team will continue to be available for discharge needs accordingly.    Expected Discharge Plan: Home/Self Care Barriers to Discharge: No Barriers Identified   Patient Goals and CMS Choice        Expected Discharge Plan and Services Expected Discharge Plan: Home/Self Care In-house Referral: Clinical Social Work     Living arrangements for the past 2 months: Single Family Home Expected Discharge Date: 08/11/20               DME Arranged: Diabetic Supplies DME Agency:  ([place] donated item) Date DME Agency Contacted: 08/11/20 Time DME Agency Contacted: [phone]              Prior Living Arrangements/Services Living arrangements for the past 2 months: Single Family Home Lives with:: Spouse Patient language and need for interpreter reviewed:: Yes Do you feel safe going back to the place where you live?: Yes      Need for Family Participation in Patient Care: Yes (Comment) Care giver support system in place?: Yes (comment)   Criminal Activity/Legal Involvement Pertinent to Current Situation/Hospitalization: No - Comment as needed  Activities of Daily Living Home Assistive Devices/Equipment: None ADL Screening (condition at time of admission) Patient's cognitive ability adequate to safely complete daily activities?: Yes Is the patient deaf or have difficulty hearing?:  No Does the patient have difficulty seeing, even when wearing glasses/contacts?: Yes (wears glasses) Does the patient have difficulty concentrating, remembering, or making decisions?: No Patient able to express need for assistance with ADLs?: Yes Does the patient have difficulty dressing or bathing?: No Independently performs ADLs?: Yes (appropriate for developmental age) Does the patient have difficulty walking or climbing stairs?: No Weakness of Legs: Both Weakness of Arms/Hands: Both  Permission Sought/Granted   Permission granted to share information with : Yes, Verbal Permission Granted              Emotional Assessment Appearance:: Appears stated age Attitude/Demeanor/Rapport: Engaged Affect (typically observed): Accepting Orientation: : Oriented to Self, Oriented to  Time, Oriented to Situation   Psych Involvement: No (comment)  Admission diagnosis:  Dehydration [E86.0] Dizziness [R42] Diuretic overdose, accidental or unintentional, initial encounter [T50.2X1A] Leukocytosis, unspecified type [D72.829] Chronic kidney disease, unspecified CKD stage [N18.9] Patient Active Problem List   Diagnosis Date Noted  . Generalized weakness 08/04/2020  . Dehydration 08/04/2020  . New onset type 2 diabetes mellitus ([place]) 08/04/2020  . SIRS due to infectious process with acute organ dysfunction ([place]) 08/04/2020  . FSGS (focal segmental glomerulosclerosis), tip variant with nephrosis   . Nephrotic syndrome 05/09/2020  . AKI (acute kidney injury) ([place]) 05/07/2020  . Anasarca 05/07/2020  . CKD (chronic kidney disease) stage 3, GFR 30-59 ml/min (HCC) 03/30/2019  . Hyperuricemia 03/30/2019  . History of gout 03/30/2019  . IFG (impaired fasting glucose)  03/30/2019  . Personal history of other malignant neoplasm of skin 09/10/2017  . SCC (squamous cell carcinoma), hand 08/24/2017  . Class 2 severe obesity due to excess calories with serious comorbidity and body mass index (BMI) of  38.0 to 38.9 in adult ([place]) 08/24/2017  . Hypertriglyceridemia   . Essential hypertension    PCP:  Sofie Hartigan, MD Pharmacy:   [id number] [place], [id number]: [phone]: [phone]    Social Determinants of Health (SDOH) Interventions    Readmission Risk Interventions Readmission Risk Prevention Plan 08/06/2020  Transportation Screening Complete  [place]  Palliative Care Screening Not Applicable  Medication Review (RN Care Manager) Complete  Some recent data might be hidden

## 2020-08-11 NOTE — Progress Notes (Addendum)
[facility]  ROUNDING NOTE   Subjective:   [name] was admitted to [facility] on 08/04/2020 for Dehydration [E86.0] Dizziness [R42] Diuretic overdose, accidental or unintentional, initial encounter [T50.2X1A] Leukocytosis, unspecified type [D72.829] Chronic kidney disease, unspecified CKD stage [N18.9]  Patient appears pleasant and comfortable today, in no acute pain or distress.Left leg lesions healing progressively. Primary team planning for discharge later today.   Objective:  Vital signs in last 24 hours:  Temp:  [97.5 F (36.4 C)-98.1 F (36.7 C)] 97.5 F (36.4 C) (12/05 0807) Pulse Rate:  [92-112] 92 (12/05 0807) Resp:  [16-20] 16 (12/05 0807) BP: (131-155)/(76-82) 131/77 (12/05 0807) SpO2:  [97 %-100 %] 99 % (12/05 0807)  Weight change:  Filed Weights   08/04/20 0101  Weight: 112 kg    Intake/Output: I/O last 3 completed shifts: In: 786.3 [P.O.:120; IV Piggyback:666.3] Out: 800 [Urine:800]       Intake/Output this shift:  No intake/output data recorded.  Physical Exam: General:  Resting in bed, in no acute distress  Head:  Normocephalic,atraumatic  Eyes:  Sclerae and conjunctivae clear  Lungs:   lungs clear, normal respiratory effort  Heart:  S1S2, no rubs or gallops  Abdomen:  Soft, nontender, nondistended  Extremities:  1+ bilateral lower extremity edema   Neurologic:  Speech clear and appropriate  Skin: Red purpuric lesions on left lower extremity from left lower back down, healing, with scabs now        Basic Metabolic Panel: Recent Labs  Lab 08/05/20 0440 08/05/20 0440 08/06/20 0611 08/06/20 0611 08/07/20 0623 08/08/20 0625 08/09/20 0518 08/11/20 0329  NA 137  --  135  --  136 134* 139  --   K 4.8  --  4.5  --  4.6 4.4 4.9  --   CL 98  --  98  --  98 100 104  --   CO2 30  --  27  --  28 26 27   --   GLUCOSE 178*  --  212*  --  128* 127* 144*  --   BUN 89*  --  71*  --  58* 44* 35*  --   CREATININE 1.22   < > 1.19   --  1.00 0.89 0.96 1.01  CALCIUM 8.5*   < > 8.3*   < > 8.1* 7.7* 7.8*  --   MG  --   --  2.6*  --  2.6* 2.6* 2.8*  --   PHOS  --   --  3.7  --  3.1  --  2.6  --    < > = values in this interval not displayed.    Liver Function Tests: Recent Labs  Lab 08/06/20 0611 08/07/20 0623 08/08/20 0625 08/09/20 0518  AST 80* 65* 51* 58*  ALT 242* 222* 177* 164*  ALKPHOS 106 100 99 108  BILITOT 0.9 0.9 0.8 0.8  PROT 4.8* 5.1* 4.7* 4.9*  ALBUMIN 2.2* 2.3* 2.1* 2.3*   No results for input(s): LIPASE, AMYLASE in the last 168 hours. No results for input(s): AMMONIA in the last 168 hours.  CBC: Recent Labs  Lab 08/05/20 0440 08/06/20 0611 08/07/20 0623 08/08/20 0625 08/09/20 0518  WBC 21.0* 16.8* 17.3* 15.2* 16.8*  HGB 10.4* 9.5* 10.1* 9.5* 10.0*  HCT 31.5* 28.3* 29.8* 27.5* 30.0*  MCV 98.1 96.6 97.1 96.5 98.7  PLT 135* 124* 134* 124* 124*    Cardiac Enzymes: No results for input(s): CKTOTAL, CKMB, CKMBINDEX, TROPONINI in the last  168 hours.  BNP: Invalid input(s): POCBNP  CBG: Recent Labs  Lab 08/10/20 0757 08/10/20 1200 08/10/20 1706 08/10/20 2025 08/11/20 0808  GLUCAP 119* 227* 250* 310* 133*    Microbiology: Results for orders placed or performed during the hospital encounter of 08/04/20  Resp Panel by RT-PCR (Flu A&B, Covid) Nasopharyngeal Swab     Status: None   Collection Time: 08/04/20  5:07 AM   Specimen: Nasopharyngeal Swab; Nasopharyngeal(NP) swabs in vial transport medium  Result Value Ref Range Status   SARS Coronavirus 2 by RT PCR NEGATIVE NEGATIVE Final    Comment: (NOTE) SARS-CoV-2 target nucleic acids are NOT DETECTED.  The SARS-CoV-2 RNA is generally detectable in upper respiratory specimens during the acute phase of infection. The lowest concentration of SARS-CoV-2 viral copies this assay can detect is 138 copies/mL. A negative result does not preclude SARS-Cov-2 infection and should not be used as the sole basis for treatment or other patient  management decisions. A negative result may occur with  improper specimen collection/handling, submission of specimen other than nasopharyngeal swab, presence of viral mutation(s) within the areas targeted by this assay, and inadequate number of viral copies(<138 copies/mL). A negative result must be combined with clinical observations, patient history, and epidemiological information. The expected result is Negative.  Fact Sheet for Patients:  EntrepreneurPulse.com.au  Fact Sheet for Healthcare Providers:  IncredibleEmployment.be  This test is no t yet approved or cleared by the [place] FDA and  has been authorized for detection and/or diagnosis of SARS-CoV-2 by FDA under an Emergency Use Authorization (EUA). This EUA will remain  in effect (meaning this test can be used) for the duration of the COVID-19 declaration under Section 564(b)(1) of the Act, 21 U.S.C.section 360bbb-3(b)(1), unless the authorization is terminated  or revoked sooner.       Influenza A by PCR NEGATIVE NEGATIVE Final   Influenza B by PCR NEGATIVE NEGATIVE Final    Comment: (NOTE) The Xpert Xpress SARS-CoV-2/FLU/RSV plus assay is intended as an aid in the diagnosis of influenza from Nasopharyngeal swab specimens and should not be used as a sole basis for treatment. Nasal washings and aspirates are unacceptable for Xpert Xpress SARS-CoV-2/FLU/RSV testing.  Fact Sheet for Patients: EntrepreneurPulse.com.au  Fact Sheet for Healthcare Providers: IncredibleEmployment.be  This test is not yet approved or cleared by the [place] FDA and has been authorized for detection and/or diagnosis of SARS-CoV-2 by FDA under an Emergency Use Authorization (EUA). This EUA will remain in effect (meaning this test can be used) for the duration of the COVID-19 declaration under Section 564(b)(1) of the Act, 21 U.S.C. section 360bbb-3(b)(1),  unless the authorization is terminated or revoked.  Performed at [facility], [address] [place], [place] [address]   Culture, blood (routine x 2)     Status: None   Collection Time: 08/04/20  7:40 AM   Specimen: BLOOD  Result Value Ref Range Status   Specimen Description   Final    BLOOD LEFT ANTECUBITAL Performed at [facility], [address], [place], [place] [address]    Special Requests   Final    BOTTLES DRAWN AEROBIC AND ANAEROBIC Blood Culture results may not be optimal due to an excessive volume of blood received in culture bottles Performed at [facility], [address], [place], [place] [address]    Culture   Final    NO GROWTH 5 DAYS Performed at [facility], [address], [place], [place] [address]  Report Status 08/09/2020 FINAL  Final  Culture, blood (routine x 2)     Status: None   Collection Time: 08/04/20  2:40 PM   Specimen: BLOOD  Result Value Ref Range Status   Specimen Description BLOOD RIGHT ANTECUBITAL  Final   Special Requests   Final    BOTTLES DRAWN AEROBIC AND ANAEROBIC Blood Culture results may not be optimal due to an inadequate volume of blood received in culture bottles   Culture   Final    NO GROWTH 5 DAYS Performed at [facility], [address], [place], [place] [address]    Report Status 08/09/2020 FINAL  Final    Coagulation Studies: No results for input(s): LABPROT, INR in the last 72 hours.  Urinalysis: No results for input(s): COLORURINE, LABSPEC, PHURINE, GLUCOSEU, HGBUR, BILIRUBINUR, KETONESUR, PROTEINUR, UROBILINOGEN, NITRITE, LEUKOCYTESUR in the last 72 hours.  Invalid input(s): APPERANCEUR    Imaging: No results found.   Medications:   . sodium chloride Stopped (08/06/20 1812)  . acyclovir Stopped (08/10/20 2202)   . aspirin  81 mg Oral Daily  . benazepril  5 mg Oral QHS  . calcium-vitamin D  1 tablet Oral Daily  . enoxaparin (LOVENOX) injection  55 mg  Subcutaneous Q24H  . gabapentin  600 mg Oral TID  . insulin aspart  0-20 Units Subcutaneous TID WC  . insulin aspart  0-5 Units Subcutaneous QHS  . insulin detemir  22 Units Subcutaneous Daily  . living well with diabetes book   Does not apply Once  . loratadine  10 mg Oral Daily  . multivitamin with minerals  1 tablet Oral Daily  . predniSONE  20 mg Oral Q1400  . predniSONE  40 mg Oral Q breakfast  . simvastatin  40 mg Oral Daily  . sulfamethoxazole-trimethoprim  1 tablet Oral Daily  . torsemide  20 mg Oral Daily   sodium chloride, acetaminophen **OR** acetaminophen, ondansetron **OR** ondansetron (ZOFRAN) IV, witch hazel-glycerin  Assessment/ Plan:  [name] is a 49 y.o. 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 Currently on high dose steroids.   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  Along with relative hyponatremia and metabolic alkalosis, suspect overdiuresis. Lab Results  Component Value Date   CREATININE 1.01 08/11/2020   CREATININE 0.96 08/09/2020   CREATININE 0.89 08/08/2020  Renal function improving 12/04 0701 - 12/05 0700 In: 499.3 [IV Piggyback:499.3] Out: -   Renal function stays stable  Can resume follow up as outpatient by Nephrology, if getting discharged today  #Lower extremity edema Continue Torsemide PO  # Left lower extremity rashes with nerve pain/ multidermatomal herpes zoster infection Patient is on Acyclovir Continue management per ID team  #Steroid induced hyperglycemia Lab Results  Component Value Date   HGBA1C 7.1 (H) 08/06/2020  Blood glucose readings within acceptable range this morning  I saw and evaluated the patient and discussed the care with Crosby Oyster, DNP.  I agree with the findings and plan as documented in the note.  Patient to continue on steroids in the form of prednisone 60 mg daily for treatment of  his FSGS.  May need to consider alternative forms of immunosuppression as an outpatient but defer to my partner Dr. Juleen China to discus this further as an outpatient.   Somaya Grassi Holley Raring , [facility] 12/5/20215:35 PM      LOS: 7 Crosby Oyster NP  12/5/20213:16 PM

## 2020-08-14 LAB — VIRUS CULTURE

## 2020-08-19 NOTE — Addendum Note (Signed)
Addended by: Brendolyn Patty on: 08/19/2020 05:49 PM   Modules accepted: Level of Service

## 2020-08-19 NOTE — Addendum Note (Signed)
Addended by: Brendolyn Patty on: 08/19/2020 06:15 PM   Modules accepted: Level of Service

## 2020-08-20 ENCOUNTER — Ambulatory Visit: Payer: BC Managed Care – PPO | Attending: Infectious Diseases | Admitting: Infectious Diseases

## 2020-08-20 ENCOUNTER — Encounter: Payer: Self-pay | Admitting: Infectious Diseases

## 2020-08-20 ENCOUNTER — Other Ambulatory Visit: Payer: Self-pay

## 2020-08-20 VITALS — BP 93/61 | HR 85 | Temp 98.1°F | Resp 16 | Ht 70.0 in | Wt 273.0 lb

## 2020-08-20 DIAGNOSIS — N183 Chronic kidney disease, stage 3 unspecified: Secondary | ICD-10-CM | POA: Insufficient documentation

## 2020-08-20 DIAGNOSIS — B029 Zoster without complications: Secondary | ICD-10-CM | POA: Insufficient documentation

## 2020-08-20 DIAGNOSIS — Z09 Encounter for follow-up examination after completed treatment for conditions other than malignant neoplasm: Secondary | ICD-10-CM | POA: Insufficient documentation

## 2020-08-20 DIAGNOSIS — R233 Spontaneous ecchymoses: Secondary | ICD-10-CM | POA: Insufficient documentation

## 2020-08-20 DIAGNOSIS — Z79899 Other long term (current) drug therapy: Secondary | ICD-10-CM | POA: Insufficient documentation

## 2020-08-20 DIAGNOSIS — Z7952 Long term (current) use of systemic steroids: Secondary | ICD-10-CM | POA: Diagnosis not present

## 2020-08-20 DIAGNOSIS — B027 Disseminated zoster: Secondary | ICD-10-CM

## 2020-08-20 DIAGNOSIS — N041 Nephrotic syndrome with focal and segmental glomerular lesions: Secondary | ICD-10-CM | POA: Insufficient documentation

## 2020-08-20 DIAGNOSIS — R42 Dizziness and giddiness: Secondary | ICD-10-CM | POA: Insufficient documentation

## 2020-08-20 DIAGNOSIS — Z7901 Long term (current) use of anticoagulants: Secondary | ICD-10-CM | POA: Insufficient documentation

## 2020-08-20 DIAGNOSIS — Z7984 Long term (current) use of oral hypoglycemic drugs: Secondary | ICD-10-CM | POA: Insufficient documentation

## 2020-08-20 MED ORDER — SULFAMETHOXAZOLE-TRIMETHOPRIM 400-80 MG PO TABS: 1.0000 | ORAL_TABLET | Freq: Every day | ORAL | 1 refills | Status: DC

## 2020-08-20 MED ORDER — VALACYCLOVIR HCL 500 MG PO TABS: 500.0000 mg | ORAL_TABLET | Freq: Two times a day (BID) | ORAL | 1 refills | Status: DC
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
## 2020-08-20 NOTE — Patient Instructions (Addendum)
You are here for folow up of the shingles which is much better- after you complete 1 gram three times a day of valtrex you will start taking 500mg   Twice a day until the prednisone is down to < 20 mg. Also you should continue taking the sulfa/trimethoprim medication. [facility] tacrolimus ( their phone number is [phone]). Also I spoke to your nephrologist regarding BP on the lower side- his recommendation is stop the spirinolactone ( which you already have) and reduce the toresemide to once a day instead of twice aday ( 40mg ) Will follow as needed

## 2020-09-13 ENCOUNTER — Other Ambulatory Visit: Payer: Self-pay

## 2020-09-13 ENCOUNTER — Telehealth: Payer: Self-pay

## 2020-09-13 MED ORDER — VALACYCLOVIR HCL 500 MG PO TABS
500.0000 mg | ORAL_TABLET | Freq: Two times a day (BID) | ORAL | 1 refills | Status: DC
Start: 2020-09-13 — End: 2020-11-29

## 2020-09-13 NOTE — Telephone Encounter (Signed)
Thanks

## 2020-09-13 NOTE — Telephone Encounter (Signed)
Wife called stating patient was to take Valtrex TID but RX was written as BID so they ran out and now [facility] needs new RX. I reviewed last note and saw that the plan was to go to BID 500 mg. Explained to patient and added note to [facility] that okay to refill if insurance permits. Sent refill as well.

## 2020-10-16 ENCOUNTER — Telehealth: Payer: Self-pay

## 2020-10-16 NOTE — Telephone Encounter (Signed)
RN spoke with patient to relay per Dr. Delaine Lame that if he is no longer taking prednisone that he does not need to be taking the Bactrim any longer. Advised patient that Dr. Delaine Lame would still like for him to discuss this with his nephrologist/oncologist. Patient states his nephrologist informed him today that he no longer needs to be on the Bactrim.   Beryle Flock, RN

## 2020-10-16 NOTE — Telephone Encounter (Signed)
Received voicemail from Laith Antonelli, she states patient was discharged from infectious disease, but that Dr. Delaine Lame is still prescribing his Bactrim. She states the patient's oncologist said he still needs to be on the medication, but she is unsure. Will route to provider.  Beryle Flock, RN

## 2020-10-16 NOTE — Telephone Encounter (Signed)
If the patient is not on Prednisone , he need not take bactrim- Let him discuss with his nephrologist [place] regarding that. Thx

## 2020-10-30 IMAGING — CT CT BIOPSY
1 of 3 series · 10 of 32 positions shown, 16 images · non-contrast
Comparison: none

INDICATION: 48-year-old male with acute kidney injury concerning for possible
glomerulonephritis. He presents for random renal biopsy. Due to poor
visualization on ultrasound, we will proceed with CT-guided biopsy.

[Series 2: i-spiral 5.0 b30f · axial · 0.98mm/px · z∈[+1115,+1272]mm · 10 of 55 slices shown, 16 images]
[im 5/55  soft-tissue]
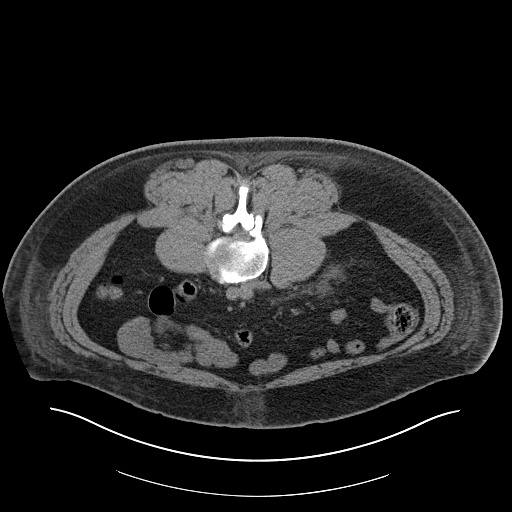
[im 5/55  bone]
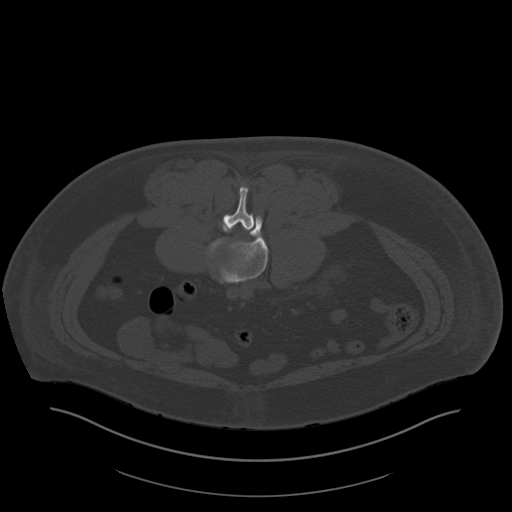
[im 10/55  soft-tissue]
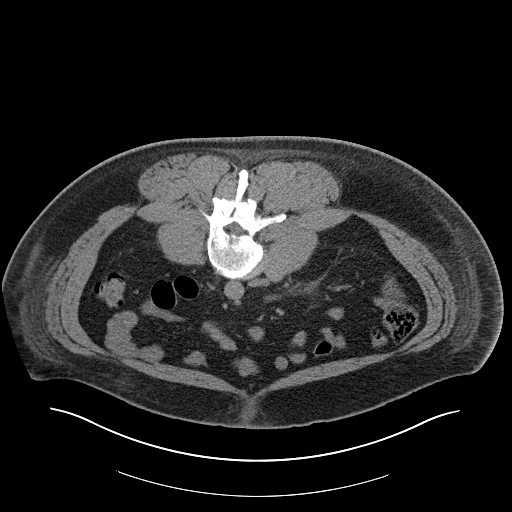
[im 15/55  soft-tissue]
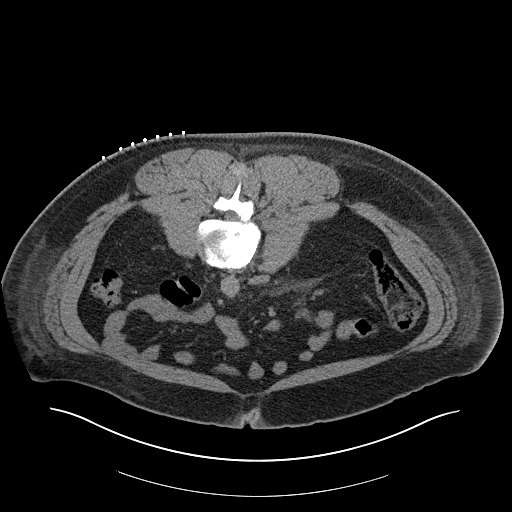
[im 20/55  soft-tissue]
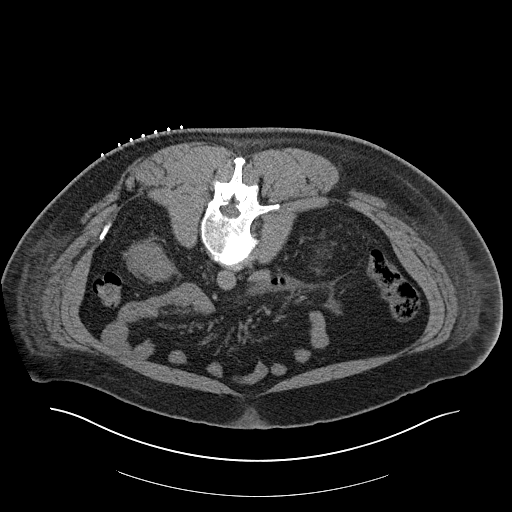
[im 25/55  soft-tissue]
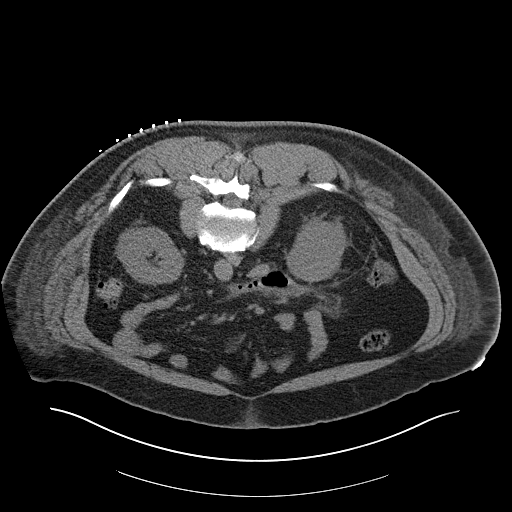
[im 30/55  soft-tissue]
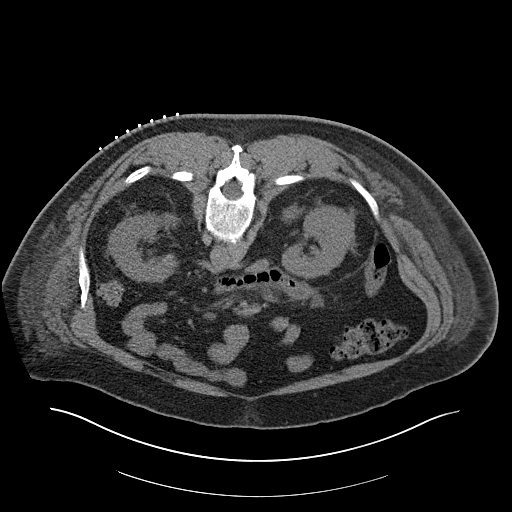
[im 35/55  soft-tissue]
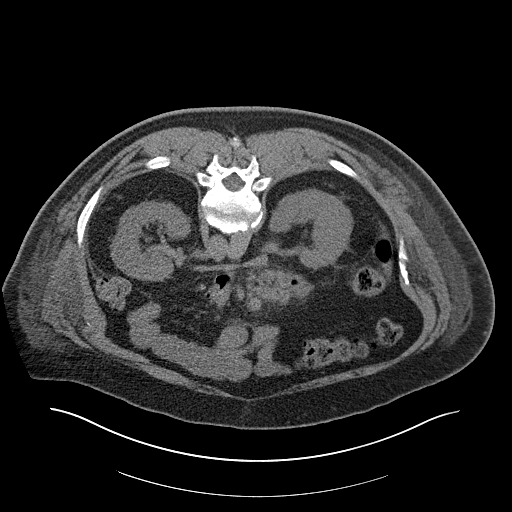
[im 35/55  lung]
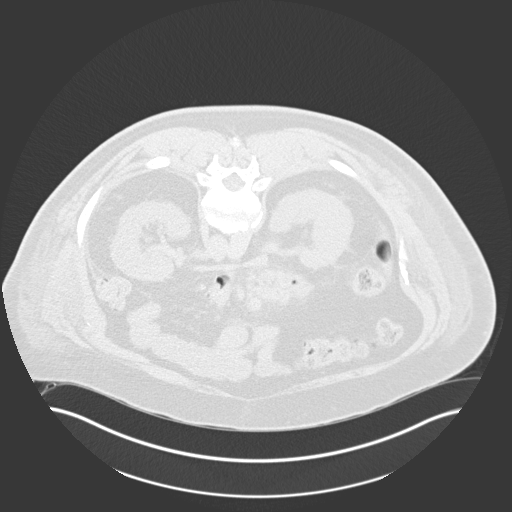
[im 40/55  soft-tissue]
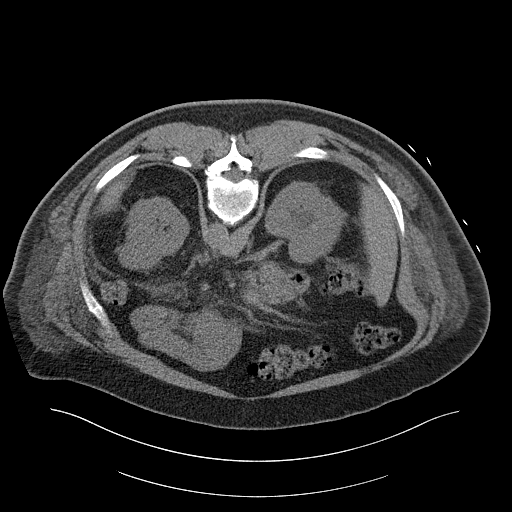
[im 40/55  lung]
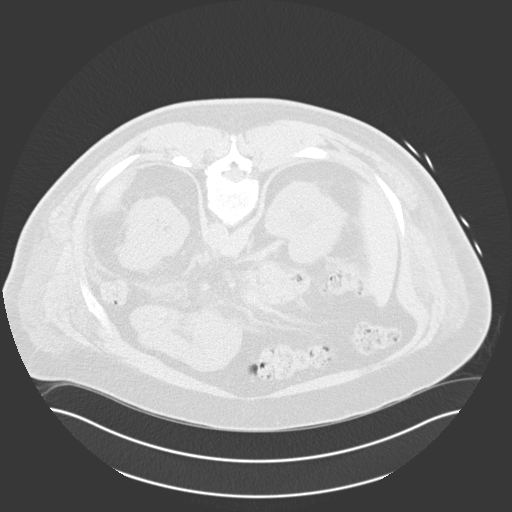
[im 45/55  soft-tissue]
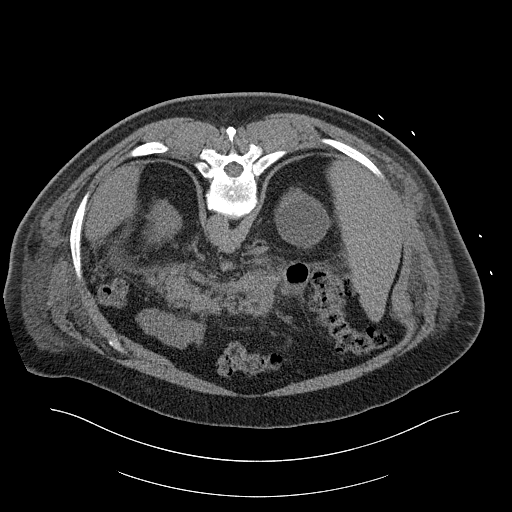
[im 45/55  lung]
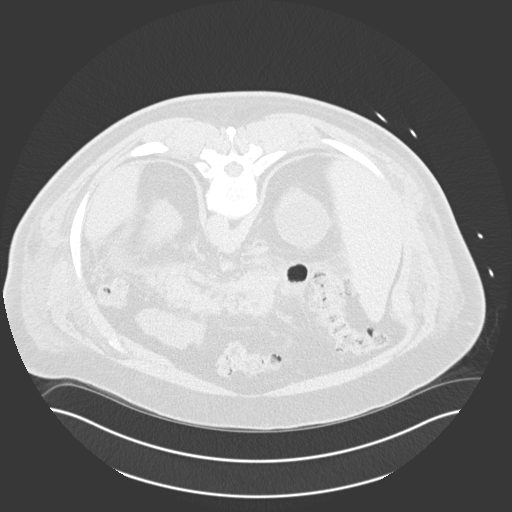
[im 45/55  bone]
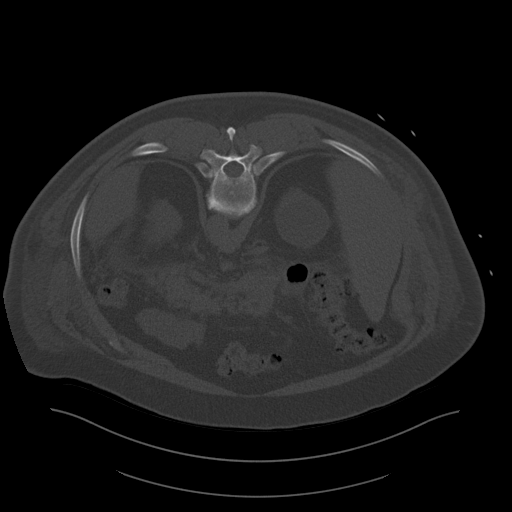
[im 50/55  soft-tissue]
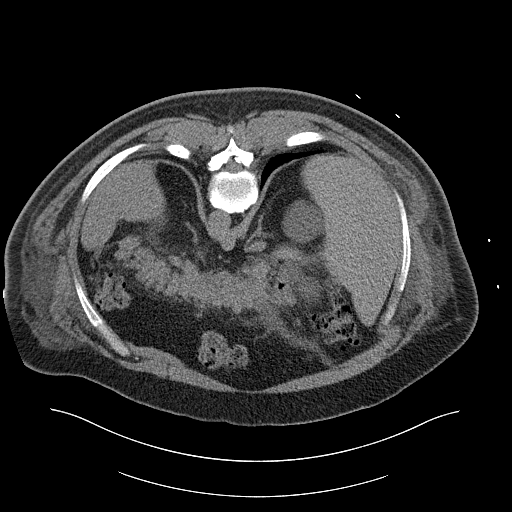
[im 50/55  lung]
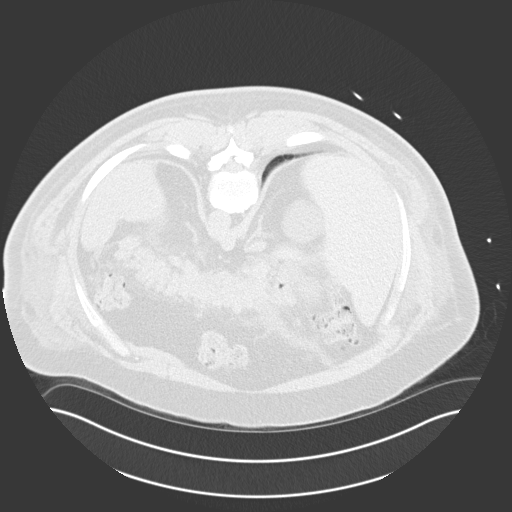

[10 of 32 positions shown; findings below may reference images not displayed]

EXAM:
CT-guided random renal biopsy

MEDICATIONS:
None.

ANESTHESIA/SEDATION:
Moderate (conscious) sedation was employed during this procedure. A
total of Versed 2 mg and Fentanyl 100 mcg was administered
intravenously.

Moderate Sedation Time: 26 minutes. The patient's level of
consciousness and vital signs were monitored continuously by
radiology nursing throughout the procedure under my direct
supervision.

FLUOROSCOPY TIME:  None

COMPLICATIONS:
None immediate.

PROCEDURE:
Informed written consent was obtained from the patient after a
thorough discussion of the procedural risks, benefits and
alternatives. All questions were addressed. A timeout was performed
prior to the initiation of the procedure.

The patient was placed prone on the CT gantry. A suitable skin entry
site was selected and marked. The skin was sterilely prepped and
draped in the standard fashion using chlorhexidine skin prep. Local
anesthesia was attained by infiltration with 1% lidocaine. A small
dermatotomy was made. Under intermittent CT guidance, a 15 gauge
introducer needle was advanced and positioned at the margin of the
lower pole cortex of the right kidney. Several a 16 gauge core
biopsies were then coaxially obtained using the Jim automated
biopsy device.

Biopsy specimens were placed in saline and delivered to pathology
for further analysis. As the introducer needle was removed, the
biopsy tract was embolized with a Gel-Foam slurry. Post biopsy CT
imaging demonstrates no evidence of immediate complication.
IMPRESSION: Technically successful CT-guided random core biopsy of the lower
pole cortex of the left kidney.

## 2020-11-01 IMAGING — US US EXTREM LOW VENOUS
1 series · 13 of 24 positions shown · non-contrast
Comparison: None.

CLINICAL DATA: 48-year-old with anasarca. Pain and swelling in the
lower extremities.



[Series 1: us venous img lower bilat (dvt) · portal-venous · 13 of 74 slices shown]
[im 1/74]
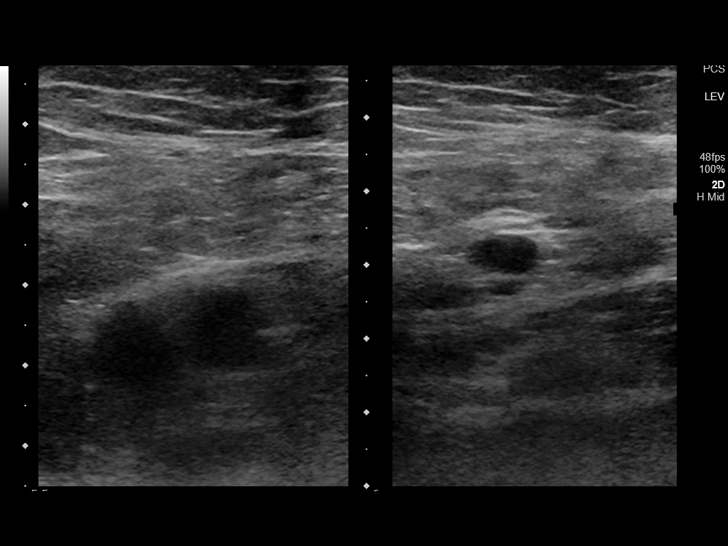
[im 7/74]
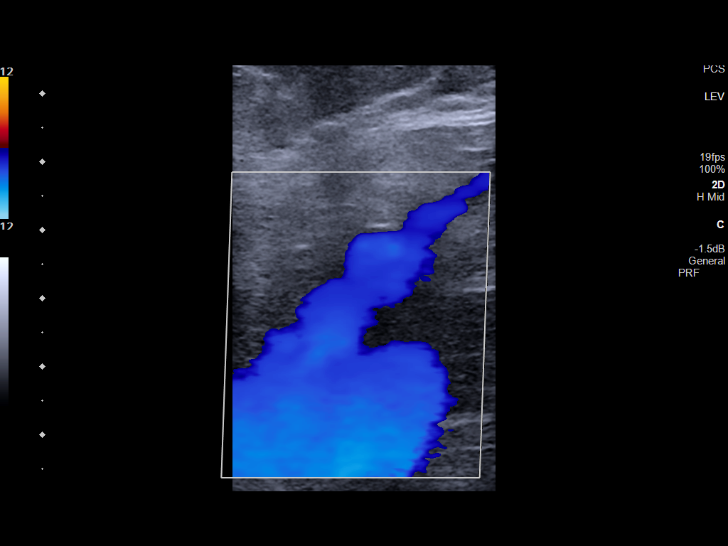
[im 13/74]
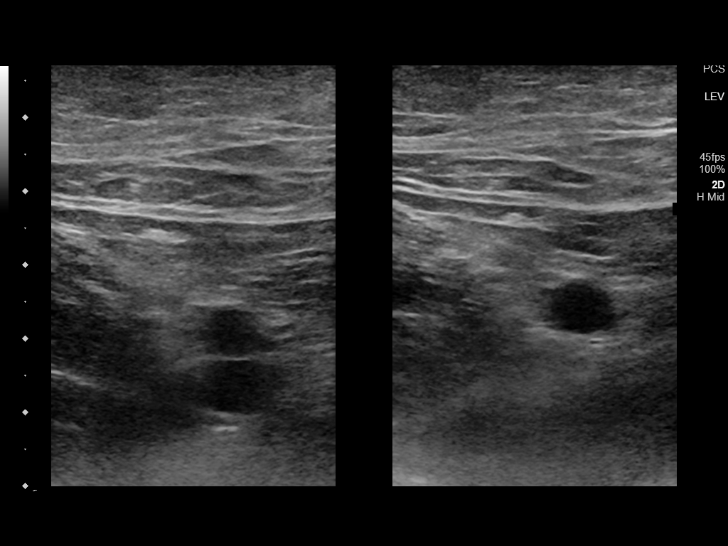
[im 20/74]
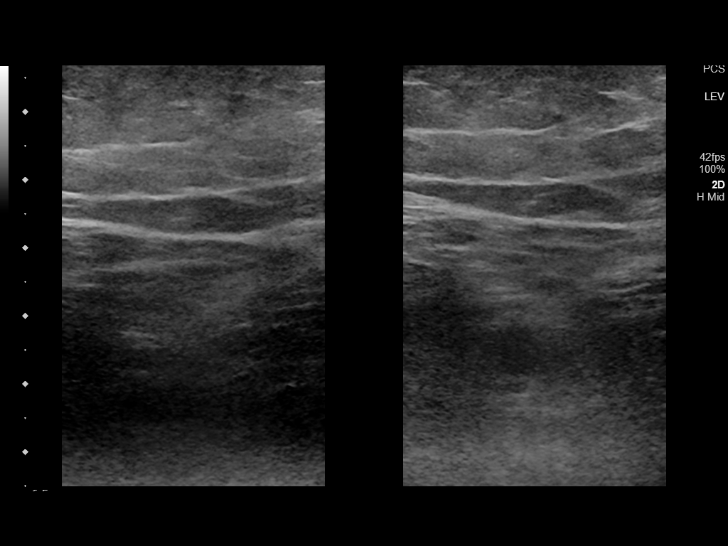
[im 26/74]
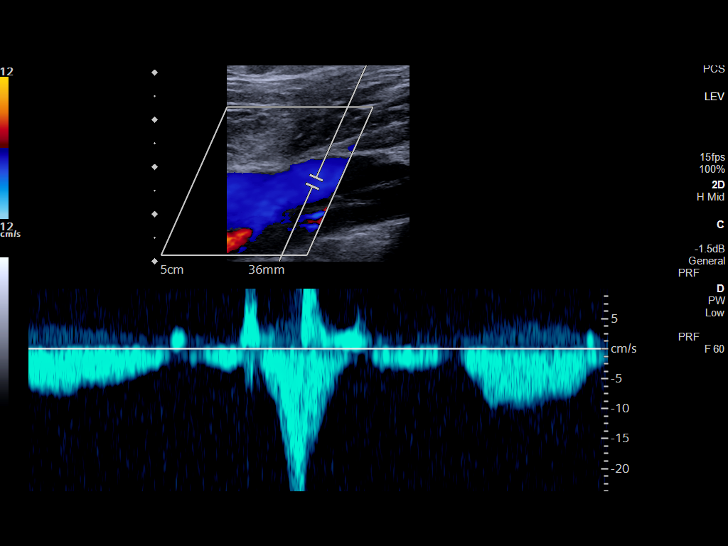
[im 32/74]
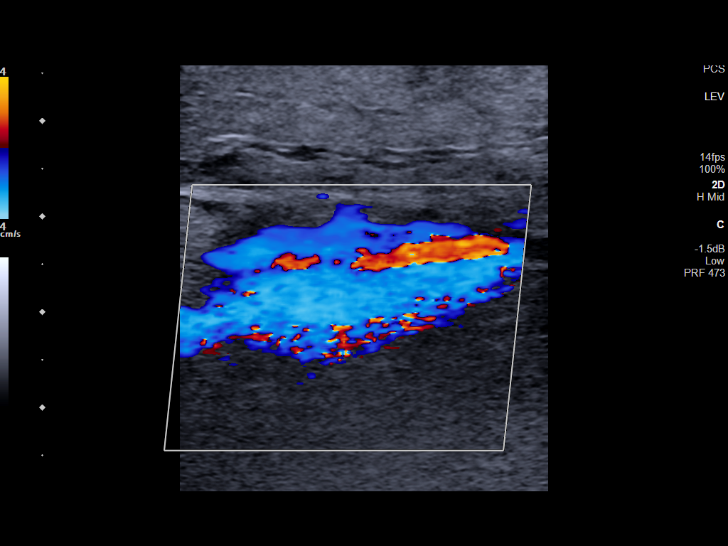
[im 39/74]
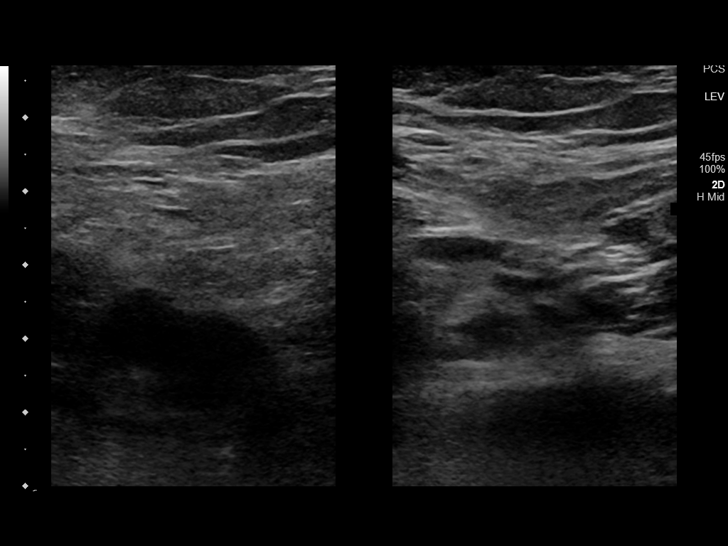
[im 42/74]
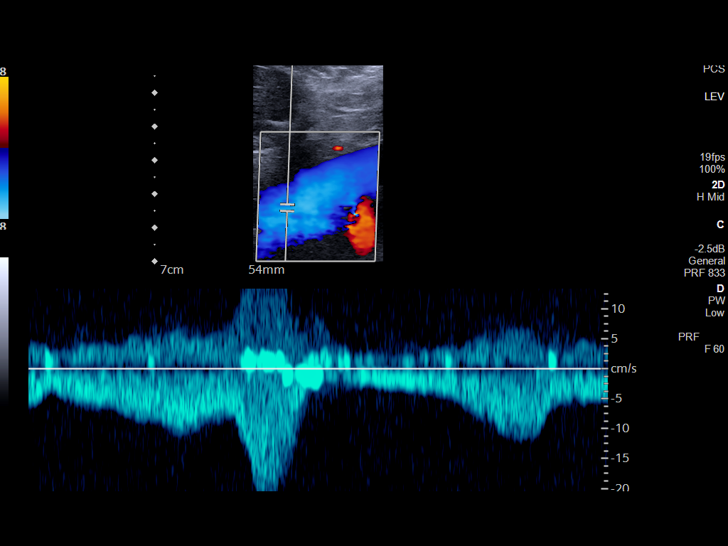
[im 48/74]
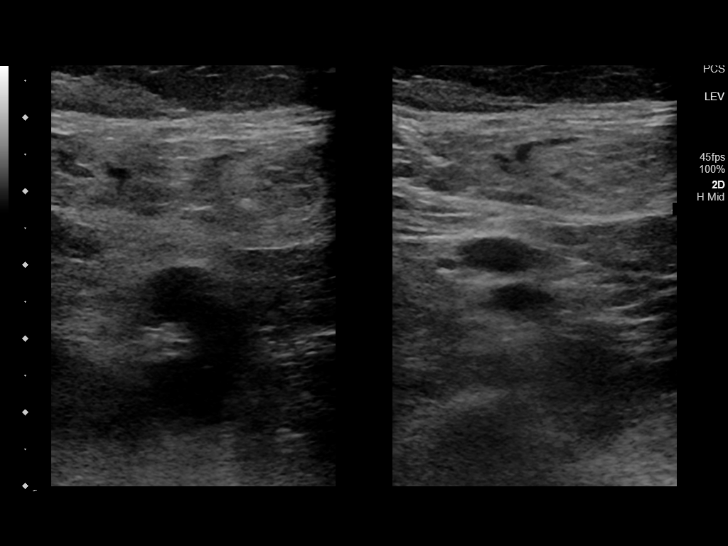
[im 54/74]
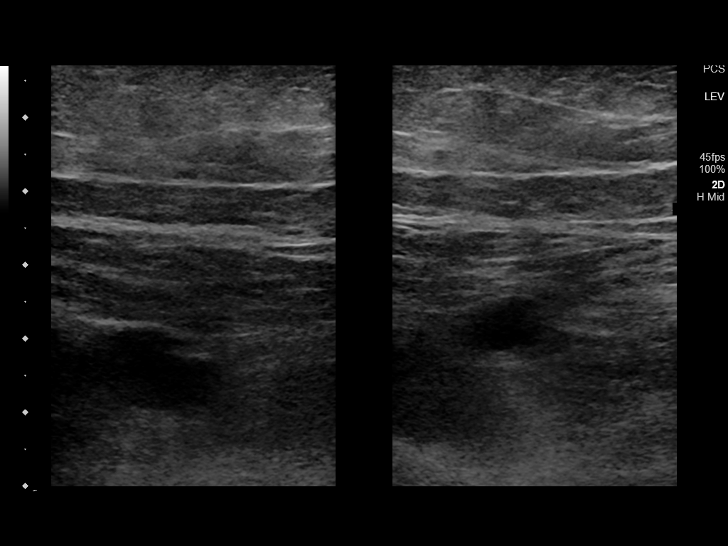
[im 61/74]
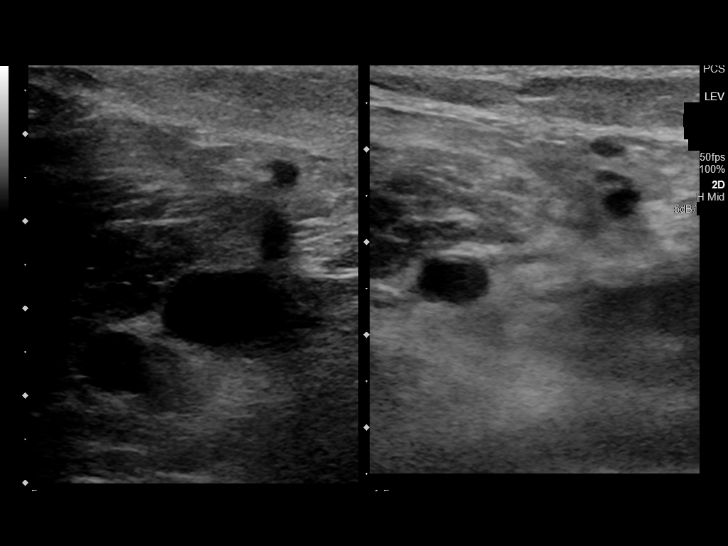
[im 67/74]
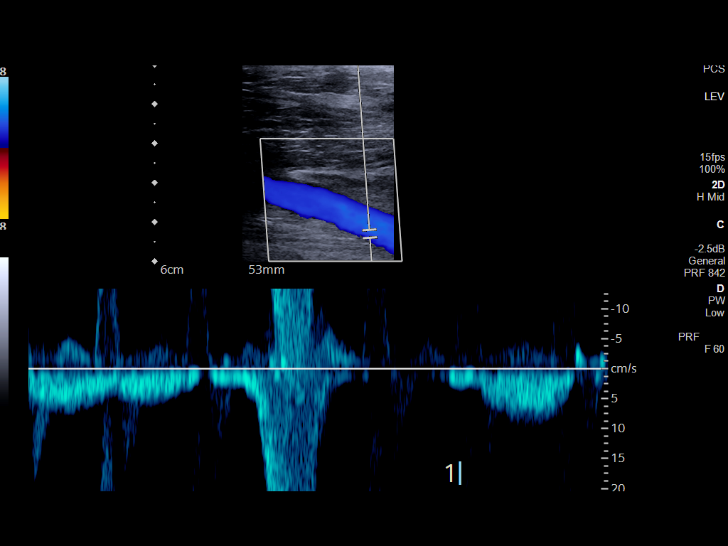
[im 74/74]
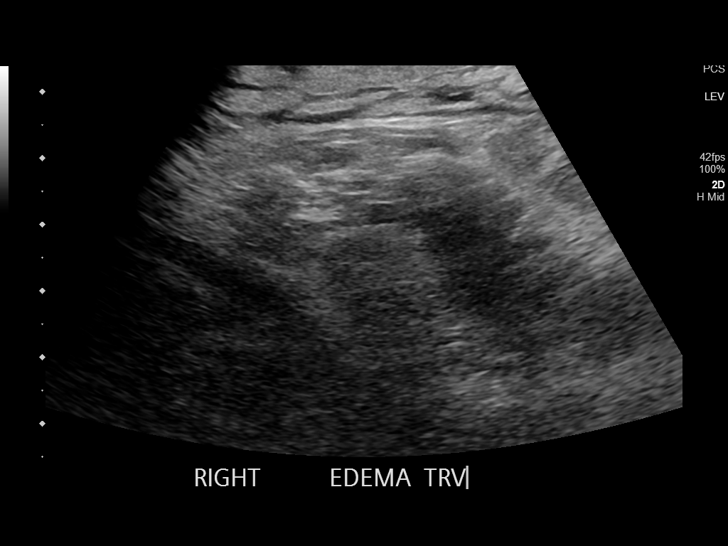

[13 of 24 positions shown; findings below may reference images not displayed]

FINDINGS: RIGHT LOWER EXTREMITY

Common Femoral Vein: No evidence of thrombus. Normal
compressibility, respiratory phasicity and response to augmentation.

Saphenofemoral Junction: No evidence of thrombus. Normal
compressibility and flow on color Doppler imaging.

Profunda Femoral Vein: No evidence of thrombus. Normal
compressibility and flow on color Doppler imaging.

Femoral Vein: No evidence of thrombus. Normal compressibility,
respiratory phasicity and response to augmentation.

Popliteal Vein: No evidence of thrombus. Normal compressibility,
respiratory phasicity and response to augmentation.

Calf Veins: No evidence of thrombus. Normal compressibility and flow
on color Doppler imaging.

Superficial Great Saphenous Vein: No evidence of thrombus. Normal
compressibility.

Venous Reflux:  None.

Other Findings:  None.

LEFT LOWER EXTREMITY

Common Femoral Vein: No evidence of thrombus. Normal
compressibility, respiratory phasicity and response to augmentation.

Saphenofemoral Junction: No evidence of thrombus. Normal
compressibility and flow on color Doppler imaging.

Profunda Femoral Vein: No evidence of thrombus. Normal
compressibility and flow on color Doppler imaging.

Femoral Vein: No evidence of thrombus. Normal compressibility,
respiratory phasicity and response to augmentation.

Popliteal Vein: No evidence of thrombus. Normal compressibility,
respiratory phasicity and response to augmentation.

Calf Veins: No evidence of thrombus. Normal compressibility and flow
on color Doppler imaging.

Superficial Great Saphenous Vein: Not well identified.

Venous Reflux:  None.

Other Findings:  None.
IMPRESSION: No evidence of deep venous thrombosis in either lower extremity.

## 2020-11-12 ENCOUNTER — Encounter: Payer: Self-pay | Admitting: *Deleted

## 2020-11-15 ENCOUNTER — Inpatient Hospital Stay: Payer: BC Managed Care – PPO

## 2020-11-15 ENCOUNTER — Other Ambulatory Visit: Payer: Self-pay

## 2020-11-15 ENCOUNTER — Encounter: Payer: Self-pay | Admitting: Internal Medicine

## 2020-11-15 ENCOUNTER — Inpatient Hospital Stay: Payer: BC Managed Care – PPO | Attending: Internal Medicine | Admitting: Internal Medicine

## 2020-11-15 DIAGNOSIS — Z79899 Other long term (current) drug therapy: Secondary | ICD-10-CM | POA: Insufficient documentation

## 2020-11-15 DIAGNOSIS — M7989 Other specified soft tissue disorders: Secondary | ICD-10-CM | POA: Diagnosis not present

## 2020-11-15 DIAGNOSIS — N009 Acute nephritic syndrome with unspecified morphologic changes: Secondary | ICD-10-CM | POA: Insufficient documentation

## 2020-11-15 DIAGNOSIS — N032 Chronic nephritic syndrome with diffuse membranous glomerulonephritis: Secondary | ICD-10-CM

## 2020-11-15 DIAGNOSIS — E119 Type 2 diabetes mellitus without complications: Secondary | ICD-10-CM | POA: Insufficient documentation

## 2020-11-15 DIAGNOSIS — Z8249 Family history of ischemic heart disease and other diseases of the circulatory system: Secondary | ICD-10-CM | POA: Insufficient documentation

## 2020-11-15 DIAGNOSIS — Z7984 Long term (current) use of oral hypoglycemic drugs: Secondary | ICD-10-CM | POA: Insufficient documentation

## 2020-11-15 DIAGNOSIS — Z5112 Encounter for antineoplastic immunotherapy: Secondary | ICD-10-CM | POA: Diagnosis present

## 2020-11-15 DIAGNOSIS — N183 Chronic kidney disease, stage 3 unspecified: Secondary | ICD-10-CM | POA: Insufficient documentation

## 2020-11-15 DIAGNOSIS — Z298 Encounter for other specified prophylactic measures: Secondary | ICD-10-CM | POA: Insufficient documentation

## 2020-11-15 DIAGNOSIS — Z7982 Long term (current) use of aspirin: Secondary | ICD-10-CM | POA: Diagnosis not present

## 2020-11-15 DIAGNOSIS — Z92241 Personal history of systemic steroid therapy: Secondary | ICD-10-CM | POA: Insufficient documentation
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
## 2020-11-15 NOTE — Assessment & Plan Note (Addendum)
#  Focal segmental glomera sclerosis-failed steroids.  As per nephrology recommend rituximab 370 mg/m infusion weekly x4. Discussed with Dr.Kolluru  # Diabetes/ Blood glucose [[facility], in [place]]-currently on Metformin.  Recommend monitoring blood glucose closely post chemotherapy.  Discussed that his blood sugars-would rise posttreatment because of premedication/dexamethasone.  # Discussed mechanism of action of rituximab;and also potential side effects including but not limited to infusion reactions;rare infections/activation including hepatitis.  Hepatitis work-up at baseline- feb 2022- neg.   # COVID EVUSHELD PROPHYLAXIS: Given the immunosuppressive effects of treatments I would recommend EVUSHELD prophylaxis.  IM injection x2 [same time]-cutdowns risk of Covid infection/next 6 months. Patient is unvaccinated to [place].  Discussed with Ms.Causey, [place]-who will speak to the patient.  # DISPOSITION: # Rituxan weekly x4. Start next week.  # follow up as needed- Dr.B  Addendum: We will follow on 3/25-with #2 Rituxan infusion.

## 2020-11-18 ENCOUNTER — Other Ambulatory Visit: Payer: Self-pay | Admitting: *Deleted

## 2020-11-18 ENCOUNTER — Other Ambulatory Visit: Payer: Self-pay | Admitting: Adult Health

## 2020-11-18 DIAGNOSIS — N032 Chronic nephritic syndrome with diffuse membranous glomerulonephritis: Secondary | ICD-10-CM

## 2020-11-18 NOTE — Progress Notes (Signed)
ALERT: A disease instance has been permanently removed from this patient's pathway record and replaced with a new disease instance. Information on the new disease instance will be transmitted in a separate message.    Disease Being Removed: Other    Reason for Removal: Reason not listed

## 2020-11-18 NOTE — Progress Notes (Signed)
I connected by phone with Russell Grant on 11/18/2020, 11:36 AM to discuss the potential use of a new treatment, tixagevimab/cilgavimab, for pre-exposure prophylaxis for prevention of coronavirus disease 2019 (COVID-19) caused by the SARS-CoV-2 virus.  This patient is a 51 y.o. male that meets the FDA criteria for Emergency Use Authorization of tixagevimab/cilgavimab for pre-exposure prophylaxis of COVID-19 disease. Pt meets following criteria:  Age >12 yr and weight > 40kg  Not currently infected with SARS-CoV-2 and has no known recent exposure to an individual infected with SARS-CoV-2 AND o Who has moderate to severe immune compromise due to a medical condition or receipt of immunosuppressive medications or treatments and may not mount an adequate immune response to COVID-19 vaccination or  o Vaccination with any available COVID-19 vaccine, according to the approved or authorized schedule, is not recommended due to a history of severe adverse reaction (e.g., severe allergic reaction) to a COVID-19 vaccine(s) and/or COVID-19 vaccine component(s).  o Patient meets the following definition of mod-severe immune compromised status: 2. Received B-cell depleting therapies within the last 6 months and age < 66yrs  I have spoken and communicated the following to the patient or parent/caregiver regarding COVID monoclonal antibody treatment:  1. FDA has authorized the emergency use of tixagevimab/cilgavimab for the pre-exposure prophylaxis of COVID-19 in patients with moderate-severe immunocompromised status, who meet above EUA criteria.  2. The significant known and potential risks and benefits of COVID monoclonal antibody, and the extent to which such potential risks and benefits are unknown.  3. Information on available alternative treatments and the risks and benefits of those alternatives, including clinical trials.  4. The patient or parent/caregiver has the option to accept or refuse COVID  monoclonal antibody treatment.  After reviewing this information with the patient, agree to receive tixagevimab/cilgavimab.  High priority schedule message sent to [facility] scheduling for patient to receive this week.    Scot Dock, NP, 11/18/2020, 11:36 AM

## 2020-11-18 NOTE — Progress Notes (Signed)
START OFF PATHWAY REGIMEN - Other   [id number]:Rituximab (Weekly):   Administer weekly:     Rituximab-xxxx   **Always confirm dose/schedule in your pharmacy ordering system**  Patient Characteristics: Intent of Therapy: Curative Intent, Discussed with Patient 

## 2020-11-19 ENCOUNTER — Other Ambulatory Visit: Payer: Self-pay

## 2020-11-19 ENCOUNTER — Inpatient Hospital Stay: Payer: BC Managed Care – PPO

## 2020-11-19 DIAGNOSIS — N032 Chronic nephritic syndrome with diffuse membranous glomerulonephritis: Secondary | ICD-10-CM

## 2020-11-19 DIAGNOSIS — N009 Acute nephritic syndrome with unspecified morphologic changes: Secondary | ICD-10-CM | POA: Diagnosis not present

## 2020-11-19 MED ORDER — TIXAGEVIMAB (PART OF EVUSHELD) INJECTION
300.0000 mg | Freq: Once | INTRAMUSCULAR | Status: AC
  Administered 2020-11-19: 300 mg via INTRAMUSCULAR
  Filled 2020-11-19: qty 3

## 2020-11-19 MED ORDER — CILGAVIMAB (PART OF EVUSHELD) INJECTION
300.0000 mg | Freq: Once | INTRAMUSCULAR | Status: AC
  Administered 2020-11-19: 300 mg via INTRAMUSCULAR
  Filled 2020-11-19: qty 3

## 2020-11-19 NOTE — Progress Notes (Signed)
Pt monitored for 1 hour post injections. Completed without incident. Pt discharged stable.

## 2020-11-21 IMAGING — US US ABDOMEN COMPLETE
1 series · 13 of 25 positions shown · non-contrast
Comparison: Renal sonogram from May 07, 2020

CLINICAL DATA: Diffuse abdominal pain

EXAM:
ABDOMEN ULTRASOUND COMPLETE

[Series 1: us abdomen complete · 13 of 203 slices shown]
[im 1/203]
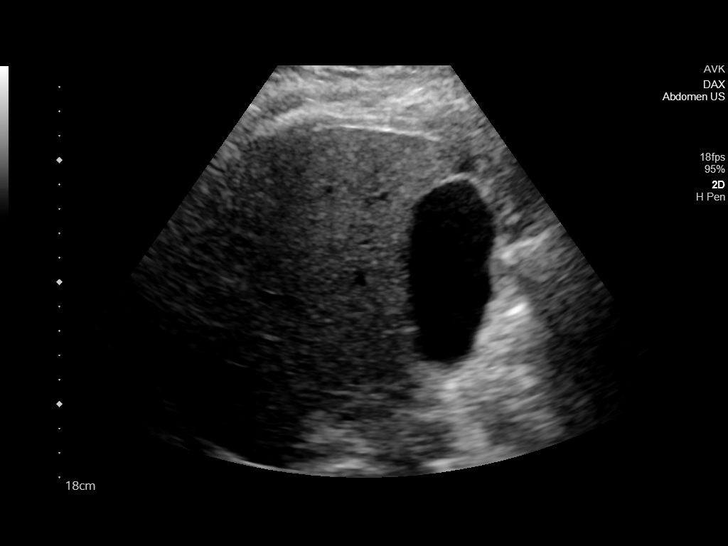
[im 17/203]
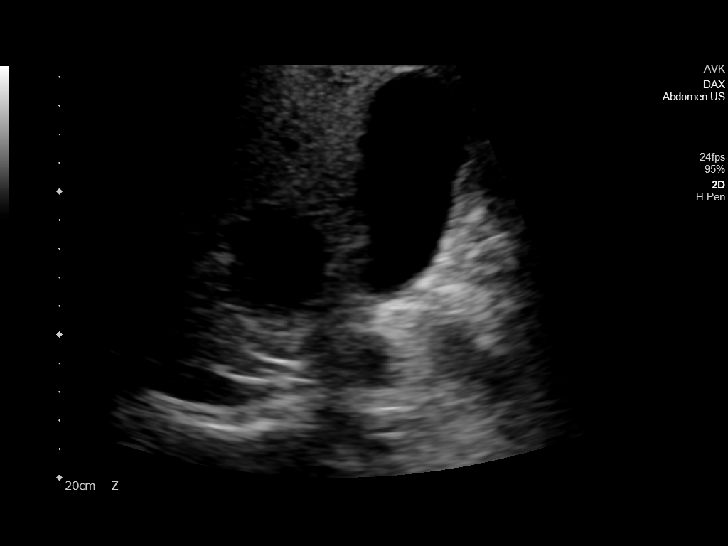
[im 34/203]
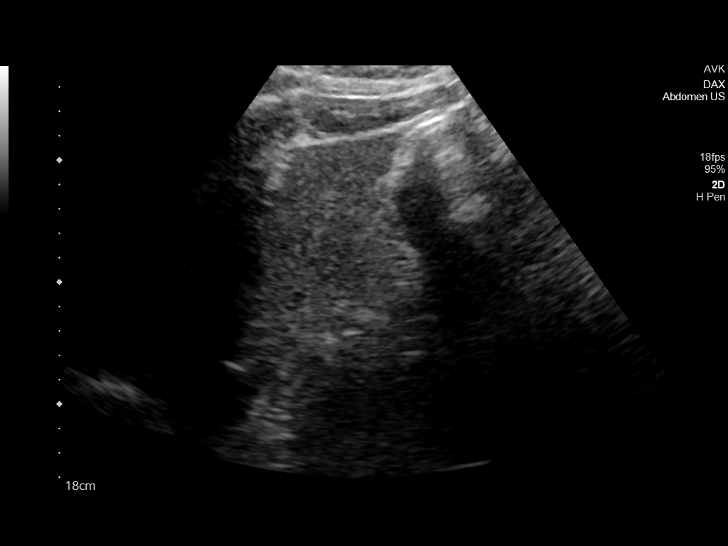
[im 51/203]
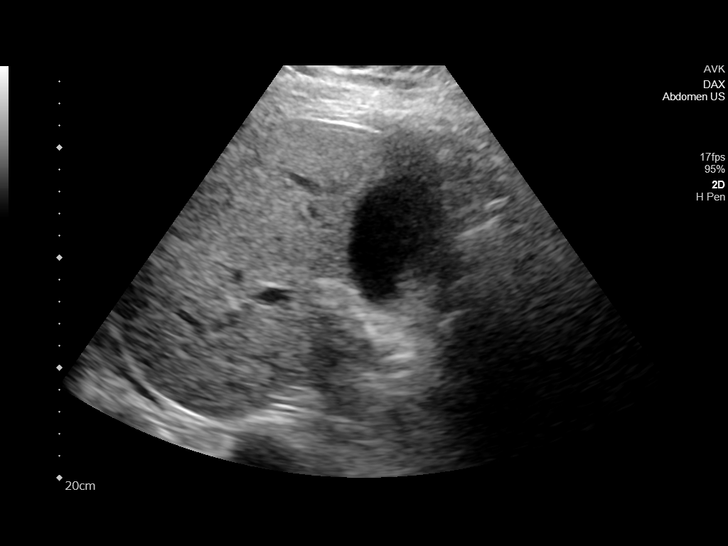
[im 68/203]
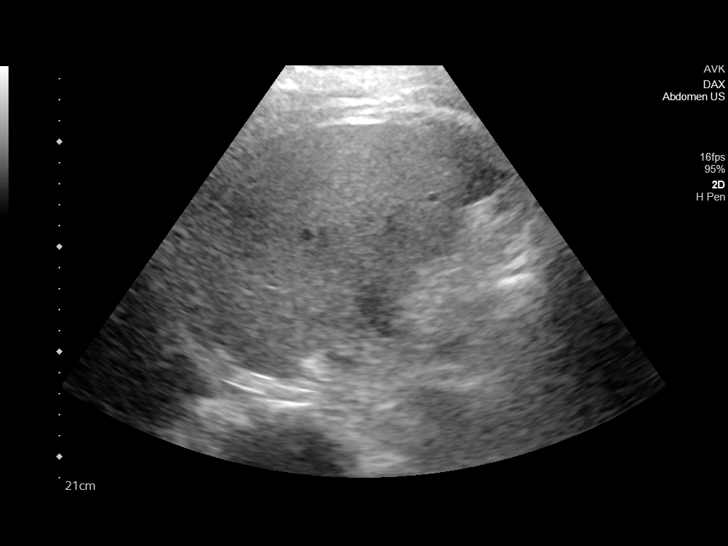
[im 85/203]
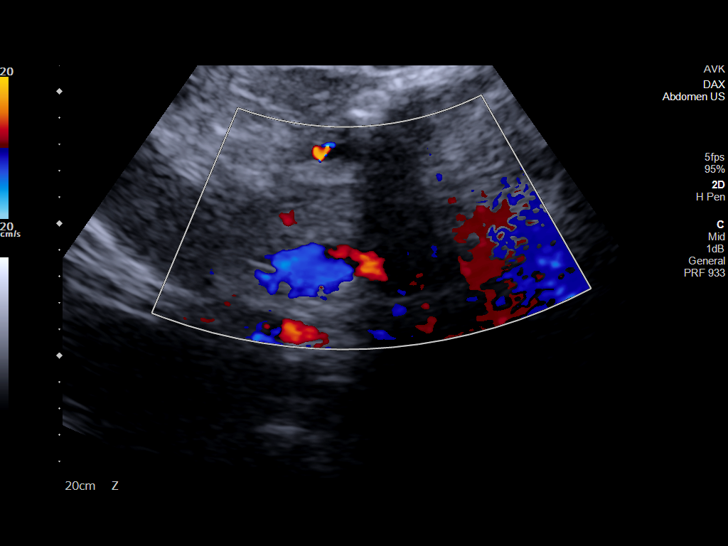
[im 102/203]
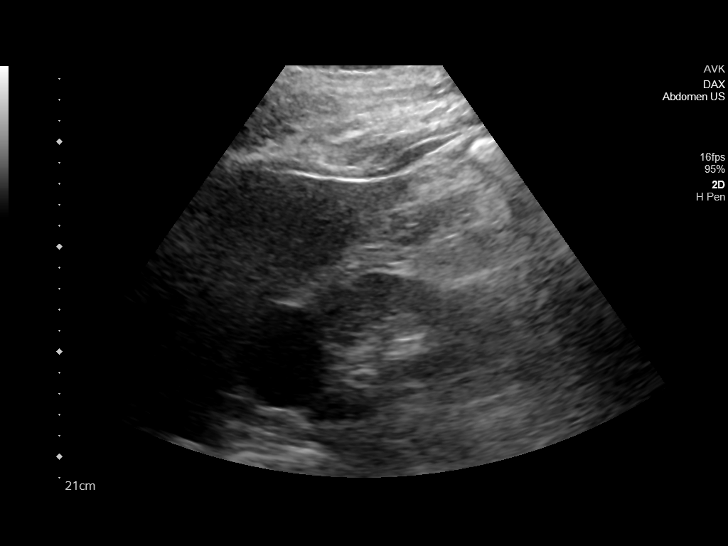
[im 118/203]
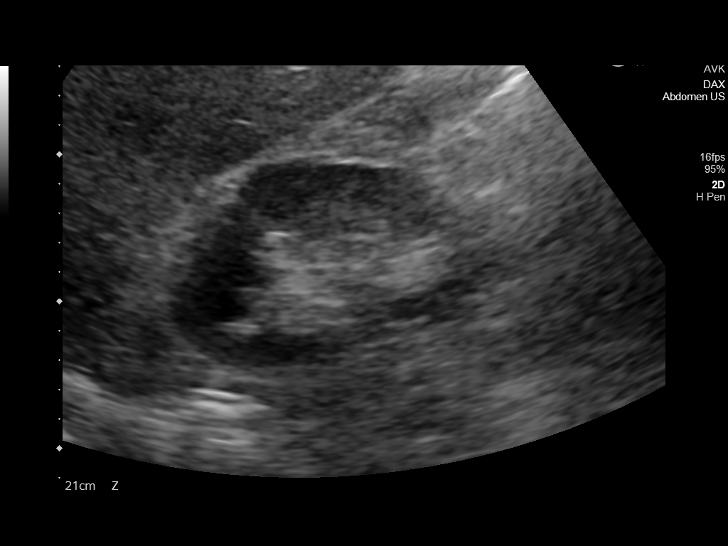
[im 135/203]
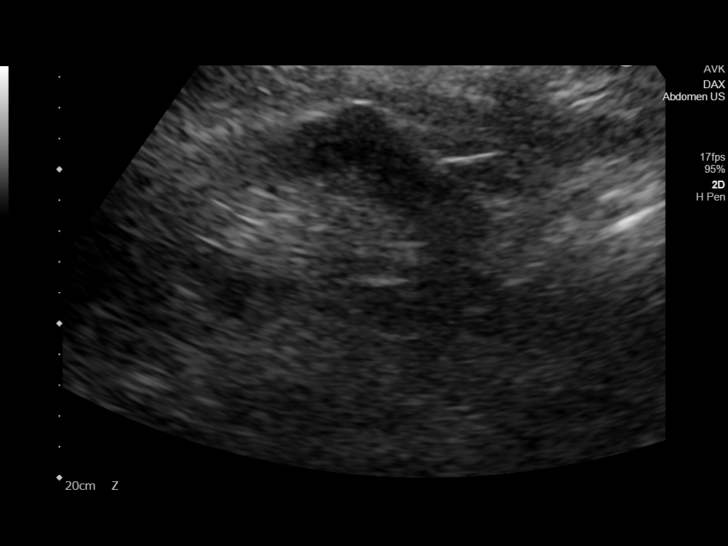
[im 152/203]
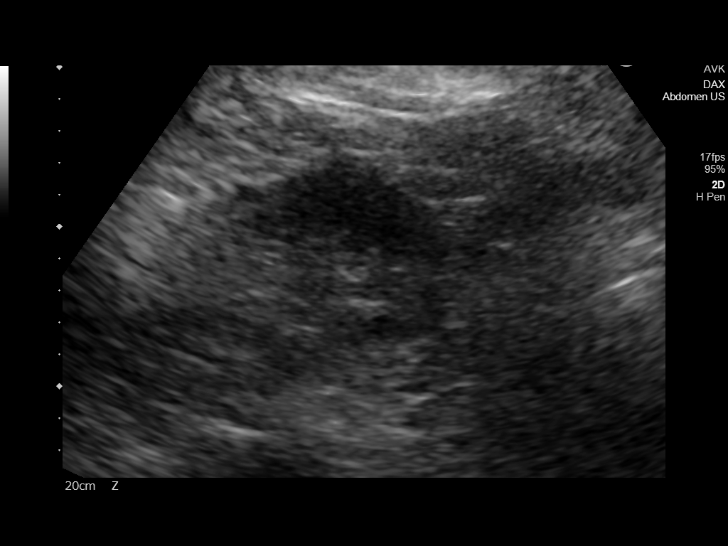
[im 169/203]
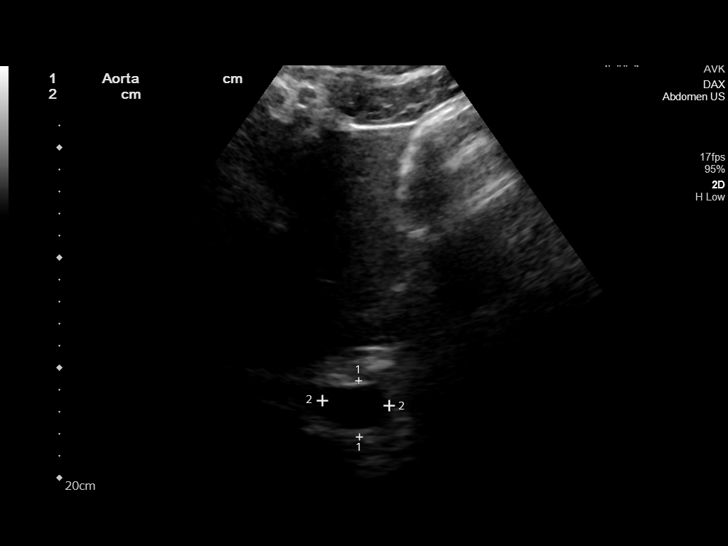
[im 186/203]
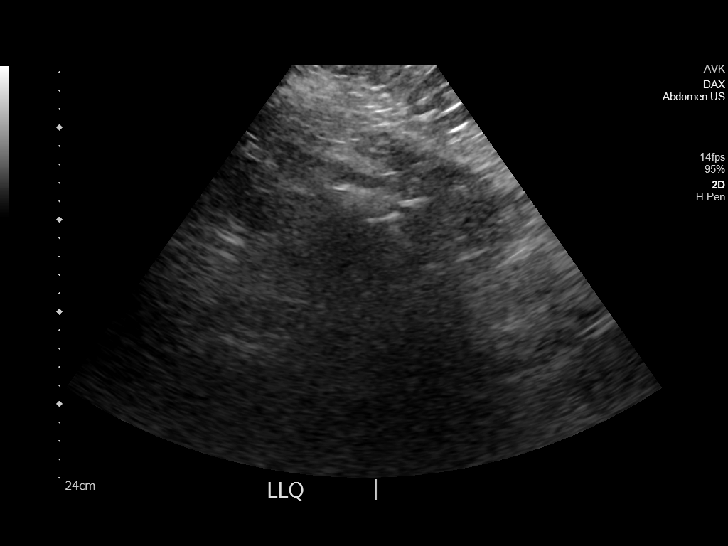
[im 203/203]
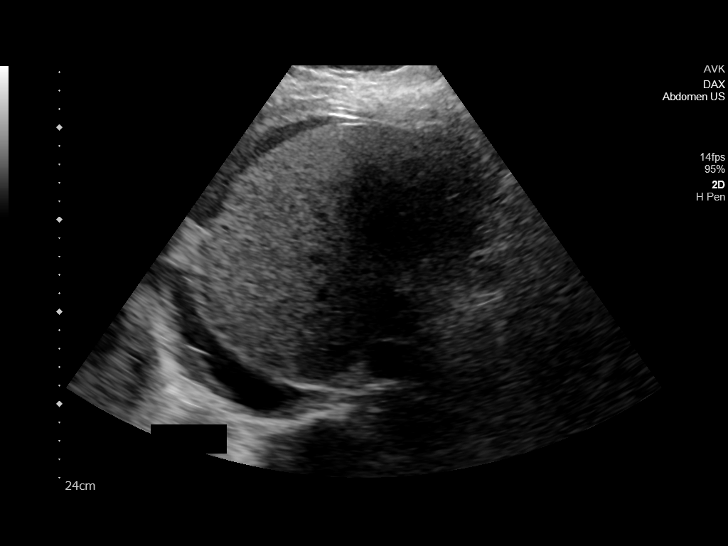

[13 of 25 positions shown; findings below may reference images not displayed]

FINDINGS: Gallbladder: No gallstones or wall thickening visualized. No
sonographic Murphy sign noted by sonographer.

Common bile duct: Diameter: 4 mm

Liver: Echogenicity mildly increased in hepatic parenchyma.
Assessment of hepatic parenchyma limited by increased echogenicity
and patient body habitus. Portal vein is patent on color Doppler
imaging with normal direction of blood flow towards the liver.

IVC: No abnormality visualized.

Pancreas: Not well visualized. No gross abnormality in the
visualized portion.

Spleen: Size and appearance within normal limits.

Right Kidney: Length: 11.7 cm. No hydronephrosis with a 4.4 x 4.1 x
3.9 cm cyst in the upper pole.

Left Kidney: Length: 11.2 cm. No hydronephrosis, visible lesion or
nephrolithiasis on limited assessment.

Abdominal aorta: Distal portion of the aorta obscured by overlying
bowel gas. Maximum AP diameter 2.5 cm.

Other findings: RIGHT pleural effusion and small to moderate
ascites.
IMPRESSION: 1. Mild increased hepatic echogenicity suggest background hepatic
steatosis.
2. No hydronephrosis.
3. RIGHT pleural effusion and small to moderate volume of ascites in
keeping with the patient's history of anasarca.
4. 2.5 cm mild dilation of the upper abdominal aorta. Distal aorta
not well visualized.

## 2020-11-22 ENCOUNTER — Inpatient Hospital Stay: Payer: BC Managed Care – PPO

## 2020-11-22 ENCOUNTER — Other Ambulatory Visit: Payer: Self-pay

## 2020-11-22 VITALS — BP 136/59 | HR 85 | Temp 97.1°F | Resp 20 | Wt 284.0 lb

## 2020-11-22 DIAGNOSIS — N032 Chronic nephritic syndrome with diffuse membranous glomerulonephritis: Secondary | ICD-10-CM

## 2020-11-22 DIAGNOSIS — N009 Acute nephritic syndrome with unspecified morphologic changes: Secondary | ICD-10-CM | POA: Diagnosis not present

## 2020-11-22 LAB — COMPREHENSIVE METABOLIC PANEL
ALT: 20 U/L (ref 0–44)
AST: 27 U/L (ref 15–41)
Albumin: 2.7 g/dL — ABNORMAL LOW (ref 3.5–5.0)
Alkaline Phosphatase: 74 U/L (ref 38–126)
Anion gap: 11 (ref 5–15)
BUN: 71 mg/dL — ABNORMAL HIGH (ref 6–20)
CO2: 32 mmol/L (ref 22–32)
Calcium: 8.9 mg/dL (ref 8.9–10.3)
Chloride: 96 mmol/L — ABNORMAL LOW (ref 98–111)
Creatinine, Ser: 2.13 mg/dL — ABNORMAL HIGH (ref 0.61–1.24)
GFR, Estimated: 37 mL/min — ABNORMAL LOW (ref >60)
Glucose, Bld: 225 mg/dL — ABNORMAL HIGH (ref 70–99)
Potassium: 3.4 mmol/L — ABNORMAL LOW (ref 3.5–5.1)
Sodium: 139 mmol/L (ref 135–145)
Total Bilirubin: 0.9 mg/dL (ref 0.3–1.2)
Total Protein: 5.8 g/dL — ABNORMAL LOW (ref 6.5–8.1)

## 2020-11-22 LAB — CBC WITH DIFFERENTIAL/PLATELET
Abs Immature Granulocytes: 0.03 10*3/uL (ref 0.00–0.07)
Basophils Absolute: 0 10*3/uL (ref 0.0–0.1)
Basophils Relative: 1 %
Eosinophils Absolute: 0.4 10*3/uL (ref 0.0–0.5)
Eosinophils Relative: 5 %
HCT: 29.8 % — ABNORMAL LOW (ref 39.0–52.0)
Hemoglobin: 9.9 g/dL — ABNORMAL LOW (ref 13.0–17.0)
Immature Granulocytes: 0 %
Lymphocytes Relative: 18 %
Lymphs Abs: 1.4 10*3/uL (ref 0.7–4.0)
MCH: 33.3 pg (ref 26.0–34.0)
MCHC: 33.2 g/dL (ref 30.0–36.0)
MCV: 100.3 fL — ABNORMAL HIGH (ref 80.0–100.0)
Monocytes Absolute: 0.5 10*3/uL (ref 0.1–1.0)
Monocytes Relative: 7 %
Neutro Abs: 5.3 10*3/uL (ref 1.7–7.7)
Neutrophils Relative %: 69 %
Platelets: 204 10*3/uL (ref 150–400)
RBC: 2.97 MIL/uL — ABNORMAL LOW (ref 4.22–5.81)
RDW: 13.1 % (ref 11.5–15.5)
WBC: 7.7 10*3/uL (ref 4.0–10.5)
nRBC: 0 % (ref 0.0–0.2)

## 2020-11-22 MED ORDER — DEXAMETHASONE SODIUM PHOSPHATE 10 MG/ML IJ SOLN
8.0000 mg | Freq: Once | INTRAMUSCULAR | Status: AC
  Administered 2020-11-22: 8 mg via INTRAVENOUS
  Filled 2020-11-22: qty 1

## 2020-11-22 MED ORDER — DIPHENHYDRAMINE HCL 25 MG PO CAPS
50.0000 mg | ORAL_CAPSULE | Freq: Once | ORAL | Status: AC
Start: 2020-11-22 — End: 2020-11-22
  Administered 2020-11-22: 50 mg via ORAL
  Filled 2020-11-22: qty 2

## 2020-11-22 MED ORDER — ACETAMINOPHEN 325 MG PO TABS
650.0000 mg | ORAL_TABLET | Freq: Once | ORAL | Status: AC
  Administered 2020-11-22: 650 mg via ORAL
  Filled 2020-11-22: qty 2

## 2020-11-22 MED ORDER — SODIUM CHLORIDE 0.9 % IV SOLN
Freq: Once | INTRAVENOUS | Status: AC
  Filled 2020-11-22: qty 250

## 2020-11-22 MED ORDER — RITUXIMAB-PVVR CHEMO 500 MG/50ML IV SOLN
375.0000 mg/m2 | Freq: Once | INTRAVENOUS | Status: AC
  Administered 2020-11-22: 900 mg via INTRAVENOUS
  Filled 2020-11-22: qty 50

## 2020-11-22 NOTE — Progress Notes (Signed)
Tolerated first Ruxience today well. Stable at discharge.

## 2020-11-29 ENCOUNTER — Inpatient Hospital Stay: Payer: BC Managed Care – PPO

## 2020-11-29 ENCOUNTER — Inpatient Hospital Stay: Payer: BC Managed Care – PPO | Admitting: Internal Medicine

## 2020-11-29 ENCOUNTER — Encounter: Payer: Self-pay | Admitting: Internal Medicine

## 2020-11-29 ENCOUNTER — Other Ambulatory Visit: Payer: Self-pay

## 2020-11-29 VITALS — BP 127/60 | HR 90 | Temp 98.0°F | Resp 20

## 2020-11-29 DIAGNOSIS — N032 Chronic nephritic syndrome with diffuse membranous glomerulonephritis: Secondary | ICD-10-CM

## 2020-11-29 DIAGNOSIS — N009 Acute nephritic syndrome with unspecified morphologic changes: Secondary | ICD-10-CM | POA: Diagnosis not present

## 2020-11-29 LAB — CBC WITH DIFFERENTIAL/PLATELET
Abs Immature Granulocytes: 0.08 10*3/uL — ABNORMAL HIGH (ref 0.00–0.07)
Basophils Absolute: 0.1 10*3/uL (ref 0.0–0.1)
Basophils Relative: 0 %
Eosinophils Absolute: 0.3 10*3/uL (ref 0.0–0.5)
Eosinophils Relative: 2 %
HCT: 32.6 % — ABNORMAL LOW (ref 39.0–52.0)
Hemoglobin: 10.5 g/dL — ABNORMAL LOW (ref 13.0–17.0)
Immature Granulocytes: 1 %
Lymphocytes Relative: 9 %
Lymphs Abs: 1.4 10*3/uL (ref 0.7–4.0)
MCH: 31.6 pg (ref 26.0–34.0)
MCHC: 32.2 g/dL (ref 30.0–36.0)
MCV: 98.2 fL (ref 80.0–100.0)
Monocytes Absolute: 1 10*3/uL (ref 0.1–1.0)
Monocytes Relative: 7 %
Neutro Abs: 12.8 10*3/uL — ABNORMAL HIGH (ref 1.7–7.7)
Neutrophils Relative %: 81 %
Platelets: 252 10*3/uL (ref 150–400)
RBC: 3.32 MIL/uL — ABNORMAL LOW (ref 4.22–5.81)
RDW: 13.1 % (ref 11.5–15.5)
WBC: 15.7 10*3/uL — ABNORMAL HIGH (ref 4.0–10.5)
nRBC: 0 % (ref 0.0–0.2)

## 2020-11-29 LAB — COMPREHENSIVE METABOLIC PANEL
ALT: 20 U/L (ref 0–44)
AST: 21 U/L (ref 15–41)
Albumin: 2.9 g/dL — ABNORMAL LOW (ref 3.5–5.0)
Alkaline Phosphatase: 61 U/L (ref 38–126)
Anion gap: 15 (ref 5–15)
BUN: 76 mg/dL — ABNORMAL HIGH (ref 6–20)
CO2: 31 mmol/L (ref 22–32)
Calcium: 9.4 mg/dL (ref 8.9–10.3)
Chloride: 91 mmol/L — ABNORMAL LOW (ref 98–111)
Creatinine, Ser: 2.07 mg/dL — ABNORMAL HIGH (ref 0.61–1.24)
GFR, Estimated: 38 mL/min — ABNORMAL LOW (ref >60)
Glucose, Bld: 202 mg/dL — ABNORMAL HIGH (ref 70–99)
Potassium: 3.6 mmol/L (ref 3.5–5.1)
Sodium: 137 mmol/L (ref 135–145)
Total Bilirubin: 0.6 mg/dL (ref 0.3–1.2)
Total Protein: 6.2 g/dL — ABNORMAL LOW (ref 6.5–8.1)

## 2020-11-29 MED ORDER — SODIUM CHLORIDE 0.9 % IV SOLN
375.0000 mg/m2 | Freq: Once | INTRAVENOUS | Status: AC
  Administered 2020-11-29: 900 mg via INTRAVENOUS
  Filled 2020-11-29: qty 50

## 2020-11-29 MED ORDER — DEXAMETHASONE SODIUM PHOSPHATE 10 MG/ML IJ SOLN
4.0000 mg | Freq: Once | INTRAMUSCULAR | Status: AC
  Administered 2020-11-29: 4 mg via INTRAVENOUS
  Filled 2020-11-29: qty 1

## 2020-11-29 MED ORDER — SODIUM CHLORIDE 0.9 % IV SOLN
Freq: Once | INTRAVENOUS | Status: AC
  Filled 2020-11-29: qty 250

## 2020-11-29 MED ORDER — DIPHENHYDRAMINE HCL 25 MG PO CAPS
50.0000 mg | ORAL_CAPSULE | Freq: Once | ORAL | Status: AC
  Administered 2020-11-29: 50 mg via ORAL
  Filled 2020-11-29: qty 2

## 2020-11-29 MED ORDER — ACETAMINOPHEN 325 MG PO TABS
650.0000 mg | ORAL_TABLET | Freq: Once | ORAL | Status: AC
  Administered 2020-11-29: 650 mg via ORAL
  Filled 2020-11-29: qty 2

## 2020-11-29 NOTE — Assessment & Plan Note (Addendum)
#  Focal segmental glomera sclerosis-failed steroids.  As per nephrology currently on  rituximab 370 mg/m infusion weekly x4.  Currently s/p 1 infusion of rituximab.  Tolerated well.  Proceed with treatment today; and 2 more weekly treatments.  Defer to nephrology for further work-up/follow-up.  # Diabetes/ Blood glucose [[facility], in [place]]-currently on Metformin.  Blood sugar today is 202.  Will reduce the dose of steroids to 4 mg; pretreatment.  Recommend checking blood sugars for the next 2 days closely  # s/p COVID EVUSHELD PROPHYLAXIS [mrach 2022]  # DISPOSITION: # Rituxan today # rituxan/alsb as scheduled # follow up as needed- Dr.B
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
## 2020-11-29 NOTE — Progress Notes (Signed)
Pt tolerated rapid ruxience without difficulty, VSS, d/ced home

## 2020-12-06 ENCOUNTER — Telehealth: Payer: Self-pay | Admitting: Internal Medicine

## 2020-12-06 ENCOUNTER — Other Ambulatory Visit: Payer: Self-pay | Admitting: Internal Medicine

## 2020-12-06 ENCOUNTER — Inpatient Hospital Stay: Payer: BC Managed Care – PPO | Attending: Internal Medicine

## 2020-12-06 ENCOUNTER — Inpatient Hospital Stay: Payer: BC Managed Care – PPO

## 2020-12-06 VITALS — BP 121/74 | HR 88 | Temp 96.9°F | Resp 16 | Wt 285.6 lb

## 2020-12-06 DIAGNOSIS — N009 Acute nephritic syndrome with unspecified morphologic changes: Secondary | ICD-10-CM | POA: Insufficient documentation

## 2020-12-06 DIAGNOSIS — N032 Chronic nephritic syndrome with diffuse membranous glomerulonephritis: Secondary | ICD-10-CM

## 2020-12-06 DIAGNOSIS — Z5112 Encounter for antineoplastic immunotherapy: Secondary | ICD-10-CM | POA: Diagnosis present

## 2020-12-06 LAB — CBC WITH DIFFERENTIAL/PLATELET
Abs Immature Granulocytes: 0.07 10*3/uL (ref 0.00–0.07)
Basophils Absolute: 0.1 10*3/uL (ref 0.0–0.1)
Basophils Relative: 1 %
Eosinophils Absolute: 0.4 10*3/uL (ref 0.0–0.5)
Eosinophils Relative: 3 %
HCT: 32.7 % — ABNORMAL LOW (ref 39.0–52.0)
Hemoglobin: 10.7 g/dL — ABNORMAL LOW (ref 13.0–17.0)
Immature Granulocytes: 1 %
Lymphocytes Relative: 14 %
Lymphs Abs: 1.8 10*3/uL (ref 0.7–4.0)
MCH: 32.2 pg (ref 26.0–34.0)
MCHC: 32.7 g/dL (ref 30.0–36.0)
MCV: 98.5 fL (ref 80.0–100.0)
Monocytes Absolute: 1.1 10*3/uL — ABNORMAL HIGH (ref 0.1–1.0)
Monocytes Relative: 8 %
Neutro Abs: 9.4 10*3/uL — ABNORMAL HIGH (ref 1.7–7.7)
Neutrophils Relative %: 73 %
Platelets: 219 10*3/uL (ref 150–400)
RBC: 3.32 MIL/uL — ABNORMAL LOW (ref 4.22–5.81)
RDW: 13 % (ref 11.5–15.5)
WBC: 12.7 10*3/uL — ABNORMAL HIGH (ref 4.0–10.5)
nRBC: 0 % (ref 0.0–0.2)

## 2020-12-06 LAB — COMPREHENSIVE METABOLIC PANEL
ALT: 21 U/L (ref 0–44)
AST: 24 U/L (ref 15–41)
Albumin: 2.8 g/dL — ABNORMAL LOW (ref 3.5–5.0)
Alkaline Phosphatase: 68 U/L (ref 38–126)
Anion gap: 12 (ref 5–15)
BUN: 87 mg/dL — ABNORMAL HIGH (ref 6–20)
CO2: 35 mmol/L — ABNORMAL HIGH (ref 22–32)
Calcium: 9.3 mg/dL (ref 8.9–10.3)
Chloride: 90 mmol/L — ABNORMAL LOW (ref 98–111)
Creatinine, Ser: 2.92 mg/dL — ABNORMAL HIGH (ref 0.61–1.24)
GFR, Estimated: 25 mL/min — ABNORMAL LOW (ref >60)
Glucose, Bld: 200 mg/dL — ABNORMAL HIGH (ref 70–99)
Potassium: 4.1 mmol/L (ref 3.5–5.1)
Sodium: 137 mmol/L (ref 135–145)
Total Bilirubin: 0.6 mg/dL (ref 0.3–1.2)
Total Protein: 5.9 g/dL — ABNORMAL LOW (ref 6.5–8.1)

## 2020-12-06 MED ORDER — DIPHENHYDRAMINE HCL 25 MG PO CAPS
50.0000 mg | ORAL_CAPSULE | Freq: Once | ORAL | Status: AC
  Administered 2020-12-06: 50 mg via ORAL
  Filled 2020-12-06: qty 2

## 2020-12-06 MED ORDER — ACETAMINOPHEN 325 MG PO TABS
650.0000 mg | ORAL_TABLET | Freq: Once | ORAL | Status: AC
  Administered 2020-12-06: 650 mg via ORAL
  Filled 2020-12-06: qty 2

## 2020-12-06 MED ORDER — DEXAMETHASONE SODIUM PHOSPHATE 10 MG/ML IJ SOLN
4.0000 mg | Freq: Once | INTRAMUSCULAR | Status: AC
  Administered 2020-12-06: 4 mg via INTRAVENOUS
  Filled 2020-12-06: qty 1

## 2020-12-06 MED ORDER — SODIUM CHLORIDE 0.9 % IV SOLN
Freq: Once | INTRAVENOUS | Status: AC
  Filled 2020-12-06: qty 250

## 2020-12-06 MED ORDER — SODIUM CHLORIDE 0.9 % IV SOLN
375.0000 mg/m2 | Freq: Once | INTRAVENOUS | Status: AC
  Administered 2020-12-06: 900 mg via INTRAVENOUS
  Filled 2020-12-06: qty 50

## 2020-12-06 NOTE — Telephone Encounter (Signed)
x

## 2020-12-07 ENCOUNTER — Telehealth: Payer: Self-pay | Admitting: Internal Medicine

## 2020-12-07 NOTE — Telephone Encounter (Signed)
On 4/1-spoke to patient regarding results of his renal function; slightly worsening.  Patient awaiting evaluation with nephrology next week.  Patient's nephrologist made aware of renal function.

## 2020-12-13 ENCOUNTER — Inpatient Hospital Stay: Payer: BC Managed Care – PPO

## 2020-12-13 ENCOUNTER — Other Ambulatory Visit: Payer: Self-pay

## 2020-12-13 VITALS — BP 138/63 | HR 82 | Temp 97.0°F | Resp 18 | Wt 284.5 lb

## 2020-12-13 DIAGNOSIS — N009 Acute nephritic syndrome with unspecified morphologic changes: Secondary | ICD-10-CM | POA: Diagnosis not present

## 2020-12-13 DIAGNOSIS — N032 Chronic nephritic syndrome with diffuse membranous glomerulonephritis: Secondary | ICD-10-CM

## 2020-12-13 LAB — CBC WITH DIFFERENTIAL/PLATELET
Abs Immature Granulocytes: 0.06 10*3/uL (ref 0.00–0.07)
Basophils Absolute: 0.1 10*3/uL (ref 0.0–0.1)
Basophils Relative: 1 %
Eosinophils Absolute: 0.3 10*3/uL (ref 0.0–0.5)
Eosinophils Relative: 3 %
HCT: 33.7 % — ABNORMAL LOW (ref 39.0–52.0)
Hemoglobin: 11.1 g/dL — ABNORMAL LOW (ref 13.0–17.0)
Immature Granulocytes: 1 %
Lymphocytes Relative: 14 %
Lymphs Abs: 1.8 10*3/uL (ref 0.7–4.0)
MCH: 32 pg (ref 26.0–34.0)
MCHC: 32.9 g/dL (ref 30.0–36.0)
MCV: 97.1 fL (ref 80.0–100.0)
Monocytes Absolute: 0.9 10*3/uL (ref 0.1–1.0)
Monocytes Relative: 7 %
Neutro Abs: 9.6 10*3/uL — ABNORMAL HIGH (ref 1.7–7.7)
Neutrophils Relative %: 74 %
Platelets: 215 10*3/uL (ref 150–400)
RBC: 3.47 MIL/uL — ABNORMAL LOW (ref 4.22–5.81)
RDW: 13 % (ref 11.5–15.5)
WBC: 12.8 10*3/uL — ABNORMAL HIGH (ref 4.0–10.5)
nRBC: 0 % (ref 0.0–0.2)

## 2020-12-13 LAB — COMPREHENSIVE METABOLIC PANEL
ALT: 18 U/L (ref 0–44)
AST: 21 U/L (ref 15–41)
Albumin: 2.9 g/dL — ABNORMAL LOW (ref 3.5–5.0)
Alkaline Phosphatase: 59 U/L (ref 38–126)
Anion gap: 13 (ref 5–15)
BUN: 83 mg/dL — ABNORMAL HIGH (ref 6–20)
CO2: 33 mmol/L — ABNORMAL HIGH (ref 22–32)
Calcium: 9.9 mg/dL (ref 8.9–10.3)
Chloride: 93 mmol/L — ABNORMAL LOW (ref 98–111)
Creatinine, Ser: 2.31 mg/dL — ABNORMAL HIGH (ref 0.61–1.24)
GFR, Estimated: 33 mL/min — ABNORMAL LOW (ref >60)
Glucose, Bld: 218 mg/dL — ABNORMAL HIGH (ref 70–99)
Potassium: 3.8 mmol/L (ref 3.5–5.1)
Sodium: 139 mmol/L (ref 135–145)
Total Bilirubin: 0.5 mg/dL (ref 0.3–1.2)
Total Protein: 6.5 g/dL (ref 6.5–8.1)

## 2020-12-13 MED ORDER — SODIUM CHLORIDE 0.9 % IV SOLN
Freq: Once | INTRAVENOUS | Status: AC
  Filled 2020-12-13: qty 250

## 2020-12-13 MED ORDER — SODIUM CHLORIDE 0.9 % IV SOLN
375.0000 mg/m2 | Freq: Once | INTRAVENOUS | Status: AC
  Administered 2020-12-13: 900 mg via INTRAVENOUS
  Filled 2020-12-13: qty 50

## 2020-12-13 MED ORDER — ACETAMINOPHEN 325 MG PO TABS
650.0000 mg | ORAL_TABLET | Freq: Once | ORAL | Status: AC
  Administered 2020-12-13: 650 mg via ORAL
  Filled 2020-12-13: qty 2

## 2020-12-13 MED ORDER — DIPHENHYDRAMINE HCL 25 MG PO CAPS
50.0000 mg | ORAL_CAPSULE | Freq: Once | ORAL | Status: AC
  Administered 2020-12-13: 50 mg via ORAL
  Filled 2020-12-13: qty 2

## 2020-12-13 MED ORDER — DEXAMETHASONE SODIUM PHOSPHATE 10 MG/ML IJ SOLN
4.0000 mg | Freq: Once | INTRAMUSCULAR | Status: AC
  Administered 2020-12-13: 4 mg via INTRAVENOUS
  Filled 2020-12-13: qty 1

## 2021-10-31 ENCOUNTER — Ambulatory Visit
Admission: RE | Admit: 2021-10-31 | Discharge: 2021-10-31 | Disposition: A | Discharge status: Home / Self Care | Payer: BC Managed Care – PPO | Source: Ambulatory Visit | Attending: Nephrology | Admitting: Nephrology

## 2021-10-31 ENCOUNTER — Other Ambulatory Visit: Payer: Self-pay

## 2021-10-31 VITALS — BP 136/70 | HR 81 | Temp 97.6°F | Resp 16

## 2021-10-31 DIAGNOSIS — N032 Chronic nephritic syndrome with diffuse membranous glomerulonephritis: Secondary | ICD-10-CM | POA: Diagnosis present

## 2021-10-31 LAB — CBC WITH DIFFERENTIAL/PLATELET
Abs Immature Granulocytes: 0.08 10*3/uL — ABNORMAL HIGH (ref 0.00–0.07)
Basophils Absolute: 0.1 10*3/uL (ref 0.0–0.1)
Basophils Relative: 1 %
Eosinophils Absolute: 0.3 10*3/uL (ref 0.0–0.5)
Eosinophils Relative: 3 %
HCT: 35.6 % — ABNORMAL LOW (ref 39.0–52.0)
Hemoglobin: 11.4 g/dL — ABNORMAL LOW (ref 13.0–17.0)
Immature Granulocytes: 1 %
Lymphocytes Relative: 20 %
Lymphs Abs: 2 10*3/uL (ref 0.7–4.0)
MCH: 28.5 pg (ref 26.0–34.0)
MCHC: 32 g/dL (ref 30.0–36.0)
MCV: 89 fL (ref 80.0–100.0)
Monocytes Absolute: 0.8 10*3/uL (ref 0.1–1.0)
Monocytes Relative: 8 %
Neutro Abs: 6.8 10*3/uL (ref 1.7–7.7)
Neutrophils Relative %: 67 %
Platelets: 269 10*3/uL (ref 150–400)
RBC: 4 MIL/uL — ABNORMAL LOW (ref 4.22–5.81)
RDW: 15.5 % (ref 11.5–15.5)
WBC: 10.1 10*3/uL (ref 4.0–10.5)
nRBC: 0 % (ref 0.0–0.2)

## 2021-10-31 LAB — COMPREHENSIVE METABOLIC PANEL
ALT: 20 U/L (ref 0–44)
AST: 25 U/L (ref 15–41)
Albumin: 2.8 g/dL — ABNORMAL LOW (ref 3.5–5.0)
Alkaline Phosphatase: 63 U/L (ref 38–126)
Anion gap: 11 (ref 5–15)
BUN: 37 mg/dL — ABNORMAL HIGH (ref 6–20)
CO2: 29 mmol/L (ref 22–32)
Calcium: 8.5 mg/dL — ABNORMAL LOW (ref 8.9–10.3)
Chloride: 94 mmol/L — ABNORMAL LOW (ref 98–111)
Creatinine, Ser: 1.86 mg/dL — ABNORMAL HIGH (ref 0.61–1.24)
GFR, Estimated: 43 mL/min — ABNORMAL LOW (ref >60)
Glucose, Bld: 291 mg/dL — ABNORMAL HIGH (ref 70–99)
Potassium: 3.3 mmol/L — ABNORMAL LOW (ref 3.5–5.1)
Sodium: 134 mmol/L — ABNORMAL LOW (ref 135–145)
Total Bilirubin: 0.5 mg/dL (ref 0.3–1.2)
Total Protein: 6.2 g/dL — ABNORMAL LOW (ref 6.5–8.1)

## 2021-10-31 MED ORDER — DIPHENHYDRAMINE HCL 25 MG PO CAPS
ORAL_CAPSULE | ORAL | Status: AC
  Filled 2021-10-31: qty 1

## 2021-10-31 MED ORDER — DIPHENHYDRAMINE HCL 25 MG PO CAPS: 50.0000 mg | ORAL_CAPSULE | Freq: Once | ORAL | Status: AC

## 2021-10-31 MED ORDER — SODIUM CHLORIDE 0.9 % IV SOLN: 1000.0000 mg | Freq: Once | INTRAVENOUS | Status: DC

## 2021-10-31 MED ORDER — DEXAMETHASONE SODIUM PHOSPHATE 10 MG/ML IJ SOLN: 4.0000 mg | Freq: Once | INTRAMUSCULAR | Status: AC

## 2021-10-31 MED ORDER — SODIUM CHLORIDE 0.9 % IV SOLN
1000.0000 mg | Freq: Once | INTRAVENOUS | Status: AC
  Administered 2021-10-31: 1000 mg via INTRAVENOUS
  Filled 2021-10-31: qty 100

## 2021-10-31 MED ORDER — ACETAMINOPHEN 325 MG PO TABS: 650.0000 mg | ORAL_TABLET | Freq: Once | ORAL | Status: AC

## 2021-10-31 MED ORDER — DIPHENHYDRAMINE HCL 25 MG PO CAPS
ORAL_CAPSULE | ORAL | Status: AC
  Administered 2021-10-31: 50 mg via ORAL
  Filled 2021-10-31: qty 1

## 2021-10-31 MED ORDER — DEXAMETHASONE SODIUM PHOSPHATE 10 MG/ML IJ SOLN
INTRAMUSCULAR | Status: AC
  Administered 2021-10-31: 4 mg via INTRAVENOUS
  Filled 2021-10-31: qty 1

## 2021-10-31 MED ORDER — ACETAMINOPHEN 325 MG PO TABS
ORAL_TABLET | ORAL | Status: AC
  Administered 2021-10-31: 650 mg via ORAL
  Filled 2021-10-31: qty 2

## 2021-10-31 NOTE — Discharge Instructions (Signed)
Next appointment 11/10/21 at 09:00.  Please call [phone] if you need to reschedule.

## 2021-11-10 ENCOUNTER — Other Ambulatory Visit: Payer: Self-pay

## 2021-11-10 ENCOUNTER — Ambulatory Visit
Admission: RE | Admit: 2021-11-10 | Discharge: 2021-11-10 | Disposition: A | Discharge status: Home / Self Care | Payer: BC Managed Care – PPO | Source: Ambulatory Visit | Attending: Nephrology | Admitting: Nephrology

## 2021-11-10 DIAGNOSIS — R809 Proteinuria, unspecified: Secondary | ICD-10-CM | POA: Insufficient documentation

## 2021-11-10 DIAGNOSIS — N032 Chronic nephritic syndrome with diffuse membranous glomerulonephritis: Secondary | ICD-10-CM

## 2021-11-10 LAB — CBC
HCT: 37.4 % — ABNORMAL LOW (ref 39.0–52.0)
Hemoglobin: 12.2 g/dL — ABNORMAL LOW (ref 13.0–17.0)
MCH: 28.9 pg (ref 26.0–34.0)
MCHC: 32.6 g/dL (ref 30.0–36.0)
MCV: 88.6 fL (ref 80.0–100.0)
Platelets: 254 10*3/uL (ref 150–400)
RBC: 4.22 MIL/uL (ref 4.22–5.81)
RDW: 15 % (ref 11.5–15.5)
WBC: 12.1 10*3/uL — ABNORMAL HIGH (ref 4.0–10.5)
nRBC: 0 % (ref 0.0–0.2)

## 2021-11-10 LAB — COMPREHENSIVE METABOLIC PANEL
ALT: 25 U/L (ref 0–44)
AST: 19 U/L (ref 15–41)
Albumin: 2.8 g/dL — ABNORMAL LOW (ref 3.5–5.0)
Alkaline Phosphatase: 74 U/L (ref 38–126)
Anion gap: 12 (ref 5–15)
BUN: 73 mg/dL — ABNORMAL HIGH (ref 6–20)
CO2: 35 mmol/L — ABNORMAL HIGH (ref 22–32)
Calcium: 9.2 mg/dL (ref 8.9–10.3)
Chloride: 88 mmol/L — ABNORMAL LOW (ref 98–111)
Creatinine, Ser: 2.14 mg/dL — ABNORMAL HIGH (ref 0.61–1.24)
GFR, Estimated: 36 mL/min — ABNORMAL LOW (ref >60)
Glucose, Bld: 307 mg/dL — ABNORMAL HIGH (ref 70–99)
Potassium: 3.7 mmol/L (ref 3.5–5.1)
Sodium: 135 mmol/L (ref 135–145)
Total Bilirubin: 0.7 mg/dL (ref 0.3–1.2)
Total Protein: 6.2 g/dL — ABNORMAL LOW (ref 6.5–8.1)

## 2021-11-10 MED ORDER — DIPHENHYDRAMINE HCL 25 MG PO CAPS
50.0000 mg | ORAL_CAPSULE | Freq: Once | ORAL | Status: AC
Start: 2021-11-10 — End: 2021-11-10

## 2021-11-10 MED ORDER — ACETAMINOPHEN 325 MG PO TABS: 650.0000 mg | ORAL_TABLET | Freq: Once | ORAL | Status: AC

## 2021-11-10 MED ORDER — SODIUM CHLORIDE 0.9 % IV SOLN
1000.0000 mg | Freq: Once | INTRAVENOUS | Status: DC
  Filled 2021-11-10: qty 100

## 2021-11-10 MED ORDER — DEXAMETHASONE SODIUM PHOSPHATE 10 MG/ML IJ SOLN
INTRAMUSCULAR | Status: AC
  Administered 2021-11-10: 4 mg via INTRAVENOUS
  Filled 2021-11-10: qty 1

## 2021-11-10 MED ORDER — DIPHENHYDRAMINE HCL 25 MG PO CAPS
ORAL_CAPSULE | ORAL | Status: AC
  Administered 2021-11-10: 50 mg via ORAL
  Filled 2021-11-10: qty 2

## 2021-11-10 MED ORDER — ACETAMINOPHEN 325 MG PO TABS
ORAL_TABLET | ORAL | Status: AC
  Administered 2021-11-10: 650 mg via ORAL
  Filled 2021-11-10: qty 2

## 2021-11-10 MED ORDER — SODIUM CHLORIDE 0.9 % IV SOLN
1000.0000 mg | Freq: Once | INTRAVENOUS | Status: AC
  Administered 2021-11-10: 1000 mg via INTRAVENOUS
  Filled 2021-11-10: qty 100

## 2021-11-10 MED ORDER — DEXAMETHASONE SODIUM PHOSPHATE 10 MG/ML IJ SOLN: 4.0000 mg | Freq: Once | INTRAMUSCULAR | Status: AC

## 2022-03-30 ENCOUNTER — Other Ambulatory Visit: Payer: Self-pay

## 2022-09-30 ENCOUNTER — Emergency Department: Payer: BC Managed Care – PPO

## 2022-09-30 ENCOUNTER — Inpatient Hospital Stay
Admission: EM | Admit: 2022-09-30 | Discharge: 2022-10-03 | DRG: 683 | Disposition: A | Discharge status: Home / Self Care | Payer: BC Managed Care – PPO | Attending: Osteopathic Medicine | Admitting: Osteopathic Medicine

## 2022-09-30 ENCOUNTER — Other Ambulatory Visit: Payer: Self-pay

## 2022-09-30 ENCOUNTER — Encounter: Payer: Self-pay | Admitting: Internal Medicine

## 2022-09-30 DIAGNOSIS — I10 Essential (primary) hypertension: Secondary | ICD-10-CM | POA: Diagnosis not present

## 2022-09-30 DIAGNOSIS — E785 Hyperlipidemia, unspecified: Secondary | ICD-10-CM

## 2022-09-30 DIAGNOSIS — Z6841 Body Mass Index (BMI) 40.0 and over, adult: Secondary | ICD-10-CM

## 2022-09-30 DIAGNOSIS — Z8249 Family history of ischemic heart disease and other diseases of the circulatory system: Secondary | ICD-10-CM

## 2022-09-30 DIAGNOSIS — N032 Chronic nephritic syndrome with diffuse membranous glomerulonephritis: Secondary | ICD-10-CM | POA: Diagnosis not present

## 2022-09-30 DIAGNOSIS — N184 Chronic kidney disease, stage 4 (severe): Secondary | ICD-10-CM

## 2022-09-30 DIAGNOSIS — E861 Hypovolemia: Secondary | ICD-10-CM | POA: Diagnosis present

## 2022-09-30 DIAGNOSIS — Z7984 Long term (current) use of oral hypoglycemic drugs: Secondary | ICD-10-CM

## 2022-09-30 DIAGNOSIS — E860 Dehydration: Secondary | ICD-10-CM | POA: Diagnosis present

## 2022-09-30 DIAGNOSIS — I129 Hypertensive chronic kidney disease with stage 1 through stage 4 chronic kidney disease, or unspecified chronic kidney disease: Secondary | ICD-10-CM | POA: Diagnosis present

## 2022-09-30 DIAGNOSIS — Z888 Allergy status to other drugs, medicaments and biological substances status: Secondary | ICD-10-CM

## 2022-09-30 DIAGNOSIS — N179 Acute kidney failure, unspecified: Secondary | ICD-10-CM | POA: Diagnosis not present

## 2022-09-30 DIAGNOSIS — Z1152 Encounter for screening for COVID-19: Secondary | ICD-10-CM

## 2022-09-30 DIAGNOSIS — M79601 Pain in right arm: Secondary | ICD-10-CM | POA: Diagnosis not present

## 2022-09-30 DIAGNOSIS — E66813 Obesity, class 3: Secondary | ICD-10-CM | POA: Diagnosis present

## 2022-09-30 DIAGNOSIS — M109 Gout, unspecified: Secondary | ICD-10-CM | POA: Diagnosis present

## 2022-09-30 DIAGNOSIS — E1129 Type 2 diabetes mellitus with other diabetic kidney complication: Secondary | ICD-10-CM | POA: Diagnosis present

## 2022-09-30 DIAGNOSIS — E1122 Type 2 diabetes mellitus with diabetic chronic kidney disease: Secondary | ICD-10-CM | POA: Diagnosis present

## 2022-09-30 DIAGNOSIS — Z7982 Long term (current) use of aspirin: Secondary | ICD-10-CM

## 2022-09-30 DIAGNOSIS — E876 Hypokalemia: Secondary | ICD-10-CM

## 2022-09-30 DIAGNOSIS — Z7682 Awaiting organ transplant status: Secondary | ICD-10-CM

## 2022-09-30 DIAGNOSIS — D631 Anemia in chronic kidney disease: Secondary | ICD-10-CM | POA: Diagnosis present

## 2022-09-30 DIAGNOSIS — Z85828 Personal history of other malignant neoplasm of skin: Secondary | ICD-10-CM

## 2022-09-30 DIAGNOSIS — R748 Abnormal levels of other serum enzymes: Secondary | ICD-10-CM

## 2022-09-30 DIAGNOSIS — T502X5A Adverse effect of carbonic-anhydrase inhibitors, benzothiadiazides and other diuretics, initial encounter: Secondary | ICD-10-CM | POA: Diagnosis present

## 2022-09-30 LAB — COMPREHENSIVE METABOLIC PANEL
ALT: 19 U/L (ref 0–44)
AST: 21 U/L (ref 15–41)
Albumin: 2.5 g/dL — ABNORMAL LOW (ref 3.5–5.0)
Alkaline Phosphatase: 43 U/L (ref 38–126)
Anion gap: 10 (ref 5–15)
BUN: 84 mg/dL — ABNORMAL HIGH (ref 6–20)
CO2: 24 mmol/L (ref 22–32)
Calcium: 7.9 mg/dL — ABNORMAL LOW (ref 8.9–10.3)
Chloride: 103 mmol/L (ref 98–111)
Creatinine, Ser: 4.25 mg/dL — ABNORMAL HIGH (ref 0.61–1.24)
GFR, Estimated: 16 mL/min — ABNORMAL LOW (ref >60)
Glucose, Bld: 101 mg/dL — ABNORMAL HIGH (ref 70–99)
Potassium: 3.4 mmol/L — ABNORMAL LOW (ref 3.5–5.1)
Sodium: 137 mmol/L (ref 135–145)
Total Bilirubin: 0.6 mg/dL (ref 0.3–1.2)
Total Protein: 5.7 g/dL — ABNORMAL LOW (ref 6.5–8.1)

## 2022-09-30 LAB — URINALYSIS, ROUTINE W REFLEX MICROSCOPIC
Bilirubin Urine: NEGATIVE
Glucose, UA: NEGATIVE mg/dL
Hgb urine dipstick: NEGATIVE
Ketones, ur: NEGATIVE mg/dL
Leukocytes,Ua: NEGATIVE
Nitrite: NEGATIVE
Protein, ur: NEGATIVE mg/dL
Specific Gravity, Urine: 1.028 (ref 1.005–1.030)
pH: 5 (ref 5.0–8.0)

## 2022-09-30 LAB — CBC
HCT: 34.6 % — ABNORMAL LOW (ref 39.0–52.0)
Hemoglobin: 11.2 g/dL — ABNORMAL LOW (ref 13.0–17.0)
MCH: 29.2 pg (ref 26.0–34.0)
MCHC: 32.4 g/dL (ref 30.0–36.0)
MCV: 90.3 fL (ref 80.0–100.0)
Platelets: 243 10*3/uL (ref 150–400)
RBC: 3.83 MIL/uL — ABNORMAL LOW (ref 4.22–5.81)
RDW: 13.9 % (ref 11.5–15.5)
WBC: 8 10*3/uL (ref 4.0–10.5)
nRBC: 0 % (ref 0.0–0.2)

## 2022-09-30 LAB — LIPASE, BLOOD: Lipase: 138 U/L — ABNORMAL HIGH (ref 11–51)

## 2022-09-30 LAB — GLUCOSE, CAPILLARY: Glucose-Capillary: 112 mg/dL — ABNORMAL HIGH (ref 70–99)

## 2022-09-30 MED ORDER — FENOFIBRATE 160 MG PO TABS: 160.0000 mg | ORAL_TABLET | Freq: Every day | ORAL | Status: DC

## 2022-09-30 MED ORDER — HEPARIN SODIUM (PORCINE) 5000 UNIT/ML IJ SOLN
5000.0000 [IU] | Freq: Three times a day (TID) | INTRAMUSCULAR | Status: DC
  Filled 2022-09-30 (×2): qty 1

## 2022-09-30 MED ORDER — FEBUXOSTAT 40 MG PO TABS
40.0000 mg | ORAL_TABLET | Freq: Every day | ORAL | Status: DC
  Administered 2022-10-01 – 2022-10-03 (×3): 40 mg via ORAL
  Filled 2022-09-30 (×4): qty 1

## 2022-09-30 MED ORDER — ACETAMINOPHEN 325 MG PO TABS
650.0000 mg | ORAL_TABLET | Freq: Four times a day (QID) | ORAL | Status: DC | PRN
  Administered 2022-10-02 – 2022-10-03 (×3): 650 mg via ORAL
  Filled 2022-09-30 (×3): qty 2

## 2022-09-30 MED ORDER — CARVEDILOL 6.25 MG PO TABS
6.2500 mg | ORAL_TABLET | Freq: Two times a day (BID) | ORAL | Status: DC
  Administered 2022-09-30 – 2022-10-03 (×6): 6.25 mg via ORAL
  Filled 2022-09-30 (×6): qty 1

## 2022-09-30 MED ORDER — OYSTER SHELL CALCIUM/D3 500-5 MG-MCG PO TABS
1.0000 | ORAL_TABLET | Freq: Every day | ORAL | Status: DC
  Administered 2022-10-01 – 2022-10-03 (×3): 1 via ORAL
  Filled 2022-09-30 (×3): qty 1

## 2022-09-30 MED ORDER — SODIUM CHLORIDE 0.9 % IV SOLN: Freq: Once | INTRAVENOUS | Status: AC

## 2022-09-30 MED ORDER — SODIUM CHLORIDE 0.9 % IV SOLN: INTRAVENOUS | Status: AC

## 2022-09-30 MED ORDER — INSULIN ASPART 100 UNIT/ML IJ SOLN: 0.0000 [IU] | Freq: Every day | INTRAMUSCULAR | Status: DC

## 2022-09-30 MED ORDER — OMEGA-3-ACID ETHYL ESTERS 1 G PO CAPS
2.0000 | ORAL_CAPSULE | Freq: Two times a day (BID) | ORAL | Status: DC
  Administered 2022-09-30 – 2022-10-03 (×6): 2 g via ORAL
  Filled 2022-09-30 (×6): qty 2

## 2022-09-30 MED ORDER — ONDANSETRON HCL 4 MG/2ML IJ SOLN: 4.0000 mg | Freq: Three times a day (TID) | INTRAMUSCULAR | Status: DC | PRN

## 2022-09-30 MED ORDER — INSULIN ASPART 100 UNIT/ML IJ SOLN
0.0000 [IU] | Freq: Three times a day (TID) | INTRAMUSCULAR | Status: DC
  Administered 2022-10-01: 2 [IU] via SUBCUTANEOUS
  Administered 2022-10-01 – 2022-10-02 (×2): 1 [IU] via SUBCUTANEOUS
  Filled 2022-09-30 (×3): qty 1

## 2022-09-30 MED ORDER — SENNOSIDES-DOCUSATE SODIUM 8.6-50 MG PO TABS
1.0000 | ORAL_TABLET | Freq: Every day | ORAL | Status: DC
  Administered 2022-10-01 – 2022-10-03 (×3): 1 via ORAL
  Filled 2022-09-30 (×3): qty 1

## 2022-09-30 MED ORDER — HYDRALAZINE HCL 20 MG/ML IJ SOLN: 5.0000 mg | INTRAMUSCULAR | Status: DC | PRN

## 2022-09-30 MED ORDER — LORATADINE 10 MG PO TABS: 10.0000 mg | ORAL_TABLET | Freq: Every day | ORAL | Status: DC | PRN

## 2022-09-30 MED ORDER — ADULT MULTIVITAMIN W/MINERALS CH
1.0000 | ORAL_TABLET | Freq: Every day | ORAL | Status: DC
  Administered 2022-10-01 – 2022-10-03 (×3): 1 via ORAL
  Filled 2022-09-30 (×3): qty 1

## 2022-09-30 MED ORDER — AMLODIPINE BESYLATE 10 MG PO TABS
10.0000 mg | ORAL_TABLET | Freq: Every day | ORAL | Status: DC
  Administered 2022-09-30 – 2022-10-03 (×4): 10 mg via ORAL
  Filled 2022-09-30 (×4): qty 1

## 2022-09-30 MED ORDER — ASPIRIN 81 MG PO CHEW
81.0000 mg | CHEWABLE_TABLET | Freq: Every day | ORAL | Status: DC
  Administered 2022-10-01 – 2022-10-03 (×3): 81 mg via ORAL
  Filled 2022-09-30 (×3): qty 1
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
## 2022-09-30 NOTE — ED Notes (Signed)
Two unsuccessful IV attemtps. Dr. Jori Moll aware.

## 2022-09-30 NOTE — ED Provider Notes (Signed)
[facility] Provider Note    Event Date/Time   First MD Initiated Contact with Patient 09/30/22 1509     (approximate)   History   Abnormal Lab   HPI  Russell Grant is a 52 y.o. male with past medical history significant for FSGS, CKD stage IV, who presents to the emergency department with elevation of his creatinine.  Patient states that he has been having issues with his creatinine elevation over the past couple of weeks.  States that he was told today to come into the emergency department for some fluids and to recheck his glucose.  Is on transplant list with [facility] as of Monday given his low GFR with A1c.  Patient is followed by [name] with urology.  Endorses normal urine output.  Does endorse some lower back pain that is worse on the left side.  Denies any nausea vomiting or diarrhea.     Physical Exam   Triage Vital Signs: ED Triage Vitals  Enc Vitals Group     BP 09/30/22 1130 (!) 167/71     Pulse Rate 09/30/22 1130 77     Resp 09/30/22 1130 18     Temp 09/30/22 1130 98 F (36.7 C)     Temp src --      SpO2 09/30/22 1130 98 %     Weight --      Height --      Head Circumference --      Peak Flow --      Pain Score 09/30/22 1129 3     Pain Loc --      Pain Edu? --      Excl. in [place]? --     Most recent vital signs: Vitals:   09/30/22 1515 09/30/22 1700  BP: (!) 160/71 (!) 158/70  Pulse: 77 74  Resp: (!) 22 18  Temp:  98 F (36.7 C)  SpO2: 98% 99%    Physical Exam Constitutional:      Appearance: He is well-developed.  HENT:     Head: Atraumatic.  Eyes:     Conjunctiva/sclera: Conjunctivae normal.  Cardiovascular:     Rate and Rhythm: Regular rhythm.  Pulmonary:     Effort: No respiratory distress.  Abdominal:     General: There is no distension.     Tenderness: There is no right CVA tenderness or left CVA tenderness.  Musculoskeletal:        General: Normal range of motion.     Cervical back: Normal range of motion.      Right lower leg: No edema.     Left lower leg: No edema.  Skin:    General: Skin is warm.     Capillary Refill: Capillary refill takes less than 2 seconds.  Neurological:     Mental Status: He is alert. Mental status is at baseline.     IMPRESSION / MDM / ASSESSMENT AND PLAN / ED COURSE  I reviewed the triage vital signs and the nursing notes.  On chart review patient has had progressively worsening creatinine since 07/23/2022.  Patient had an elevation of his creatinine up to 3.3 and has been managed on cyclosporine over the past 2 weeks.  Repeat lab work done as an outpatient with told to come to the emergency department given significantly decreased of function.  His diuretics he has been taking once a day instead of twice a day as instructed.   Differential diagnosis including prerenal from dehydration, postrenal,  progression of his disease    No tachycardic or bradycardic dysrhythmias while on cardiac telemetry.  RADIOLOGY I independently reviewed imaging, my interpretation of imaging: CT Noncon given left flank pain, lower back pain and worsening kidney function  CT scan with no postobstructive process notified.  LABS (all labs ordered are listed, but only abnormal results are displayed) Labs interpreted as -  Creatinine elevated up to 4.25 with a BUN elevated at 84.  Creatinine 3.4.  Does have a low albumin of 2.5.  Lipase is elevated at 138.  Labs Reviewed  LIPASE, BLOOD - Abnormal; Notable for the following components:      Result Value   Lipase 138 (*)    All other components within normal limits  COMPREHENSIVE METABOLIC PANEL - Abnormal; Notable for the following components:   Potassium 3.4 (*)    Glucose, Bld 101 (*)    BUN 84 (*)    Creatinine, Ser 4.25 (*)    Calcium 7.9 (*)    Total Protein 5.7 (*)    Albumin 2.5 (*)    GFR, Estimated 16 (*)    All other components within normal limits  CBC - Abnormal; Notable for the following components:   RBC  3.83 (*)    Hemoglobin 11.2 (*)    HCT 34.6 (*)    All other components within normal limits  URINALYSIS, ROUTINE W REFLEX MICROSCOPIC - Abnormal; Notable for the following components:   Color, Urine YELLOW (*)    APPearance CLEAR (*)    All other components within normal limits    TREATMENT   Treated with 1 L of IV fluids  Consulted nephrology and discussed patient's case with [name], recommended admission with hydration at 75 an hour of normal saline.  Consulted and admitted to the hospitalist for IV hydration and further workup for acute on chronic kidney injury.   PROCEDURES:  Critical Care performed: No  Procedures  Patient's presentation is most consistent with acute presentation with potential threat to life or bodily function.   MEDICATIONS ORDERED IN ED: Medications  0.9 %  sodium chloride infusion (has no administration in time range)    FINAL CLINICAL IMPRESSION(S) / ED DIAGNOSES   Final diagnoses:  AKI (acute kidney injury) ([place])     Rx / DC Orders   ED Discharge Orders     None        Note:  This document was prepared using Dragon voice recognition software and may include unintentional dictation errors.   Nathaniel Man, MD 09/30/22 1715

## 2022-09-30 NOTE — ED Triage Notes (Signed)
Pt comes with c/op abnormal labs. Pt states his PCP sent him here due to kidney function. Pt states little headache.

## 2022-10-01 DIAGNOSIS — M1A9XX0 Chronic gout, unspecified, without tophus (tophi): Secondary | ICD-10-CM

## 2022-10-01 DIAGNOSIS — Z1152 Encounter for screening for COVID-19: Secondary | ICD-10-CM | POA: Diagnosis not present

## 2022-10-01 DIAGNOSIS — T502X5A Adverse effect of carbonic-anhydrase inhibitors, benzothiadiazides and other diuretics, initial encounter: Secondary | ICD-10-CM | POA: Diagnosis present

## 2022-10-01 DIAGNOSIS — Z8249 Family history of ischemic heart disease and other diseases of the circulatory system: Secondary | ICD-10-CM | POA: Diagnosis not present

## 2022-10-01 DIAGNOSIS — E860 Dehydration: Secondary | ICD-10-CM | POA: Diagnosis present

## 2022-10-01 DIAGNOSIS — I129 Hypertensive chronic kidney disease with stage 1 through stage 4 chronic kidney disease, or unspecified chronic kidney disease: Secondary | ICD-10-CM | POA: Diagnosis present

## 2022-10-01 DIAGNOSIS — Z888 Allergy status to other drugs, medicaments and biological substances status: Secondary | ICD-10-CM | POA: Diagnosis not present

## 2022-10-01 DIAGNOSIS — Z7682 Awaiting organ transplant status: Secondary | ICD-10-CM | POA: Diagnosis not present

## 2022-10-01 DIAGNOSIS — N184 Chronic kidney disease, stage 4 (severe): Secondary | ICD-10-CM | POA: Diagnosis present

## 2022-10-01 DIAGNOSIS — Z85828 Personal history of other malignant neoplasm of skin: Secondary | ICD-10-CM | POA: Diagnosis not present

## 2022-10-01 DIAGNOSIS — Z7982 Long term (current) use of aspirin: Secondary | ICD-10-CM | POA: Diagnosis not present

## 2022-10-01 DIAGNOSIS — N179 Acute kidney failure, unspecified: Secondary | ICD-10-CM | POA: Diagnosis present

## 2022-10-01 DIAGNOSIS — E861 Hypovolemia: Secondary | ICD-10-CM | POA: Diagnosis present

## 2022-10-01 DIAGNOSIS — Z7984 Long term (current) use of oral hypoglycemic drugs: Secondary | ICD-10-CM | POA: Diagnosis not present

## 2022-10-01 DIAGNOSIS — Z6841 Body Mass Index (BMI) 40.0 and over, adult: Secondary | ICD-10-CM | POA: Diagnosis not present

## 2022-10-01 DIAGNOSIS — E1129 Type 2 diabetes mellitus with other diabetic kidney complication: Secondary | ICD-10-CM

## 2022-10-01 DIAGNOSIS — E876 Hypokalemia: Secondary | ICD-10-CM | POA: Diagnosis present

## 2022-10-01 DIAGNOSIS — R748 Abnormal levels of other serum enzymes: Secondary | ICD-10-CM | POA: Diagnosis present

## 2022-10-01 DIAGNOSIS — M79601 Pain in right arm: Secondary | ICD-10-CM | POA: Diagnosis not present

## 2022-10-01 DIAGNOSIS — D631 Anemia in chronic kidney disease: Secondary | ICD-10-CM | POA: Diagnosis present

## 2022-10-01 DIAGNOSIS — M109 Gout, unspecified: Secondary | ICD-10-CM | POA: Diagnosis present

## 2022-10-01 DIAGNOSIS — E1122 Type 2 diabetes mellitus with diabetic chronic kidney disease: Secondary | ICD-10-CM | POA: Diagnosis present

## 2022-10-01 DIAGNOSIS — E785 Hyperlipidemia, unspecified: Secondary | ICD-10-CM | POA: Diagnosis present

## 2022-10-01 LAB — LIPASE, BLOOD: Lipase: 186 U/L — ABNORMAL HIGH (ref 11–51)

## 2022-10-01 LAB — BASIC METABOLIC PANEL
Anion gap: 7 (ref 5–15)
BUN: 83 mg/dL — ABNORMAL HIGH (ref 6–20)
CO2: 22 mmol/L (ref 22–32)
Calcium: 7.8 mg/dL — ABNORMAL LOW (ref 8.9–10.3)
Chloride: 111 mmol/L (ref 98–111)
Creatinine, Ser: 4.03 mg/dL — ABNORMAL HIGH (ref 0.61–1.24)
GFR, Estimated: 17 mL/min — ABNORMAL LOW (ref >60)
Glucose, Bld: 96 mg/dL (ref 70–99)
Potassium: 3.8 mmol/L (ref 3.5–5.1)
Sodium: 140 mmol/L (ref 135–145)

## 2022-10-01 LAB — GLUCOSE, CAPILLARY
Glucose-Capillary: 137 mg/dL — ABNORMAL HIGH (ref 70–99)
Glucose-Capillary: 125 mg/dL — ABNORMAL HIGH (ref 70–99)
Glucose-Capillary: 103 mg/dL — ABNORMAL HIGH (ref 70–99)
Glucose-Capillary: 152 mg/dL — ABNORMAL HIGH (ref 70–99)

## 2022-10-01 LAB — URIC ACID: Uric Acid, Serum: 6.1 mg/dL (ref 3.7–8.6)

## 2022-10-01 LAB — HIV ANTIBODY (ROUTINE TESTING W REFLEX): HIV Screen 4th Generation wRfx: NONREACTIVE

## 2022-10-01 MED ORDER — SODIUM CHLORIDE 0.9 % IV SOLN: INTRAVENOUS | Status: DC

## 2022-10-01 MED ORDER — ALBUMIN HUMAN 25 % IV SOLN
25.0000 g | Freq: Two times a day (BID) | INTRAVENOUS | Status: DC
  Administered 2022-10-01 – 2022-10-03 (×5): 25 g via INTRAVENOUS
  Filled 2022-10-01 (×5): qty 100

## 2022-10-01 NOTE — Progress Notes (Signed)
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  Patient presents to the emergency department at the advice of his nephrologist due to abnormal labs.  Patient has been admitted for AKI (acute kidney injury) ([place]) [N17.9] Acute renal failure superimposed on stage 4 chronic kidney disease ([place]) [N17.9, N18.4]   Patient is known to our practice and is followed outpatient by [name].  Patient was seen in office and it was noted that patient underwent a significant decrease in renal function, GFR 30-12.  Patient was advised to present to emergency department for further evaluation.  Patient is seen and evaluated at bedside.  Wife at bedside.  Patient appears to be resting comfortably.  Room air.  States he generally feels well.  Denies nausea or vomiting.  Trace lower extremity edema noted.  Currently denies any uremic symptoms at this time.  Labs on ED arrival include sodium 137, potassium 3.4, glucose 101, BUN 84, creatinine 4.25 with GFR 16.  Hemoglobin 11.2.UA appears negative.  CT abd/pelvis negative for obstruction.   We have been consulted to manage acute kidney injury.   Objective:  Vital signs in last 24 hours:  Temp:  [98 F (36.7 C)-99.4 F (37.4 C)] 98.4 F (36.9 C) (01/25 0908) Pulse Rate:  [74-85] 78 (01/25 0908) Resp:  [16-22] 18 (01/25 0908) BP: (135-164)/(59-80) 136/68 (01/25 0908) SpO2:  [98 %-100 %] 100 % (01/25 0908) Weight:  [132.3 kg] 132.3 kg (01/24 1834)  Weight change:  Filed Weights   09/30/22 1834  Weight: 132.3 kg    Intake/Output: I/O last 3 completed shifts: In: 835.4 [P.O.:184; I.V.:651.4] Out: 300 [Urine:300]   Intake/Output this shift:  Total I/O In: 240 [P.O.:240] Out: -   Physical Exam: General: NAD  Head: Normocephalic, atraumatic. Moist oral mucosal membranes   Eyes: Anicteric  Lungs:  Clear to auscultation, normal effort, room air  Heart: Regular rate and rhythm  Abdomen:  Soft, nontender  Extremities: Trace peripheral edema.  Neurologic: Nonfocal, moving all four extremities  Skin: No lesions  Access: None    Basic Metabolic Panel: Recent Labs  Lab 09/30/22 1130 10/01/22 0541  NA 137 140  K 3.4* 3.8  CL 103 111  CO2 24 22  GLUCOSE 101* 96  BUN 84* 83*  CREATININE 4.25* 4.03*  CALCIUM 7.9* 7.8*    Liver Function Tests: Recent Labs  Lab 09/30/22 1130  AST 21  ALT 19  ALKPHOS 43  BILITOT 0.6  PROT 5.7*  ALBUMIN 2.5*   Recent Labs  Lab 09/30/22 1130 10/01/22 0541  LIPASE 138* 186*   No results for input(s): "AMMONIA" in the last 168 hours.  CBC: Recent Labs  Lab 09/30/22 1130  WBC 8.0  HGB 11.2*  HCT 34.6*  MCV 90.3  PLT 243    Cardiac Enzymes: No results for input(s): "CKTOTAL", "CKMB", "CKMBINDEX", "TROPONINI" in the last 168 hours.  BNP: Invalid input(s): "POCBNP"  CBG: Recent Labs  Lab 09/30/22 2105 10/01/22 0942 10/01/22 1233  GLUCAP 112* 137* 125*    Microbiology: Results for orders placed or performed during the hospital encounter of 08/04/20  Resp Panel by RT-PCR (Flu A&B, Covid) Nasopharyngeal Swab     Status: None   Collection Time: 08/04/20  5:07 AM   Specimen: Nasopharyngeal Swab; Nasopharyngeal(NP) swabs in vial transport medium  Result Value Ref Range Status  SARS Coronavirus 2 by RT PCR NEGATIVE NEGATIVE Final    Comment: (NOTE) SARS-CoV-2 target nucleic acids are NOT DETECTED.  The SARS-CoV-2 RNA is generally detectable in upper respiratory specimens during the acute phase of infection. The lowest concentration of SARS-CoV-2 viral copies this assay can detect is 138 copies/mL. A negative result does not preclude SARS-Cov-2 infection and should not be used as the sole basis for treatment or other patient management decisions. A negative result may occur with  improper  specimen collection/handling, submission of specimen other than nasopharyngeal swab, presence of viral mutation(s) within the areas targeted by this assay, and inadequate number of viral copies(<138 copies/mL). A negative result must be combined with clinical observations, patient history, and epidemiological information. The expected result is Negative.  Fact Sheet for Patients:  EntrepreneurPulse.com.au  Fact Sheet for Healthcare Providers:  IncredibleEmployment.be  This test is no t yet approved or cleared by the [place] FDA and  has been authorized for detection and/or diagnosis of SARS-CoV-2 by FDA under an Emergency Use Authorization (EUA). This EUA will remain  in effect (meaning this test can be used) for the duration of the COVID-19 declaration under Section 564(b)(1) of the Act, 21 U.S.C.section 360bbb-3(b)(1), unless the authorization is terminated  or revoked sooner.       Influenza A by PCR NEGATIVE NEGATIVE Final   Influenza B by PCR NEGATIVE NEGATIVE Final    Comment: (NOTE) The Xpert Xpress SARS-CoV-2/FLU/RSV plus assay is intended as an aid in the diagnosis of influenza from Nasopharyngeal swab specimens and should not be used as a sole basis for treatment. Nasal washings and aspirates are unacceptable for Xpert Xpress SARS-CoV-2/FLU/RSV testing.  Fact Sheet for Patients: EntrepreneurPulse.com.au  Fact Sheet for Healthcare Providers: IncredibleEmployment.be  This test is not yet approved or cleared by the [place] FDA and has been authorized for detection and/or diagnosis of SARS-CoV-2 by FDA under an Emergency Use Authorization (EUA). This EUA will remain in effect (meaning this test can be used) for the duration of the COVID-19 declaration under Section 564(b)(1) of the Act, 21 U.S.C. section 360bbb-3(b)(1), unless the authorization is terminated or revoked.  Performed at  [facility], [address] [place], [place] [address]   Culture, blood (routine x 2)     Status: None   Collection Time: 08/04/20  7:40 AM   Specimen: BLOOD  Result Value Ref Range Status   Specimen Description   Final    BLOOD LEFT ANTECUBITAL Performed at [facility], [address], [place], [place] [address]    Special Requests   Final    BOTTLES DRAWN AEROBIC AND ANAEROBIC Blood Culture results may not be optimal due to an excessive volume of blood received in culture bottles Performed at [facility], [address], [place], [place] [address]    Culture   Final    NO GROWTH 5 DAYS Performed at [facility], [place] [address], [place], [place] [address]    Report Status 08/09/2020 FINAL  Final  Culture, blood (routine x 2)     Status: None   Collection Time: 08/04/20  2:40 PM   Specimen: BLOOD  Result Value Ref Range Status   Specimen Description BLOOD RIGHT ANTECUBITAL  Final   Special Requests   Final    BOTTLES DRAWN AEROBIC AND ANAEROBIC Blood Culture results may not be optimal due to an inadequate volume of blood received in culture bottles   Culture   Final  NO GROWTH 5 DAYS Performed at [facility], [address] [place], [place] [address]    Report Status 08/09/2020 FINAL  Final    Coagulation Studies: No results for input(s): "LABPROT", "INR" in the last 72 hours.  Urinalysis: Recent Labs    09/30/22 1130  COLORURINE YELLOW*  LABSPEC 1.028  PHURINE 5.0  GLUCOSEU NEGATIVE  HGBUR NEGATIVE  BILIRUBINUR NEGATIVE  KETONESUR NEGATIVE  PROTEINUR NEGATIVE  NITRITE NEGATIVE  LEUKOCYTESUR NEGATIVE      Imaging: CT ABDOMEN PELVIS WO CONTRAST  Result Date: 09/30/2022 CLINICAL DATA:  Left lower quadrant abdominal pain. Left flank pain and elevated creatinine. EXAM: CT ABDOMEN AND PELVIS WITHOUT CONTRAST TECHNIQUE: Multidetector CT imaging of the abdomen and pelvis was performed following the standard  protocol without IV contrast. RADIATION DOSE REDUCTION: This exam was performed according to the departmental dose-optimization program which includes automated exposure control, adjustment of the mA and/or kV according to patient size and/or use of iterative reconstruction technique. COMPARISON:  Abdominal ultrasound 05/30/2020. FINDINGS: Lower chest: Clear lung bases.  No pleural or pericardial effusion. Hepatobiliary: No focal liver abnormality is seen. No gallstones, gallbladder wall thickening, or biliary dilatation. Pancreas: Unremarkable. No pancreatic ductal dilatation or surrounding inflammatory changes. Spleen: Normal in size without focal abnormality. Adrenals/Urinary Tract: Normal adrenal glands. Kidneys are unremarkable. No renal calculi or hydronephrosis. Bladder is unremarkable. Stomach/Bowel: Unremarkable stomach and duodenal. No dilated loops of small bowel. Normal appendix is visualized on coronal image 59 series 5. Decompressed colon. Mild sigmoid diverticulosis without evidence of acute diverticulitis. Normal rectum. Vascular/Lymphatic: No significant vascular findings are present. No enlarged abdominal or pelvic lymph nodes. Reproductive: Prostate is unremarkable. Other: No significant abdominal wall abnormality.  No ascites. Musculoskeletal: Multilevel lumbar spine degeneration. No suspicious bone lesions. IMPRESSION: 1. No acute abnormality in the abdomen or pelvis. 2. Mild sigmoid diverticulosis without evidence of acute diverticulitis. Electronically Signed   By: Emmit Alexanders M.D.   On: 09/30/2022 16:35     Medications:    sodium chloride 75 mL/hr at 10/01/22 0959   albumin human 25 g (10/01/22 1238)    amLODipine  10 mg Oral Daily   aspirin  81 mg Oral Daily   calcium-vitamin D  1 tablet Oral Daily   carvedilol  6.25 mg Oral BID   febuxostat  40 mg Oral Daily   heparin  5,000 Units Subcutaneous Q8H   insulin aspart  0-5 Units Subcutaneous QHS   insulin aspart  0-9 Units  Subcutaneous TID WC   multivitamin with minerals  1 tablet Oral Daily   omega-3 acid ethyl esters  2 capsule Oral BID   senna-docusate  1 tablet Oral Daily   acetaminophen, hydrALAZINE, loratadine, ondansetron (ZOFRAN) IV  Assessment/ Plan:  [name] is a 52 y.o.  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  Patient presents to the emergency department at the advice of his nephrologist due to abnormal labs.  Patient has been admitted for AKI (acute kidney injury) ([place]) [N17.9] Acute renal failure superimposed on stage 4 chronic kidney disease ([place]) [N17.9, N18.4]   Acute Kidney Injury on chronic kidney disease stage IV secondary to FSGS.  Baseline creatinine 3.03 and GFR of 24 on 07/23/2022.  Acute kidney injury secondary to aggressive diuresis versus progression of FSGS.  Patient prescribed torsemide.100 mg twice daily outpatient, currently held.  GFR noted to be 30 during recent office visit, however now shows to be  12.  Deterioration appears rapid for FSGS.  Likely more hypovolemia.  IV fluids initiated overnight and creatinine slightly improved today.  Will continue IV fluids for now and hold diuresis.  No acute need for dialysis at this time.  Continue to avoid nephrotoxic agents and therapies.  Will order patient albumin 25 g twice daily for volume support.  Lab Results  Component Value Date   CREATININE 4.03 (H) 10/01/2022   CREATININE 4.25 (H) 09/30/2022   CREATININE 2.14 (H) 11/10/2021    Intake/Output Summary (Last 24 hours) at 10/01/2022 1344 Last data filed at 10/01/2022 1126 Gross per 24 hour  Intake 1075.36 ml  Output 300 ml  Net 775.36 ml   2. Anemia of chronic kidney disease  Lab Results  Component Value Date   HGB 11.2 (L) 09/30/2022   Hemoglobin currently acceptable.  Will continue to monitor.  3.  Hypertension with chronic kidney disease.  Currently receiving  amlodipine and carvedilol.  4. Diabetes mellitus type II with chronic kidney disease/renal manifestations: noninsulin dependent. Home regimen includes farxiga. Most recent hemoglobin A1c is 7.1 on 08/06/22.      LOS: 0 Russell Grant 1/25/20241:44 PM

## 2022-10-01 NOTE — TOC Initial Note (Signed)
Transition of Care [facility]) - Initial/Assessment Note    Patient Details  Name: Russell Grant MRN: [id number] Date of Birth: 1970-10-11  Transition of Care [facility]) CM/SW Contact:    Beverly Sessions, RN Phone Number: 10/01/2022, 1:29 PM  Clinical Narrative:                  Transition of Care [facility] Screening Note   Patient Details  Name: Russell Grant Date of Birth: 1971/02/11   Transition of Care [facility]) CM/SW Contact:    Beverly Sessions, RN Phone Number: 10/01/2022, 1:29 PM    Transition of Care Department [facility]) has reviewed patient and no TOC needs have been identified at this time. We will continue to monitor patient advancement through interdisciplinary progression rounds. If new patient transition needs arise, please place a TOC consult.          Patient Goals and CMS Choice            Expected Discharge Plan and Services                                              Prior Living Arrangements/Services                       Activities of Daily Living Home Assistive Devices/Equipment: None ADL Screening (condition at time of admission) Patient's cognitive ability adequate to safely complete daily activities?: Yes Is the patient deaf or have difficulty hearing?: No Does the patient have difficulty seeing, even when wearing glasses/contacts?: No Does the patient have difficulty concentrating, remembering, or making decisions?: No Patient able to express need for assistance with ADLs?: Yes Does the patient have difficulty dressing or bathing?: No Independently performs ADLs?: Yes (appropriate for developmental age) Does the patient have difficulty walking or climbing stairs?: No Weakness of Legs: None Weakness of Arms/Hands: None  Permission Sought/Granted                  Emotional Assessment              Admission diagnosis:  AKI (acute kidney injury) ([place]) [N17.9] Acute renal failure superimposed on stage 4 chronic  kidney disease ([place]) [N17.9, N18.4] Patient Active Problem List   Diagnosis Date Noted   Acute renal failure superimposed on stage 4 chronic kidney disease ([place]) 09/30/2022   Type II diabetes mellitus with renal manifestations ([place]) 09/30/2022   HLD (hyperlipidemia) 09/30/2022   Hypokalemia 09/30/2022   Elevated lipase 09/30/2022   Obesity, Class III, BMI 40-49.9 (morbid obesity) ([place]) 09/30/2022   Gout 09/30/2022   Generalized weakness 08/04/2020   Dehydration 08/04/2020   New onset type 2 diabetes mellitus ([place]) 08/04/2020   SIRS due to infectious process with acute organ dysfunction ([place]) 08/04/2020   FSGS (focal segmental glomerulosclerosis), tip variant with nephrosis    Nephrotic syndrome 05/09/2020   AKI (acute kidney injury) ([place]) 05/07/2020   Anasarca 05/07/2020   CKD (chronic kidney disease) stage 3, GFR 30-59 ml/min ([place]) 03/30/2019   Hyperuricemia 03/30/2019   History of gout 03/30/2019   IFG (impaired fasting glucose) 03/30/2019   Personal history of other malignant neoplasm of skin 09/10/2017   SCC (squamous cell carcinoma), hand 08/24/2017   Class 2 severe obesity due to excess calories with serious comorbidity and body mass index (BMI) of 38.0 to 38.9 in adult [facility])  08/24/2017   Hypertriglyceridemia    Essential hypertension    PCP:  Sofie Hartigan, MD Pharmacy:   [id number] [place], [place] - [id number]: [phone]: [phone] [id number] [place] [id number]: [phone]: [phone]   Social Determinants of Health (SDOH) Social History: [place] Food Insecurity (09/30/2022)  Housing: Low Risk  (09/30/2022)  Transportation Needs: No Transportation Needs (09/30/2022)  Utilities: Not At Risk (09/30/2022)  Tobacco Use: Low Risk  (09/30/2022)   SDOH Interventions:      Readmission Risk Interventions    08/11/2020    2:13 PM 08/06/2020   10:27 AM  Readmission Risk Prevention Plan  Transportation Screening Complete Complete  Palliative Care Screening  Not Applicable  Medication Review (RN Care Manager) Complete Complete  Palliative Care Screening Not Ap[place] Applicable

## 2022-10-01 NOTE — Progress Notes (Signed)
Mobility Specialist - Progress Note   10/01/22 1607  Mobility  Activity Ambulated independently in hallway  Level of Assistance Independent  Assistive Device None (Pushing IV pole)  Distance Ambulated (ft) 640 ft  Activity Response Tolerated well  $Mobility charge 1 Mobility   Pt amb in room upon entry, utilizing RA. Pt ambulated four laps around NS, tolerated well. Pt returned to room, left amb with needs within reach.   Candie Mile Mobility Specialist 10/01/22 4:13 PM

## 2022-10-01 NOTE — Progress Notes (Signed)
PROGRESS NOTE    Russell Grant   ZOX:[id number] DOB: 06/15/1971  DOA: 09/30/2022 Date of Service: 10/01/22 PCP: Sofie Hartigan, MD     Brief Narrative / Hospital Course:  Russell Grant is a 52 y.o. male with past medical history significant for FSGS, CKD stage IV, who presents to the emergency department with elevation of his creatinine.  Patient states that he has been having issues with his creatinine elevation over the past couple of weeks.  States that he was told to come into the emergency department for some fluids and to recheck his glucose.  01/24: Cr 5.15 on 01/23, in ED 01/24 was 4.25, UA no concerns, CT scan of abdomen/pelvis negative for acute intra-abdominal issues, no hydronephrosis. Kept for observation and fluids.  01/25: Cr is 4.03, still not baseline. Spoke w/ nephrology and they recommend keep him 1-2 more days for fluids and monitoring.   Consultants:  Nephrology   Procedures: none      ASSESSMENT & PLAN:   Principal Problem:   Acute renal failure superimposed on stage 4 chronic kidney disease (HCC) Active Problems:   FSGS (focal segmental glomerulosclerosis), tip variant with nephrosis   Essential hypertension   Type II diabetes mellitus with renal manifestations (HCC)   HLD (hyperlipidemia)   Hypokalemia   Elevated lipase   Gout   Obesity, Class III, BMI 40-49.9 (morbid obesity) ([place])  Acute renal failure superimposed on stage 4 chronic kidney disease ([place]):  Possibly due to dehydration and continuation of diuretics, Lotensin and Cozarr.   Consulted [name] of renal hold torsemide, Lotensin and Cozaar IV fluid, 70 cc/h of normal saline per [name] Strict I&O   FSGS (focal segmental glomerulosclerosis), tip variant with nephrosis:  pt just stopped taking cyclosporine 2 days ago f/u with [name]    Essential hypertension IV hydralazine as needed hold Lotensin and Cozaar Hold torsemide Continue Coreg Start amlodipine  temporarily   Type II diabetes mellitus with renal manifestations [facility]):  Recent A1c 7.1, poorly controlled.   Patient is taking [place] and [place] at home SSI   HLD (hyperlipidemia) Patient is not taking  Statin currently Fenofibrate   Hypokalemia - resolved: K 3.4 on admission --> 3.8 will not replete potassium due to worsening renal function Follow BMP and magnesium level   Elevated lipase: Lipase 138.   No abdominal pain.  Pancreas is unremarkable on CT scan.   Clinically does not have pancreatitis.   Gout Continue Febuxostat   Obesity, Class III, BMI 40-49.9 (morbid obesity) ([place]):  Body weight 129 kg, BMI 40.80 Encouraged losing weight Healthy diet and exercise      DVT prophylaxis: heparin Pertinent IV fluids/nutrition: fluids per nephrology Central lines / invasive devices: none  Code Status: FULL CODE   Current Admission Status: observation on MedSurg unit - have asked utilization review re: inpatient status since will need continued IV therapy and close monitoring   TOC needs / Dispo plan: none at this time, anticipate d/c home  Barriers to discharge / significant pending items: AKI, nephrology clearance              Subjective / Brief ROS:  Patient reports feeling okay today, no concerns  Denies CP/SOB.  Pain controlled.  Denies new weakness.  Tolerating diet.  Reports no concerns w/ urination/defecation.   Family Communication: support person at bedside on rounds     Objective Findings:  Vitals:   09/30/22 1834 09/30/22 1940 09/30/22 2110 10/01/22 0455  BP: Marland Kitchen)  164/80 (!) 162/61 (!) 148/59 135/68  Pulse: 85 77  78  Resp: '16 20  18  '$ Temp: 99.4 F (37.4 C) 98.3 F (36.8 C)  98.4 F (36.9 C)  TempSrc: Oral   Oral  SpO2: 99% 99%  100%  Weight: 132.3 kg     Height: '5\' 10"'$  (1.778 m)       Intake/Output Summary (Last 24 hours) at 10/01/2022 0842 Last data filed at 10/01/2022 0455 Gross per 24 hour  Intake 835.36 ml  Output 300 ml   Net 535.36 ml   Filed Weights   09/30/22 1834  Weight: 132.3 kg    Examination:  Physical Exam Constitutional:      General: He is not in acute distress.    Appearance: He is obese.  Cardiovascular:     Rate and Rhythm: Normal rate and regular rhythm.  Pulmonary:     Effort: Pulmonary effort is normal.     Breath sounds: Normal breath sounds.  Abdominal:     General: Bowel sounds are normal.     Palpations: Abdomen is soft.  Musculoskeletal:        General: Normal range of motion.  Skin:    General: Skin is dry.  Neurological:     General: No focal deficit present.     Mental Status: He is alert and oriented to person, place, and time.     Cranial Nerves: No cranial nerve deficit.  Psychiatric:        Mood and Affect: Mood normal.        Behavior: Behavior normal.          Scheduled Medications:   amLODipine  10 mg Oral Daily   aspirin  81 mg Oral Daily   calcium-vitamin D  1 tablet Oral Daily   carvedilol  6.25 mg Oral BID   febuxostat  40 mg Oral Daily   heparin  5,000 Units Subcutaneous Q8H   insulin aspart  0-5 Units Subcutaneous QHS   insulin aspart  0-9 Units Subcutaneous TID WC   multivitamin with minerals  1 tablet Oral Daily   omega-3 acid ethyl esters  2 capsule Oral BID   senna-docusate  1 tablet Oral Daily    Continuous Infusions:  sodium chloride 70 mL/hr at 10/01/22 0455    PRN Medications:  acetaminophen, hydrALAZINE, loratadine, ondansetron (ZOFRAN) IV  Antimicrobials from admission:  Anti-infectives (From admission, onward)    None           Data Reviewed:  I have personally reviewed the following...  CBC: Recent Labs  Lab 09/30/22 1130  WBC 8.0  HGB 11.2*  HCT 34.6*  MCV 90.3  PLT 951   Basic Metabolic Panel: Recent Labs  Lab 09/30/22 1130 10/01/22 0541  NA 137 140  K 3.4* 3.8  CL 103 111  CO2 24 22  GLUCOSE 101* 96  BUN 84* 83*  CREATININE 4.25* 4.03*  CALCIUM 7.9* 7.8*   GFR: Estimated Creatinine  Clearance: 29 mL/min (A) (by C-G formula based on SCr of 4.03 mg/dL (H)). Liver Function Tests: Recent Labs  Lab 09/30/22 1130  AST 21  ALT 19  ALKPHOS 43  BILITOT 0.6  PROT 5.7*  ALBUMIN 2.5*   Recent Labs  Lab 09/30/22 1130 10/01/22 0541  LIPASE 138* 186*   No results for input(s): "AMMONIA" in the last 168 hours. Coagulation Profile: No results for input(s): "INR", "PROTIME" in the last 168 hours. Cardiac Enzymes: No results for input(s): "CKTOTAL", "CKMB", "CKMBINDEX", "TROPONINI"  in the last 168 hours. BNP (last 3 results) No results for input(s): "PROBNP" in the last 8760 hours. HbA1C: No results for input(s): "HGBA1C" in the last 72 hours. CBG: Recent Labs  Lab 09/30/22 2105  GLUCAP 112*   Lipid Profile: No results for input(s): "CHOL", "HDL", "LDLCALC", "TRIG", "CHOLHDL", "LDLDIRECT" in the last 72 hours. Thyroid Function Tests: No results for input(s): "TSH", "T4TOTAL", "FREET4", "T3FREE", "THYROIDAB" in the last 72 hours. Anemia Panel: No results for input(s): "VITAMINB12", "FOLATE", "FERRITIN", "TIBC", "IRON", "RETICCTPCT" in the last 72 hours. Most Recent Urinalysis On File:     Component Value Date/Time   COLORURINE YELLOW (A) 09/30/2022 1130   APPEARANCEUR CLEAR (A) 09/30/2022 1130   APPEARANCEUR Turbid (A) 05/02/2019 0811   LABSPEC 1.028 09/30/2022 1130   PHURINE 5.0 09/30/2022 1130   GLUCOSEU NEGATIVE 09/30/2022 1130   HGBUR NEGATIVE 09/30/2022 1130   BILIRUBINUR NEGATIVE 09/30/2022 1130   BILIRUBINUR Negative 03/06/2020 1347   BILIRUBINUR Negative 05/02/2019 0811   KETONESUR NEGATIVE 09/30/2022 1130   PROTEINUR NEGATIVE 09/30/2022 1130   UROBILINOGEN 0.2 03/06/2020 1347   NITRITE NEGATIVE 09/30/2022 1130   LEUKOCYTESUR NEGATIVE 09/30/2022 1130   Sepsis Labs: '@LABRCNTIP'$ (procalcitonin:4,lacticidven:4) Microbiology: No results found for this or any previous visit (from the past 240 hour(s)).    Radiology Studies last 3 days: CT ABDOMEN  PELVIS WO CONTRAST  Result Date: 09/30/2022 CLINICAL DATA:  Left lower quadrant abdominal pain. Left flank pain and elevated creatinine. EXAM: CT ABDOMEN AND PELVIS WITHOUT CONTRAST TECHNIQUE: Multidetector CT imaging of the abdomen and pelvis was performed following the standard protocol without IV contrast. RADIATION DOSE REDUCTION: This exam was performed according to the departmental dose-optimization program which includes automated exposure control, adjustment of the mA and/or kV according to patient size and/or use of iterative reconstruction technique. COMPARISON:  Abdominal ultrasound 05/30/2020. FINDINGS: Lower chest: Clear lung bases.  No pleural or pericardial effusion. Hepatobiliary: No focal liver abnormality is seen. No gallstones, gallbladder wall thickening, or biliary dilatation. Pancreas: Unremarkable. No pancreatic ductal dilatation or surrounding inflammatory changes. Spleen: Normal in size without focal abnormality. Adrenals/Urinary Tract: Normal adrenal glands. Kidneys are unremarkable. No renal calculi or hydronephrosis. Bladder is unremarkable. Stomach/Bowel: Unremarkable stomach and duodenal. No dilated loops of small bowel. Normal appendix is visualized on coronal image 59 series 5. Decompressed colon. Mild sigmoid diverticulosis without evidence of acute diverticulitis. Normal rectum. Vascular/Lymphatic: No significant vascular findings are present. No enlarged abdominal or pelvic lymph nodes. Reproductive: Prostate is unremarkable. Other: No significant abdominal wall abnormality.  No ascites. Musculoskeletal: Multilevel lumbar spine degeneration. No suspicious bone lesions. IMPRESSION: 1. No acute abnormality in the abdomen or pelvis. 2. Mild sigmoid diverticulosis without evidence of acute diverticulitis. Electronically Signed   By: Emmit Alexanders M.D.   On: 09/30/2022 16:35             LOS: 0 days      Emeterio Reeve, DO [facility] 10/01/2022, 8:42 AM     Dictation software may have been used to generate the above note. Typos may occur and escape review in typed/dictated notes. Please contact Dr Sheppard Coil directly for clarity if needed.  Staff may message me via secure chat in [place]  but this may not receive an immediate response,  please page me for urgent matters!  If 7PM-7AM, please contact night coverage [email or web address]

## 2022-10-01 NOTE — Hospital Course (Addendum)
Russell Grant is a 52 y.o. male with past medical history significant for FSGS, CKD stage IV, who presents to the emergency department with elevation of his creatinine.  Patient states that he has been having issues with his creatinine elevation over the past couple of weeks.  States that he was told to come into the emergency department for some fluids and to recheck his glucose.  01/24: Cr 5.15 on 01/23, in ED 01/24 was 4.25 and GFR 16, UA no concerns, CT scan of abdomen/pelvis negative for acute intra-abdominal issues, no hydronephrosis. Kept for observation and fluids.  01/25: Cr is 4.03 and GFR 17, still not baseline. Spoke w/ nephrology and they recommend keep him 1-2 more days for fluids and monitoring.  01/26: Cr is 3.75, GFR 18.   Consultants:  Nephrology   Procedures: none      ASSESSMENT & PLAN:   Principal Problem:   Acute renal failure superimposed on stage 4 chronic kidney disease (HCC) Active Problems:   FSGS (focal segmental glomerulosclerosis), tip variant with nephrosis   Essential hypertension   Type II diabetes mellitus with renal manifestations (HCC)   HLD (hyperlipidemia)   Hypokalemia   Elevated lipase   Gout   Obesity, Class III, BMI 40-49.9 (morbid obesity) ([place])  Acute renal failure superimposed on stage 4 chronic kidney disease ([facility]):  Possibly due to dehydration and continuation of diuretics, Lotensin and Cozarr.   Nephrology team following - keep inpatient for now  hold torsemide, Lotensin and Cozaar IV fluid per nephrology  Strict I&O   FSGS (focal segmental glomerulosclerosis), tip variant with nephrosis:  pt just stopped taking cyclosporine prior to admission AKI/ARF seems rapid for FSGS per nephrology, more likely d/t dehdration, treating as above  f/u with Dr. Holley Grant    Essential hypertension IV hydralazine as needed hold Lotensin and Cozaar Hold torsemide Continue Coreg Started amlodipine    Type II diabetes mellitus with renal  manifestations [facility]):  Recent A1c 7.1, poorly controlled.   Patient is taking [place] and [place] at home SSI   HLD (hyperlipidemia) Patient is not taking  Statin currently Fenofibrate   Hypokalemia - resolved: K 3.4 on admission --> 3.8 will not replete potassium due to worsening renal function Follow BMP and magnesium level   Elevated lipase: Lipase 138.   No abdominal pain.  Pancreas is unremarkable on CT scan.   Clinically does not have pancreatitis.   Gout Continue Febuxostat   Obesity, Class III, BMI 40-49.9 (morbid obesity) ([place]):  Body weight 129 kg, BMI 40.80 Encouraged losing weight Healthy diet and exercise      DVT prophylaxis: heparin refused by patient, he is ambulating  Pertinent IV fluids/nutrition: fluids per nephrology Central lines / invasive devices: none  Code Status: FULL CODE   Current Admission Status: inpatient, MedSurg Barriers to discharge / significant pending items: AKI, nephrology clearance

## 2022-10-02 DIAGNOSIS — N179 Acute kidney failure, unspecified: Secondary | ICD-10-CM | POA: Diagnosis not present

## 2022-10-02 DIAGNOSIS — E785 Hyperlipidemia, unspecified: Secondary | ICD-10-CM | POA: Diagnosis not present

## 2022-10-02 DIAGNOSIS — E1129 Type 2 diabetes mellitus with other diabetic kidney complication: Secondary | ICD-10-CM | POA: Diagnosis not present

## 2022-10-02 DIAGNOSIS — M1A9XX0 Chronic gout, unspecified, without tophus (tophi): Secondary | ICD-10-CM | POA: Diagnosis not present

## 2022-10-02 LAB — BASIC METABOLIC PANEL
Anion gap: 6 (ref 5–15)
BUN: 80 mg/dL — ABNORMAL HIGH (ref 6–20)
CO2: 23 mmol/L (ref 22–32)
Calcium: 8.2 mg/dL — ABNORMAL LOW (ref 8.9–10.3)
Chloride: 112 mmol/L — ABNORMAL HIGH (ref 98–111)
Creatinine, Ser: 3.75 mg/dL — ABNORMAL HIGH (ref 0.61–1.24)
GFR, Estimated: 18 mL/min — ABNORMAL LOW (ref >60)
Glucose, Bld: 99 mg/dL (ref 70–99)
Potassium: 4 mmol/L (ref 3.5–5.1)
Sodium: 141 mmol/L (ref 135–145)

## 2022-10-02 LAB — GLUCOSE, CAPILLARY
Glucose-Capillary: 101 mg/dL — ABNORMAL HIGH (ref 70–99)
Glucose-Capillary: 123 mg/dL — ABNORMAL HIGH (ref 70–99)
Glucose-Capillary: 104 mg/dL — ABNORMAL HIGH (ref 70–99)
Glucose-Capillary: 84 mg/dL (ref 70–99)

## 2022-10-02 NOTE — Progress Notes (Signed)
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  Patient presents to the emergency department at the advice of his nephrologist due to abnormal labs.  Patient has been admitted for AKI (acute kidney injury) ([place]) [N17.9] Acute renal failure superimposed on stage 4 chronic kidney disease ([place]) [N17.9, N18.4]   Patient is known to our practice and is followed outpatient by [name].  Patient was seen in office and it was noted that patient underwent a significant decrease in renal function, GFR 30-12.    Patient sitting at side of bed, wife at bedside Alert and oriented Reports appropriate appetite without nausea and vomiting Denies chest pain or discomfort Remains on room air No lower extremity edema   Objective:  Vital signs in last 24 hours:  Temp:  [97.5 F (36.4 C)-98.6 F (37 C)] 97.5 F (36.4 C) (01/26 0815) Pulse Rate:  [72-81] 72 (01/26 0815) Resp:  [16-18] 18 (01/26 0815) BP: (121-143)/(46-70) 143/57 (01/26 0815) SpO2:  [97 %-100 %] 100 % (01/26 0815) Weight:  [133.8 kg] 133.8 kg (01/26 0518)  Weight change: 1.486 kg Filed Weights   09/30/22 1834 10/02/22 0518  Weight: 132.3 kg 133.8 kg    Intake/Output: I/O last 3 completed shifts: In: 1750.9 [P.O.:664; I.V.:936.9; IV Piggyback:150] Out: 2800 [Urine:2800]   Intake/Output this shift:  Total I/O In: 120 [P.O.:120] Out: -   Physical Exam: General: NAD  Head: Normocephalic, atraumatic. Moist oral mucosal membranes  Eyes: Anicteric  Lungs:  Clear to auscultation, normal effort, room air  Heart: Regular rate and rhythm  Abdomen:  Soft, nontender  Extremities: No peripheral edema.  Neurologic: Nonfocal, moving all four extremities  Skin: No lesions  Access: None    Basic Metabolic Panel: Recent Labs  Lab 09/30/22 1130  10/01/22 0541 10/02/22 0435  NA 137 140 141  K 3.4* 3.8 4.0  CL 103 111 112*  CO2 '24 22 23  '$ GLUCOSE 101* 96 99  BUN 84* 83* 80*  CREATININE 4.25* 4.03* 3.75*  CALCIUM 7.9* 7.8* 8.2*     Liver Function Tests: Recent Labs  Lab 09/30/22 1130  AST 21  ALT 19  ALKPHOS 43  BILITOT 0.6  PROT 5.7*  ALBUMIN 2.5*    Recent Labs  Lab 09/30/22 1130 10/01/22 0541  LIPASE 138* 186*    No results for input(s): "AMMONIA" in the last 168 hours.  CBC: Recent Labs  Lab 09/30/22 1130  WBC 8.0  HGB 11.2*  HCT 34.6*  MCV 90.3  PLT 243     Cardiac Enzymes: No results for input(s): "CKTOTAL", "CKMB", "CKMBINDEX", "TROPONINI" in the last 168 hours.  BNP: Invalid input(s): "POCBNP"  CBG: Recent Labs  Lab 10/01/22 1233 10/01/22 1645 10/01/22 2139 10/02/22 0823 10/02/22 1204  GLUCAP 125* 103* 152* 101* 123*     Microbiology: Results for orders placed or performed during the hospital encounter of 08/04/20  Resp Panel by RT-PCR (Flu A&B, Covid) Nasopharyngeal Swab     Status: None   Collection Time: 08/04/20  5:07 AM   Specimen: Nasopharyngeal Swab; Nasopharyngeal(NP) swabs in vial transport medium  Result Value Ref Range Status   SARS Coronavirus 2 by RT PCR NEGATIVE NEGATIVE Final    Comment: (NOTE) SARS-CoV-2 target nucleic acids are NOT DETECTED.  The SARS-CoV-2 RNA is generally detectable in upper respiratory specimens during the acute phase of infection.  The lowest concentration of SARS-CoV-2 viral copies this assay can detect is 138 copies/mL. A negative result does not preclude SARS-Cov-2 infection and should not be used as the sole basis for treatment or other patient management decisions. A negative result may occur with  improper specimen collection/handling, submission of specimen other than nasopharyngeal swab, presence of viral mutation(s) within the areas targeted by this assay, and inadequate number of viral copies(<138 copies/mL). A negative  result must be combined with clinical observations, patient history, and epidemiological information. The expected result is Negative.  Fact Sheet for Patients:  EntrepreneurPulse.com.au  Fact Sheet for Healthcare Providers:  IncredibleEmployment.be  This test is no t yet approved or cleared by the [place] FDA and  has been authorized for detection and/or diagnosis of SARS-CoV-2 by FDA under an Emergency Use Authorization (EUA). This EUA will remain  in effect (meaning this test can be used) for the duration of the COVID-19 declaration under Section 564(b)(1) of the Act, 21 U.S.C.section 360bbb-3(b)(1), unless the authorization is terminated  or revoked sooner.       Influenza A by PCR NEGATIVE NEGATIVE Final   Influenza B by PCR NEGATIVE NEGATIVE Final    Comment: (NOTE) The Xpert Xpress SARS-CoV-2/FLU/RSV plus assay is intended as an aid in the diagnosis of influenza from Nasopharyngeal swab specimens and should not be used as a sole basis for treatment. Nasal washings and aspirates are unacceptable for Xpert Xpress SARS-CoV-2/FLU/RSV testing.  Fact Sheet for Patients: EntrepreneurPulse.com.au  Fact Sheet for Healthcare Providers: IncredibleEmployment.be  This test is not yet approved or cleared by the [place] FDA and has been authorized for detection and/or diagnosis of SARS-CoV-2 by FDA under an Emergency Use Authorization (EUA). This EUA will remain in effect (meaning this test can be used) for the duration of the COVID-19 declaration under Section 564(b)(1) of the Act, 21 U.S.C. section 360bbb-3(b)(1), unless the authorization is terminated or revoked.  Performed at [facility], [address] [place], [place] [address]   Culture, blood (routine x 2)     Status: None   Collection Time: 08/04/20  7:40 AM   Specimen: BLOOD  Result Value Ref Range Status   Specimen  Description   Final    BLOOD LEFT ANTECUBITAL Performed at [facility], [address] [place], [place] [address]    Special Requests   Final    BOTTLES DRAWN AEROBIC AND ANAEROBIC Blood Culture results may not be optimal due to an excessive volume of blood received in culture bottles Performed at [facility], [address], [place], [place] [address]    Culture   Final    NO GROWTH 5 DAYS Performed at [facility], [place] [address] [place], [place] [address]    Report Status 08/09/2020 FINAL  Final  Culture, blood (routine x 2)     Status: None   Collection Time: 08/04/20  2:40 PM   Specimen: BLOOD  Result Value Ref Range Status   Specimen Description BLOOD RIGHT ANTECUBITAL  Final   Special Requests   Final    BOTTLES DRAWN AEROBIC AND ANAEROBIC Blood Culture results may not be optimal due to an inadequate volume of blood received in culture bottles   Culture   Final    NO GROWTH 5 DAYS Performed at [facility], [address] [place], [place] [address]    Report Status 08/09/2020 FINAL  Final    Coagulation Studies: No results for input(s): "LABPROT", "INR" in  the last 72 hours.  Urinalysis: Recent Labs    09/30/22 1130  COLORURINE YELLOW*  LABSPEC 1.028  PHURINE 5.0  GLUCOSEU NEGATIVE  HGBUR NEGATIVE  BILIRUBINUR NEGATIVE  KETONESUR NEGATIVE  PROTEINUR NEGATIVE  NITRITE NEGATIVE  LEUKOCYTESUR NEGATIVE       Imaging: CT ABDOMEN PELVIS WO CONTRAST  Result Date: 09/30/2022 CLINICAL DATA:  Left lower quadrant abdominal pain. Left flank pain and elevated creatinine. EXAM: CT ABDOMEN AND PELVIS WITHOUT CONTRAST TECHNIQUE: Multidetector CT imaging of the abdomen and pelvis was performed following the standard protocol without IV contrast. RADIATION DOSE REDUCTION: This exam was performed according to the departmental dose-optimization program which includes automated exposure control, adjustment of the mA and/or kV according to  patient size and/or use of iterative reconstruction technique. COMPARISON:  Abdominal ultrasound 05/30/2020. FINDINGS: Lower chest: Clear lung bases.  No pleural or pericardial effusion. Hepatobiliary: No focal liver abnormality is seen. No gallstones, gallbladder wall thickening, or biliary dilatation. Pancreas: Unremarkable. No pancreatic ductal dilatation or surrounding inflammatory changes. Spleen: Normal in size without focal abnormality. Adrenals/Urinary Tract: Normal adrenal glands. Kidneys are unremarkable. No renal calculi or hydronephrosis. Bladder is unremarkable. Stomach/Bowel: Unremarkable stomach and duodenal. No dilated loops of small bowel. Normal appendix is visualized on coronal image 59 series 5. Decompressed colon. Mild sigmoid diverticulosis without evidence of acute diverticulitis. Normal rectum. Vascular/Lymphatic: No significant vascular findings are present. No enlarged abdominal or pelvic lymph nodes. Reproductive: Prostate is unremarkable. Other: No significant abdominal wall abnormality.  No ascites. Musculoskeletal: Multilevel lumbar spine degeneration. No suspicious bone lesions. IMPRESSION: 1. No acute abnormality in the abdomen or pelvis. 2. Mild sigmoid diverticulosis without evidence of acute diverticulitis. Electronically Signed   By: Emmit Alexanders M.D.   On: 09/30/2022 16:35     Medications:    sodium chloride Stopped (10/01/22 1528)   albumin human 25 g (10/02/22 0900)    amLODipine  10 mg Oral Daily   aspirin  81 mg Oral Daily   calcium-vitamin D  1 tablet Oral Daily   carvedilol  6.25 mg Oral BID   febuxostat  40 mg Oral Daily   heparin  5,000 Units Subcutaneous Q8H   insulin aspart  0-5 Units Subcutaneous QHS   insulin aspart  0-9 Units Subcutaneous TID WC   multivitamin with minerals  1 tablet Oral Daily   omega-3 acid ethyl esters  2 capsule Oral BID   senna-docusate  1 tablet Oral Daily   acetaminophen, hydrALAZINE, loratadine, ondansetron (ZOFRAN)  IV  Assessment/ Plan:  Russell Grant is a 52 y.o.  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  Patient presents to the emergency department at the advice of his nephrologist due to abnormal labs.  Patient has been admitted for AKI (acute kidney injury) ([place]) [N17.9] Acute renal failure superimposed on stage 4 chronic kidney disease ([place]) [N17.9, N18.4]   Acute Kidney Injury on chronic kidney disease stage IV secondary to FSGS.  Baseline creatinine 3.03 and GFR of 24 on 07/23/2022.  Acute kidney injury secondary to aggressive diuresis versus progression of FSGS.  Patient prescribed torsemide.100 mg twice daily outpatient, currently held.  GFR noted to be 30 during recent office visit, however now shows to be 12.  Deterioration appears rapid for FSGS.  Likely more hypovolemia.    Renal function improving with IV fluids.  Continue to hold diuresis now and at discharge.  Continue IV albumin twice daily for now.  Will defer GFR 20 or greater before considering discharge.  Lab Results  Component Value Date   CREATININE 3.75 (H) 10/02/2022   CREATININE 4.03 (H) 10/01/2022   CREATININE 4.25 (H) 09/30/2022    Intake/Output Summary (Last 24 hours) at 10/02/2022 1325 Last data filed at 10/02/2022 1035 Gross per 24 hour  Intake 795.52 ml  Output 2500 ml  Net -1704.48 ml    2. Anemia of chronic kidney disease  Lab Results  Component Value Date   HGB 11.2 (L) 09/30/2022   Hemoglobin stable.  3.  Hypertension with chronic kidney disease.  Currently receiving amlodipine and carvedilol.  Blood pressure currently 143/57.  4. Diabetes mellitus type II with chronic kidney disease/renal manifestations: noninsulin dependent. Home regimen includes farxiga. Most recent hemoglobin A1c is 7.1 on 08/06/22.   Glucose well-controlled.    LOS: 1 Thedora Rings 1/26/20241:25 PM

## 2022-10-02 NOTE — Progress Notes (Signed)
PROGRESS NOTE    Russell Grant   NOM:[id number] DOB: 02-Oct-1970  DOA: 09/30/2022 Date of Service: 10/02/22 PCP: Sofie Hartigan, MD     Brief Narrative / Hospital Course:  Russell Grant is a 52 y.o. male with past medical history significant for FSGS, CKD stage IV, who presents to the emergency department with elevation of his creatinine.  Patient states that he has been having issues with his creatinine elevation over the past couple of weeks.  States that he was told to come into the emergency department for some fluids and to recheck his glucose.  01/24: Cr 5.15 on 01/23, in ED 01/24 was 4.25 and GFR 16, UA no concerns, CT scan of abdomen/pelvis negative for acute intra-abdominal issues, no hydronephrosis. Kept for observation and fluids.  01/25: Cr is 4.03 and GFR 17, still not baseline. Spoke w/ nephrology and they recommend keep him 1-2 more days for fluids and monitoring.  01/26: Cr is 3.75, GFR 18.   Consultants:  Nephrology   Procedures: none      ASSESSMENT & PLAN:   Principal Problem:   Acute renal failure superimposed on stage 4 chronic kidney disease (HCC) Active Problems:   FSGS (focal segmental glomerulosclerosis), tip variant with nephrosis   Essential hypertension   Type II diabetes mellitus with renal manifestations (HCC)   HLD (hyperlipidemia)   Hypokalemia   Elevated lipase   Gout   Obesity, Class III, BMI 40-49.9 (morbid obesity) (Russell Grant)  Acute renal failure superimposed on stage 4 chronic kidney disease (Russell Grant):  Possibly due to dehydration and continuation of diuretics, Lotensin and Cozarr.   Nephrology team following - keep inpatient for now  hold torsemide, Lotensin and Cozaar IV fluid per nephrology  Strict I&O   FSGS (focal segmental glomerulosclerosis), tip variant with nephrosis:  pt just stopped taking cyclosporine prior to admission AKI/ARF seems rapid for FSGS per nephrology, more likely d/t dehdration, treating as above  f/u  with [name]    Essential hypertension IV hydralazine as needed hold Lotensin and Cozaar Hold torsemide Continue Coreg Started amlodipine    Type II diabetes mellitus with renal manifestations [facility]):  Recent A1c 7.1, poorly controlled.   Patient is taking [place] and [place] at home SSI   HLD (hyperlipidemia) Patient is not taking  Statin currently Fenofibrate   Hypokalemia - resolved: K 3.4 on admission --> 3.8 will not replete potassium due to worsening renal function Follow BMP and magnesium level   Elevated lipase: Lipase 138.   No abdominal pain.  Pancreas is unremarkable on CT scan.   Clinically does not have pancreatitis.   Gout Continue Febuxostat   Obesity, Class III, BMI 40-49.9 (morbid obesity) (Russell Grant):  Body weight 129 kg, BMI 40.80 Encouraged losing weight Healthy diet and exercise      DVT prophylaxis: heparin refused by patient, he is ambulating  Pertinent IV fluids/nutrition: fluids per nephrology Central lines / invasive devices: none  Code Status: FULL CODE   Current Admission Status: inpatient, MedSurg Barriers to discharge / significant pending items: AKI, nephrology clearance              Subjective / Brief ROS:  Patient reports feeling okay today, no concerns  Denies CP/SOB.  Ambulating the halls [employer].  Reports no concerns w/ urination/defecation.   Family Communication: support person at bedside on rounds     Objective Findings:  Vitals:   10/01/22 1741 10/01/22 2134 10/02/22 0518 10/02/22 0815  BP: 132/70 135/63 (!) 121/46 Marland Kitchen)  143/57  Pulse: 74 81 74 72  Resp: '16 18 16 18  '$ Temp: 98.6 F (37 C) 98.4 F (36.9 C) 97.8 F (36.6 C) (!) 97.5 F (36.4 C)  TempSrc:  Oral Oral Oral  SpO2: 100% 100% 97% 100%  Weight:   133.8 kg   Height:        Intake/Output Summary (Last 24 hours) at 10/02/2022 1418 Last data filed at 10/02/2022 1035 Gross per 24 hour  Intake 795.52 ml  Output 2300 ml  Net -1504.48 ml    Filed Weights   09/30/22 1834 10/02/22 0518  Weight: 132.3 kg 133.8 kg    Examination:  Physical Exam Constitutional:      General: He is not in acute distress.    Appearance: He is obese.  Cardiovascular:     Rate and Rhythm: Normal rate and regular rhythm.  Pulmonary:     Effort: Pulmonary effort is normal.     Breath sounds: Normal breath sounds.  Abdominal:     General: Bowel sounds are normal.     Palpations: Abdomen is soft.  Musculoskeletal:        General: Normal range of motion.  Skin:    General: Skin is dry.  Neurological:     General: No focal deficit present.     Mental Status: He is alert and oriented to person, place, and time.     Cranial Nerves: No cranial nerve deficit.  Psychiatric:        Mood and Affect: Mood normal.        Behavior: Behavior normal.          Scheduled Medications:   amLODipine  10 mg Oral Daily   aspirin  81 mg Oral Daily   calcium-vitamin D  1 tablet Oral Daily   carvedilol  6.25 mg Oral BID   febuxostat  40 mg Oral Daily   insulin aspart  0-5 Units Subcutaneous QHS   insulin aspart  0-9 Units Subcutaneous TID WC   multivitamin with minerals  1 tablet Oral Daily   omega-3 acid ethyl esters  2 capsule Oral BID   senna-docusate  1 tablet Oral Daily    Continuous Infusions:  sodium chloride Stopped (10/01/22 1528)   albumin human 25 g (10/02/22 0900)    PRN Medications:  acetaminophen, hydrALAZINE, loratadine, ondansetron (ZOFRAN) IV  Antimicrobials from admission:  Anti-infectives (From admission, onward)    None           Data Reviewed:  I have personally reviewed the following...  CBC: Recent Labs  Lab 09/30/22 1130  WBC 8.0  HGB 11.2*  HCT 34.6*  MCV 90.3  PLT 270   Basic Metabolic Panel: Recent Labs  Lab 09/30/22 1130 10/01/22 0541 10/02/22 0435  NA 137 140 141  K 3.4* 3.8 4.0  CL 103 111 112*  CO2 '24 22 23  '$ GLUCOSE 101* 96 99  BUN 84* 83* 80*  CREATININE 4.25* 4.03* 3.75*   CALCIUM 7.9* 7.8* 8.2*   GFR: Estimated Creatinine Clearance: 31.4 mL/min (A) (by C-G formula based on SCr of 3.75 mg/dL (H)). Liver Function Tests: Recent Labs  Lab 09/30/22 1130  AST 21  ALT 19  ALKPHOS 43  BILITOT 0.6  PROT 5.7*  ALBUMIN 2.5*   Recent Labs  Lab 09/30/22 1130 10/01/22 0541  LIPASE 138* 186*   No results for input(s): "AMMONIA" in the last 168 hours. Coagulation Profile: No results for input(s): "INR", "PROTIME" in the last 168 hours. Cardiac Enzymes:  No results for input(s): "CKTOTAL", "CKMB", "CKMBINDEX", "TROPONINI" in the last 168 hours. BNP (last 3 results) No results for input(s): "PROBNP" in the last 8760 hours. HbA1C: No results for input(s): "HGBA1C" in the last 72 hours. CBG: Recent Labs  Lab 10/01/22 1233 10/01/22 1645 10/01/22 2139 10/02/22 0823 10/02/22 1204  GLUCAP 125* 103* 152* 101* 123*   Lipid Profile: No results for input(s): "CHOL", "HDL", "LDLCALC", "TRIG", "CHOLHDL", "LDLDIRECT" in the last 72 hours. Thyroid Function Tests: No results for input(s): "TSH", "T4TOTAL", "FREET4", "T3FREE", "THYROIDAB" in the last 72 hours. Anemia Panel: No results for input(s): "VITAMINB12", "FOLATE", "FERRITIN", "TIBC", "IRON", "RETICCTPCT" in the last 72 hours. Most Recent Urinalysis On File:     Component Value Date/Time   COLORURINE YELLOW (A) 09/30/2022 1130   APPEARANCEUR CLEAR (A) 09/30/2022 1130   APPEARANCEUR Turbid (A) 05/02/2019 0811   LABSPEC 1.028 09/30/2022 1130   PHURINE 5.0 09/30/2022 1130   GLUCOSEU NEGATIVE 09/30/2022 1130   HGBUR NEGATIVE 09/30/2022 1130   BILIRUBINUR NEGATIVE 09/30/2022 1130   BILIRUBINUR Negative 03/06/2020 1347   BILIRUBINUR Negative 05/02/2019 0811   KETONESUR NEGATIVE 09/30/2022 1130   PROTEINUR NEGATIVE 09/30/2022 1130   UROBILINOGEN 0.2 03/06/2020 1347   NITRITE NEGATIVE 09/30/2022 1130   LEUKOCYTESUR NEGATIVE 09/30/2022 1130   Sepsis  Labs: '@LABRCNTIP'$ (procalcitonin:4,lacticidven:4) Microbiology: No results found for this or any previous visit (from the past 240 hour(s)).    Radiology Studies last 3 days: CT ABDOMEN PELVIS WO CONTRAST  Result Date: 09/30/2022 CLINICAL DATA:  Left lower quadrant abdominal pain. Left flank pain and elevated creatinine. EXAM: CT ABDOMEN AND PELVIS WITHOUT CONTRAST TECHNIQUE: Multidetector CT imaging of the abdomen and pelvis was performed following the standard protocol without IV contrast. RADIATION DOSE REDUCTION: This exam was performed according to the departmental dose-optimization program which includes automated exposure control, adjustment of the mA and/or kV according to patient size and/or use of iterative reconstruction technique. COMPARISON:  Abdominal ultrasound 05/30/2020. FINDINGS: Lower chest: Clear lung bases.  No pleural or pericardial effusion. Hepatobiliary: No focal liver abnormality is seen. No gallstones, gallbladder wall thickening, or biliary dilatation. Pancreas: Unremarkable. No pancreatic ductal dilatation or surrounding inflammatory changes. Spleen: Normal in size without focal abnormality. Adrenals/Urinary Tract: Normal adrenal glands. Kidneys are unremarkable. No renal calculi or hydronephrosis. Bladder is unremarkable. Stomach/Bowel: Unremarkable stomach and duodenal. No dilated loops of small bowel. Normal appendix is visualized on coronal image 59 series 5. Decompressed colon. Mild sigmoid diverticulosis without evidence of acute diverticulitis. Normal rectum. Vascular/Lymphatic: No significant vascular findings are present. No enlarged abdominal or pelvic lymph nodes. Reproductive: Prostate is unremarkable. Other: No significant abdominal wall abnormality.  No ascites. Musculoskeletal: Multilevel lumbar spine degeneration. No suspicious bone lesions. IMPRESSION: 1. No acute abnormality in the abdomen or pelvis. 2. Mild sigmoid diverticulosis without evidence of acute  diverticulitis. Electronically Signed   By: Emmit Alexanders M.D.   On: 09/30/2022 16:35             LOS: 1 day      Emeterio Reeve, DO [facility] 10/02/2022, 2:18 PM    Dictation software may have been used to generate the above note. Typos may occur and escape review in typed/dictated notes. Please contact Dr Sheppard Coil directly for clarity if needed.  Staff may message me via secure chat in [place]  but this may not receive an immediate response,  please page me for urgent matters!  If 7PM-7AM, please contact night coverage [email or web address]

## 2022-10-02 NOTE — Progress Notes (Signed)
Mobility Specialist - Progress Note   10/02/22 0955  Mobility  Activity Ambulated independently in hallway  Level of Assistance Independent  Assistive Device None  Distance Ambulated (ft) 800 ft  Activity Response Tolerated well  $Mobility charge 1 Mobility   Pt amb five laps in the hallway indep, tolerated well. Pt returned to room, left amb with needs within reach.   Candie Mile Mobility Specialist 10/02/22 9:57 AM

## 2022-10-03 DIAGNOSIS — M1A9XX0 Chronic gout, unspecified, without tophus (tophi): Secondary | ICD-10-CM | POA: Diagnosis not present

## 2022-10-03 DIAGNOSIS — N179 Acute kidney failure, unspecified: Secondary | ICD-10-CM | POA: Diagnosis not present

## 2022-10-03 DIAGNOSIS — E1129 Type 2 diabetes mellitus with other diabetic kidney complication: Secondary | ICD-10-CM | POA: Diagnosis not present

## 2022-10-03 DIAGNOSIS — E785 Hyperlipidemia, unspecified: Secondary | ICD-10-CM | POA: Diagnosis not present

## 2022-10-03 LAB — BASIC METABOLIC PANEL
Anion gap: 9 (ref 5–15)
BUN: 65 mg/dL — ABNORMAL HIGH (ref 6–20)
CO2: 21 mmol/L — ABNORMAL LOW (ref 22–32)
Calcium: 8.4 mg/dL — ABNORMAL LOW (ref 8.9–10.3)
Chloride: 111 mmol/L (ref 98–111)
Creatinine, Ser: 3.32 mg/dL — ABNORMAL HIGH (ref 0.61–1.24)
GFR, Estimated: 21 mL/min — ABNORMAL LOW (ref >60)
Glucose, Bld: 119 mg/dL — ABNORMAL HIGH (ref 70–99)
Potassium: 4.1 mmol/L (ref 3.5–5.1)
Sodium: 141 mmol/L (ref 135–145)

## 2022-10-03 LAB — GLUCOSE, CAPILLARY: Glucose-Capillary: 108 mg/dL — ABNORMAL HIGH (ref 70–99)

## 2022-10-03 MED ORDER — ROSUVASTATIN CALCIUM 20 MG PO TABS: 20.0000 mg | ORAL_TABLET | Freq: Every day | ORAL | 0 refills | Status: AC

## 2022-10-03 MED ORDER — ALUM & MAG HYDROXIDE-SIMETH 200-200-20 MG/5ML PO SUSP
30.0000 mL | ORAL | Status: DC | PRN
  Administered 2022-10-03: 30 mL via ORAL
  Filled 2022-10-03: qty 30

## 2022-10-03 MED ORDER — AMLODIPINE BESYLATE 10 MG PO TABS: 10.0000 mg | ORAL_TABLET | Freq: Every day | ORAL | 0 refills | Status: AC

## 2022-10-03 NOTE — Plan of Care (Signed)

## 2022-10-03 NOTE — Progress Notes (Signed)
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  Patient presents to the emergency department at the advice of his nephrologist due to abnormal labs.  Patient has been admitted for AKI (acute kidney injury) ([place]) [N17.9] Acute renal failure superimposed on stage 4 chronic kidney disease ([place]) [N17.9, N18.4]   Patient is known to our practice and is followed outpatient by [name].  Patient was seen in office and it was noted that patient underwent a significant decrease in renal function, GFR 30-12.    Patient resting quietly in bed Wife at bedside Tolerating meals without nausea or vomiting No lower extremity edema noted Remains on room air Complains of right arm pain   Objective:  Vital signs in last 24 hours:  Temp:  [98 F (36.7 C)-99 F (37.2 C)] 99 F (37.2 C) (01/27 0848) Pulse Rate:  [70-80] 74 (01/27 0848) Resp:  [14-18] 18 (01/27 0848) BP: (121-147)/(43-60) 147/56 (01/27 0848) SpO2:  [93 %-100 %] 93 % (01/27 0848)  Weight change:  Filed Weights   09/30/22 1834 10/02/22 0518  Weight: 132.3 kg 133.8 kg    Intake/Output: I/O last 3 completed shifts: In: 2255.8 [P.O.:600; I.V.:1305.8; IV Piggyback:350] Out: 3975 [Urine:3975]   Intake/Output this shift:  Total I/O In: 240 [P.O.:240] Out: 600 [Urine:600]  Physical Exam: General: NAD  Head: Normocephalic, atraumatic. Moist oral mucosal membranes  Eyes: Anicteric  Lungs:  Clear to auscultation, normal effort, room air  Heart: Regular rate and rhythm  Abdomen:  Soft, nontender  Extremities: No peripheral edema.  Neurologic: Nonfocal, moving all four extremities  Skin: No lesions  Access: None    Basic Metabolic Panel: Recent Labs  Lab 09/30/22 1130 10/01/22 0541 10/02/22 0435 10/03/22 0808  NA 137 140 141 141  K 3.4* 3.8  4.0 4.1  CL 103 111 112* 111  CO2 '24 22 23 '$ 21*  GLUCOSE 101* 96 99 119*  BUN 84* 83* 80* 65*  CREATININE 4.25* 4.03* 3.75* 3.32*  CALCIUM 7.9* 7.8* 8.2* 8.4*     Liver Function Tests: Recent Labs  Lab 09/30/22 1130  AST 21  ALT 19  ALKPHOS 43  BILITOT 0.6  PROT 5.7*  ALBUMIN 2.5*    Recent Labs  Lab 09/30/22 1130 10/01/22 0541  LIPASE 138* 186*    No results for input(s): "AMMONIA" in the last 168 hours.  CBC: Recent Labs  Lab 09/30/22 1130  WBC 8.0  HGB 11.2*  HCT 34.6*  MCV 90.3  PLT 243     Cardiac Enzymes: No results for input(s): "CKTOTAL", "CKMB", "CKMBINDEX", "TROPONINI" in the last 168 hours.  BNP: Invalid input(s): "POCBNP"  CBG: Recent Labs  Lab 10/02/22 0823 10/02/22 1204 10/02/22 1700 10/02/22 2122 10/03/22 0845  GLUCAP 101* 123* 104* 84 108*     Microbiology: Results for orders placed or performed during the hospital encounter of 08/04/20  Resp Panel by RT-PCR (Flu A&B, Covid) Nasopharyngeal Swab     Status: None   Collection Time: 08/04/20  5:07 AM   Specimen: Nasopharyngeal Swab; Nasopharyngeal(NP) swabs in vial transport medium  Result Value Ref Range Status   SARS Coronavirus 2 by RT PCR NEGATIVE NEGATIVE Final    Comment: (NOTE) SARS-CoV-2 target nucleic acids are NOT DETECTED.  The SARS-CoV-2 RNA is generally detectable in upper respiratory specimens during the acute phase of infection. The lowest concentration  of SARS-CoV-2 viral copies this assay can detect is 138 copies/mL. A negative result does not preclude SARS-Cov-2 infection and should not be used as the sole basis for treatment or other patient management decisions. A negative result may occur with  improper specimen collection/handling, submission of specimen other than nasopharyngeal swab, presence of viral mutation(s) within the areas targeted by this assay, and inadequate number of viral copies(<138 copies/mL). A negative result must be combined  with clinical observations, patient history, and epidemiological information. The expected result is Negative.  Fact Sheet for Patients:  EntrepreneurPulse.com.au  Fact Sheet for Healthcare Providers:  IncredibleEmployment.be  This test is no t yet approved or cleared by the [place] FDA and  has been authorized for detection and/or diagnosis of SARS-CoV-2 by FDA under an Emergency Use Authorization (EUA). This EUA will remain  in effect (meaning this test can be used) for the duration of the COVID-19 declaration under Section 564(b)(1) of the Act, 21 U.S.C.section 360bbb-3(b)(1), unless the authorization is terminated  or revoked sooner.       Influenza A by PCR NEGATIVE NEGATIVE Final   Influenza B by PCR NEGATIVE NEGATIVE Final    Comment: (NOTE) The Xpert Xpress SARS-CoV-2/FLU/RSV plus assay is intended as an aid in the diagnosis of influenza from Nasopharyngeal swab specimens and should not be used as a sole basis for treatment. Nasal washings and aspirates are unacceptable for Xpert Xpress SARS-CoV-2/FLU/RSV testing.  Fact Sheet for Patients: EntrepreneurPulse.com.au  Fact Sheet for Healthcare Providers: IncredibleEmployment.be  This test is not yet approved or cleared by the [place] FDA and has been authorized for detection and/or diagnosis of SARS-CoV-2 by FDA under an Emergency Use Authorization (EUA). This EUA will remain in effect (meaning this test can be used) for the duration of the COVID-19 declaration under Section 564(b)(1) of the Act, 21 U.S.C. section 360bbb-3(b)(1), unless the authorization is terminated or revoked.  Performed at [facility], [address] [place], [place] [address]   Culture, blood (routine x 2)     Status: None   Collection Time: 08/04/20  7:40 AM   Specimen: BLOOD  Result Value Ref Range Status   Specimen Description   Final    BLOOD  LEFT ANTECUBITAL Performed at [facility], [address] [place], [place] [address]    Special Requests   Final    BOTTLES DRAWN AEROBIC AND ANAEROBIC Blood Culture results may not be optimal due to an excessive volume of blood received in culture bottles Performed at [facility], [address] [place], [place] [address]    Culture   Final    NO GROWTH 5 DAYS Performed at [facility], [place] [address] [place], [place] [address]    Report Status 08/09/2020 FINAL  Final  Culture, blood (routine x 2)     Status: None   Collection Time: 08/04/20  2:40 PM   Specimen: BLOOD  Result Value Ref Range Status   Specimen Description BLOOD RIGHT ANTECUBITAL  Final   Special Requests   Final    BOTTLES DRAWN AEROBIC AND ANAEROBIC Blood Culture results may not be optimal due to an inadequate volume of blood received in culture bottles   Culture   Final    NO GROWTH 5 DAYS Performed at [facility], [address], [place], [place] [address]    Report Status 08/09/2020 FINAL  Final    Coagulation Studies: No results for input(s): "LABPROT", "INR" in the last 72  hours.  Urinalysis: Recent Labs    09/30/22 1130  COLORURINE YELLOW*  LABSPEC 1.028  PHURINE 5.0  GLUCOSEU NEGATIVE  HGBUR NEGATIVE  BILIRUBINUR NEGATIVE  KETONESUR NEGATIVE  PROTEINUR NEGATIVE  NITRITE NEGATIVE  LEUKOCYTESUR NEGATIVE       Imaging: No results found.   Medications:    sodium chloride 75 mL/hr at 10/03/22 1004   albumin human 25 g (10/03/22 1000)    amLODipine  10 mg Oral Daily   aspirin  81 mg Oral Daily   calcium-vitamin D  1 tablet Oral Daily   carvedilol  6.25 mg Oral BID   febuxostat  40 mg Oral Daily   insulin aspart  0-5 Units Subcutaneous QHS   insulin aspart  0-9 Units Subcutaneous TID WC   multivitamin with minerals  1 tablet Oral Daily   omega-3 acid ethyl esters  2 capsule Oral BID   senna-docusate  1 tablet Oral Daily   acetaminophen, alum  & mag hydroxide-simeth, hydrALAZINE, loratadine, ondansetron (ZOFRAN) IV  Assessment/ Plan:  Russell Grant is a 52 y.o.  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  Patient presents to the emergency department at the advice of his nephrologist due to abnormal labs.  Patient has been admitted for AKI (acute kidney injury) ([place]) [N17.9] Acute renal failure superimposed on stage 4 chronic kidney disease ([place]) [N17.9, N18.4]   Acute Kidney Injury on chronic kidney disease stage IV secondary to FSGS.  Baseline creatinine 3.03 and GFR of 24 on 07/23/2022.  Acute kidney injury secondary to aggressive diuresis versus progression of FSGS.  Deterioration appears rapid for FSGS.  Likely more hypovolemia due to aggressive outpatient diuretics..    Creatinine continues to improve with IV fluids.  Patient cleared to discharge from renal stance.  Recommending holding torsemide and metolazone at discharge.  Will schedule patient follow-up appointment in our office later this week.  Lab Results  Component Value Date   CREATININE 3.32 (H) 10/03/2022   CREATININE 3.75 (H) 10/02/2022   CREATININE 4.03 (H) 10/01/2022    Intake/Output Summary (Last 24 hours) at 10/03/2022 1123 Last data filed at 10/03/2022 1004 Gross per 24 hour  Intake 1940.25 ml  Output 2575 ml  Net -634.75 ml    2. Anemia of chronic kidney disease  Lab Results  Component Value Date   HGB 11.2 (L) 09/30/2022   Hemoglobin within desired range.  3.  Hypertension with chronic kidney disease.  Currently receiving amlodipine and carvedilol.  Blood pressure stable for this patient.  4. Diabetes mellitus type II with chronic kidney disease/renal manifestations: noninsulin dependent. Home regimen includes farxiga. Most recent hemoglobin A1c is 7.1 on 08/06/22.   Primary team to manage sliding scale insulin.    LOS: 2 Xayvion Shirah 1/27/202411:23 AM

## 2022-10-03 NOTE — Discharge Summary (Signed)
Physician Discharge Summary   Patient: Russell Grant MRN: [id number]  DOB: 08-21-1971   Admit:     Date of Admission: 09/30/2022 Admitted from: home   Discharge: Date of discharge: 10/03/22 Disposition: Home Condition at discharge: good  CODE STATUS: FULL CODE     Discharge Physician: Emeterio Reeve, DO [facility]     PCP: Sofie Hartigan, MD  Recommendations for Outpatient Follow-up:  Follow up with PCP Sofie Hartigan, MD in 2-4 weeks Follow up with nephrology ASAP (their office should call) Please obtain labs/tests: BMP in few days Please follow up on the following pending results: none   Discharge Instructions     Diet - low sodium heart healthy   Complete by: As directed    Increase activity slowly   Complete by: As directed          Discharge Diagnoses: Principal Problem:   Acute renal failure superimposed on stage 4 chronic kidney disease (Russell Grant) Active Problems:   FSGS (focal segmental glomerulosclerosis), tip variant with nephrosis   Essential hypertension   Type II diabetes mellitus with renal manifestations (Russell Grant)   HLD (hyperlipidemia)   Hypokalemia   Elevated lipase   Gout   Obesity, Class III, BMI 40-49.9 (morbid obesity) (Russell Grant)   AKI (acute kidney injury) [facility])       Hospital Course: Russell Grant is a 52 y.o. male with past medical history significant for FSGS, CKD stage IV, who presents to the emergency department with elevation of his creatinine.  Patient states that he has been having issues with his creatinine elevation over the past couple of weeks.  States that he was told to come into the emergency department for some fluids and to recheck his glucose.  01/24: Cr 5.15 on 01/23, in ED 01/24 was 4.25 and GFR 16, UA no concerns, CT scan of abdomen/pelvis negative for acute intra-abdominal issues, no hydronephrosis. Kept for observation and fluids.  01/25: Cr is 4.03 and GFR 17, still not baseline. Spoke w/  nephrology and they recommend keep him 1-2 more days for fluids and monitoring.  01/26: Cr is 3.75, GFR 18.  01/27: Cr 3.32, nephrology has cleared for d/c home w/ close outpatient f/u and keep off nephrotoxic meds - see med rec.   Consultants:  Nephrology   Procedures: none      ASSESSMENT & PLAN:  Acute renal failure superimposed on stage 4 chronic kidney disease (Russell Grant):  Possibly due to dehydration and continuation of diuretics, Lotensin and Cozarr.   Nephrology team following - cleared for discharge to follow outpatient hold nephrotoxins      Latest Ref Rng & Units 10/03/2022    8:08 AM 10/02/2022    4:35 AM 10/01/2022    5:41 AM  BMP  Glucose 70 - 99 mg/dL 119  99  96   BUN 6 - 20 mg/dL 65  80  83   Creatinine 0.61 - 1.24 mg/dL 3.32  3.75  4.03   Sodium 135 - 145 mmol/L 141  141  140   Potassium 3.5 - 5.1 mmol/L 4.1  4.0  3.8   Chloride 98 - 111 mmol/L 111  112  111   CO2 22 - 32 mmol/L '21  23  22   '$ Calcium 8.9 - 10.3 mg/dL 8.4  8.2  7.8     FSGS (focal segmental glomerulosclerosis), tip variant with nephrosis:  pt just stopped taking cyclosporine prior to admission AKI/ARF seems rapid for FSGS per nephrology,  more likely d/t dehdration, treating as above  f/u with Dr. Holley Grant    Essential hypertension hold Lotensin and Cozaar Hold torsemide Continue Coreg Started amlodipine    Type II diabetes mellitus with renal manifestations [facility]):  Recent A1c 7.1, poorly controlled.   Patient is taking [place] and Maujaro at home SSI   HLD (hyperlipidemia) Patient is not taking Statin currently Fenofibrate   Hypokalemia - resolved:  K 3.4 on admission --> 3.8 will not replete potassium due to worsening renal function Follow BMP and magnesium level outpatient   Elevated lipase: Lipase 138.   No abdominal pain.  Pancreas is unremarkable on CT scan.   Clinically does not have pancreatitis.   Gout Continue Febuxostat   Obesity, Class III, BMI 40-49.9 (morbid  obesity) (Russell Grant):  Body weight 129 kg, BMI 40.80 Encouraged losing weight Healthy diet and exercise             Discharge Instructions  Allergies as of 10/03/2022       Reactions   Hydrochlorothiazide Rash   Tacrolimus    Cefdinir Rash        Medication List     STOP taking these medications    losartan 50 MG tablet Commonly known as: COZAAR   torsemide 20 MG tablet Commonly known as: Demadex       TAKE these medications    amLODipine 10 MG tablet Commonly known as: NORVASC Take 1 tablet (10 mg total) by mouth daily. Start taking on: October 04, 2022   aspirin 81 MG chewable tablet Chew 1 tablet (81 mg total) by mouth daily.   Calcium 600-200 MG-UNIT tablet Take 1 tablet by mouth daily. Can take any over-the-counter supplement.   carvedilol 6.25 MG tablet Commonly known as: COREG Take 6.25 mg by mouth 2 (two) times daily.   Farxiga 10 MG Tabs tablet Generic drug: dapagliflozin propanediol Take 1 tablet by mouth daily.   Febuxostat 80 MG Tabs Take 1 tablet by mouth daily.   fenofibrate 145 MG tablet Commonly known as: TRICOR Take 145 mg by mouth daily.   Fish Oil Extra Strength 1200 MG Caps Take 2 capsules by mouth at bedtime.   loratadine 10 MG tablet Commonly known as: CLARITIN Take 10 mg by mouth daily.   Mounjaro 7.5 MG/0.5ML Pen Generic drug: tirzepatide Inject 7.5 mg into the skin once a week.   MULTIVITAMIN ADULTS PO Take 1 tablet by mouth daily.   rosuvastatin 20 MG tablet Commonly known as: CRESTOR Take 1 tablet (20 mg total) by mouth daily.   senna-docusate 8.6-50 MG tablet Commonly known as: Senokot-S Take 1 tablet by mouth daily.          Allergies  Allergen Reactions   Hydrochlorothiazide Rash   Tacrolimus    Cefdinir Rash     Subjective: pt has no complaints this morning, denies CP/SOB, no dizziness, no HA/VC, pain controlled (lower back pain chronic)   Discharge Exam: BP (!) 147/56 (BP Location:  Left Arm)   Pulse 74   Temp 99 F (37.2 C) (Oral)   Resp 18   Ht '5\' 10"'$  (1.778 m)   Wt 133.8 kg   SpO2 93%   BMI 42.32 kg/m  General: Pt is alert, awake, not in acute distress Cardiovascular: RRR, S1/S2 +, no rubs, no gallops Respiratory: CTA bilaterally, no wheezing, no rhonchi Abdominal: Soft, NT, ND, bowel sounds + Extremities: no edema, no cyanosis     The results of significant diagnostics from this hospitalization (including imaging,  microbiology, ancillary and laboratory) are listed below for reference.     Microbiology: No results found for this or any previous visit (from the past 240 hour(s)).   Labs: BNP (last 3 results) No results for input(s): "BNP" in the last 8760 hours. Basic Metabolic Panel: Recent Labs  Lab 09/30/22 1130 10/01/22 0541 10/02/22 0435 10/03/22 0808  NA 137 140 141 141  K 3.4* 3.8 4.0 4.1  CL 103 111 112* 111  CO2 '24 22 23 '$ 21*  GLUCOSE 101* 96 99 119*  BUN 84* 83* 80* 65*  CREATININE 4.25* 4.03* 3.75* 3.32*  CALCIUM 7.9* 7.8* 8.2* 8.4*   Liver Function Tests: Recent Labs  Lab 09/30/22 1130  AST 21  ALT 19  ALKPHOS 43  BILITOT 0.6  PROT 5.7*  ALBUMIN 2.5*   Recent Labs  Lab 09/30/22 1130 10/01/22 0541  LIPASE 138* 186*   No results for input(s): "AMMONIA" in the last 168 hours. CBC: Recent Labs  Lab 09/30/22 1130  WBC 8.0  HGB 11.2*  HCT 34.6*  MCV 90.3  PLT 243   Cardiac Enzymes: No results for input(s): "CKTOTAL", "CKMB", "CKMBINDEX", "TROPONINI" in the last 168 hours. BNP: Invalid input(s): "POCBNP" CBG: Recent Labs  Lab 10/02/22 0823 10/02/22 1204 10/02/22 1700 10/02/22 2122 10/03/22 0845  GLUCAP 101* 123* 104* 84 108*   D-Dimer No results for input(s): "DDIMER" in the last 72 hours. Hgb A1c No results for input(s): "HGBA1C" in the last 72 hours. Lipid Profile No results for input(s): "CHOL", "HDL", "LDLCALC", "TRIG", "CHOLHDL", "LDLDIRECT" in the last 72 hours. Thyroid function studies No  results for input(s): "TSH", "T4TOTAL", "T3FREE", "THYROIDAB" in the last 72 hours.  Invalid input(s): "FREET3" Anemia work up No results for input(s): "VITAMINB12", "FOLATE", "FERRITIN", "TIBC", "IRON", "RETICCTPCT" in the last 72 hours. Urinalysis    Component Value Date/Time   COLORURINE YELLOW (A) 09/30/2022 1130   APPEARANCEUR CLEAR (A) 09/30/2022 1130   APPEARANCEUR Turbid (A) 05/02/2019 0811   LABSPEC 1.028 09/30/2022 1130   PHURINE 5.0 09/30/2022 1130   GLUCOSEU NEGATIVE 09/30/2022 1130   HGBUR NEGATIVE 09/30/2022 1130   BILIRUBINUR NEGATIVE 09/30/2022 1130   BILIRUBINUR Negative 03/06/2020 1347   BILIRUBINUR Negative 05/02/2019 0811   KETONESUR NEGATIVE 09/30/2022 1130   PROTEINUR NEGATIVE 09/30/2022 1130   UROBILINOGEN 0.2 03/06/2020 1347   NITRITE NEGATIVE 09/30/2022 1130   LEUKOCYTESUR NEGATIVE 09/30/2022 1130   Sepsis Labs Recent Labs  Lab 09/30/22 1130  WBC 8.0   Microbiology No results found for this or any previous visit (from the past 240 hour(s)). Imaging CT ABDOMEN PELVIS WO CONTRAST  Result Date: 09/30/2022 CLINICAL DATA:  Left lower quadrant abdominal pain. Left flank pain and elevated creatinine. EXAM: CT ABDOMEN AND PELVIS WITHOUT CONTRAST TECHNIQUE: Multidetector CT imaging of the abdomen and pelvis was performed following the standard protocol without IV contrast. RADIATION DOSE REDUCTION: This exam was performed according to the departmental dose-optimization program which includes automated exposure control, adjustment of the mA and/or kV according to patient size and/or use of iterative reconstruction technique. COMPARISON:  Abdominal ultrasound 05/30/2020. FINDINGS: Lower chest: Clear lung bases.  No pleural or pericardial effusion. Hepatobiliary: No focal liver abnormality is seen. No gallstones, gallbladder wall thickening, or biliary dilatation. Pancreas: Unremarkable. No pancreatic ductal dilatation or surrounding inflammatory changes. Spleen:  Normal in size without focal abnormality. Adrenals/Urinary Tract: Normal adrenal glands. Kidneys are unremarkable. No renal calculi or hydronephrosis. Bladder is unremarkable. Stomach/Bowel: Unremarkable stomach and duodenal. No dilated loops of small bowel. Normal  appendix is visualized on coronal image 59 series 5. Decompressed colon. Mild sigmoid diverticulosis without evidence of acute diverticulitis. Normal rectum. Vascular/Lymphatic: No significant vascular findings are present. No enlarged abdominal or pelvic lymph nodes. Reproductive: Prostate is unremarkable. Other: No significant abdominal wall abnormality.  No ascites. Musculoskeletal: Multilevel lumbar spine degeneration. No suspicious bone lesions. IMPRESSION: 1. No acute abnormality in the abdomen or pelvis. 2. Mild sigmoid diverticulosis without evidence of acute diverticulitis. Electronically Signed   By: Emmit Alexanders M.D.   On: 09/30/2022 16:35      Time coordinating discharge: over 30 minutes  SIGNED:  Emeterio Reeve DO [facility]

## 2022-10-17 ENCOUNTER — Emergency Department: Payer: BC Managed Care – PPO

## 2022-10-17 ENCOUNTER — Other Ambulatory Visit: Payer: Self-pay

## 2022-10-17 ENCOUNTER — Inpatient Hospital Stay
Admission: EM | Admit: 2022-10-17 | Discharge: 2022-10-20 | DRG: 683 | Disposition: A | Discharge status: Home / Self Care | Payer: BC Managed Care – PPO | Attending: Student | Admitting: Student

## 2022-10-17 DIAGNOSIS — E559 Vitamin D deficiency, unspecified: Secondary | ICD-10-CM | POA: Diagnosis present

## 2022-10-17 DIAGNOSIS — M109 Gout, unspecified: Secondary | ICD-10-CM | POA: Diagnosis present

## 2022-10-17 DIAGNOSIS — Z7985 Long-term (current) use of injectable non-insulin antidiabetic drugs: Secondary | ICD-10-CM

## 2022-10-17 DIAGNOSIS — Z6841 Body Mass Index (BMI) 40.0 and over, adult: Secondary | ICD-10-CM

## 2022-10-17 DIAGNOSIS — Z7982 Long term (current) use of aspirin: Secondary | ICD-10-CM | POA: Diagnosis not present

## 2022-10-17 DIAGNOSIS — R0602 Shortness of breath: Secondary | ICD-10-CM | POA: Insufficient documentation

## 2022-10-17 DIAGNOSIS — Z7984 Long term (current) use of oral hypoglycemic drugs: Secondary | ICD-10-CM | POA: Diagnosis not present

## 2022-10-17 DIAGNOSIS — D631 Anemia in chronic kidney disease: Secondary | ICD-10-CM | POA: Diagnosis present

## 2022-10-17 DIAGNOSIS — N179 Acute kidney failure, unspecified: Principal | ICD-10-CM | POA: Diagnosis present

## 2022-10-17 DIAGNOSIS — Z888 Allergy status to other drugs, medicaments and biological substances status: Secondary | ICD-10-CM | POA: Diagnosis not present

## 2022-10-17 DIAGNOSIS — D72829 Elevated white blood cell count, unspecified: Secondary | ICD-10-CM | POA: Diagnosis present

## 2022-10-17 DIAGNOSIS — N184 Chronic kidney disease, stage 4 (severe): Secondary | ICD-10-CM | POA: Diagnosis present

## 2022-10-17 DIAGNOSIS — R601 Generalized edema: Secondary | ICD-10-CM | POA: Diagnosis present

## 2022-10-17 DIAGNOSIS — I10 Essential (primary) hypertension: Secondary | ICD-10-CM | POA: Diagnosis not present

## 2022-10-17 DIAGNOSIS — T502X5A Adverse effect of carbonic-anhydrase inhibitors, benzothiadiazides and other diuretics, initial encounter: Secondary | ICD-10-CM | POA: Diagnosis present

## 2022-10-17 DIAGNOSIS — E876 Hypokalemia: Secondary | ICD-10-CM | POA: Diagnosis present

## 2022-10-17 DIAGNOSIS — E1122 Type 2 diabetes mellitus with diabetic chronic kidney disease: Secondary | ICD-10-CM | POA: Diagnosis present

## 2022-10-17 DIAGNOSIS — E1129 Type 2 diabetes mellitus with other diabetic kidney complication: Secondary | ICD-10-CM | POA: Diagnosis not present

## 2022-10-17 DIAGNOSIS — Z79899 Other long term (current) drug therapy: Secondary | ICD-10-CM

## 2022-10-17 DIAGNOSIS — Z8249 Family history of ischemic heart disease and other diseases of the circulatory system: Secondary | ICD-10-CM | POA: Diagnosis not present

## 2022-10-17 DIAGNOSIS — Z881 Allergy status to other antibiotic agents status: Secondary | ICD-10-CM

## 2022-10-17 DIAGNOSIS — N032 Chronic nephritic syndrome with diffuse membranous glomerulonephritis: Secondary | ICD-10-CM | POA: Diagnosis present

## 2022-10-17 DIAGNOSIS — R609 Edema, unspecified: Principal | ICD-10-CM

## 2022-10-17 DIAGNOSIS — Z85828 Personal history of other malignant neoplasm of skin: Secondary | ICD-10-CM | POA: Diagnosis not present

## 2022-10-17 DIAGNOSIS — I129 Hypertensive chronic kidney disease with stage 1 through stage 4 chronic kidney disease, or unspecified chronic kidney disease: Secondary | ICD-10-CM | POA: Diagnosis present

## 2022-10-17 DIAGNOSIS — I428 Other cardiomyopathies: Secondary | ICD-10-CM | POA: Diagnosis not present

## 2022-10-17 DIAGNOSIS — Z981 Arthrodesis status: Secondary | ICD-10-CM

## 2022-10-17 DIAGNOSIS — E785 Hyperlipidemia, unspecified: Secondary | ICD-10-CM | POA: Diagnosis present

## 2022-10-17 DIAGNOSIS — N189 Chronic kidney disease, unspecified: Secondary | ICD-10-CM

## 2022-10-17 DIAGNOSIS — E66813 Obesity, class 3: Secondary | ICD-10-CM | POA: Diagnosis present

## 2022-10-17 LAB — CBC WITH DIFFERENTIAL/PLATELET
Abs Immature Granulocytes: 0.26 10*3/uL — ABNORMAL HIGH (ref 0.00–0.07)
Basophils Absolute: 0 10*3/uL (ref 0.0–0.1)
Basophils Relative: 0 %
Eosinophils Absolute: 0 10*3/uL (ref 0.0–0.5)
Eosinophils Relative: 0 %
HCT: 31.2 % — ABNORMAL LOW (ref 39.0–52.0)
Hemoglobin: 10.4 g/dL — ABNORMAL LOW (ref 13.0–17.0)
Immature Granulocytes: 2 %
Lymphocytes Relative: 7 %
Lymphs Abs: 1.1 10*3/uL (ref 0.7–4.0)
MCH: 29.3 pg (ref 26.0–34.0)
MCHC: 33.3 g/dL (ref 30.0–36.0)
MCV: 87.9 fL (ref 80.0–100.0)
Monocytes Absolute: 0.9 10*3/uL (ref 0.1–1.0)
Monocytes Relative: 6 %
Neutro Abs: 12.7 10*3/uL — ABNORMAL HIGH (ref 1.7–7.7)
Neutrophils Relative %: 85 %
Platelets: 285 10*3/uL (ref 150–400)
RBC: 3.55 MIL/uL — ABNORMAL LOW (ref 4.22–5.81)
RDW: 14 % (ref 11.5–15.5)
WBC: 15 10*3/uL — ABNORMAL HIGH (ref 4.0–10.5)
nRBC: 0 % (ref 0.0–0.2)

## 2022-10-17 LAB — BASIC METABOLIC PANEL
Anion gap: 10 (ref 5–15)
BUN: 46 mg/dL — ABNORMAL HIGH (ref 6–20)
CO2: 21 mmol/L — ABNORMAL LOW (ref 22–32)
Calcium: 7.2 mg/dL — ABNORMAL LOW (ref 8.9–10.3)
Chloride: 108 mmol/L (ref 98–111)
Creatinine, Ser: 3.88 mg/dL — ABNORMAL HIGH (ref 0.61–1.24)
GFR, Estimated: 18 mL/min — ABNORMAL LOW (ref >60)
Glucose, Bld: 176 mg/dL — ABNORMAL HIGH (ref 70–99)
Potassium: 3 mmol/L — ABNORMAL LOW (ref 3.5–5.1)
Sodium: 139 mmol/L (ref 135–145)

## 2022-10-17 LAB — BRAIN NATRIURETIC PEPTIDE: B Natriuretic Peptide: 770.2 pg/mL — ABNORMAL HIGH (ref 0.0–100.0)

## 2022-10-17 LAB — CBG MONITORING, ED: Glucose-Capillary: 96 mg/dL (ref 70–99)

## 2022-10-17 LAB — GLUCOSE, CAPILLARY: Glucose-Capillary: 94 mg/dL (ref 70–99)

## 2022-10-17 LAB — MAGNESIUM: Magnesium: 2.2 mg/dL (ref 1.7–2.4)

## 2022-10-17 LAB — TROPONIN I (HIGH SENSITIVITY): Troponin I (High Sensitivity): 15 ng/L (ref <18)

## 2022-10-17 MED ORDER — METOLAZONE 5 MG PO TABS
5.0000 mg | ORAL_TABLET | Freq: Two times a day (BID) | ORAL | Status: DC
  Administered 2022-10-17 – 2022-10-18 (×2): 5 mg via ORAL
  Filled 2022-10-17 (×2): qty 1

## 2022-10-17 MED ORDER — OMEGA-3-ACID ETHYL ESTERS 1 G PO CAPS
2.0000 | ORAL_CAPSULE | Freq: Two times a day (BID) | ORAL | Status: DC
  Administered 2022-10-17 – 2022-10-20 (×6): 2 g via ORAL
  Filled 2022-10-17 (×7): qty 2

## 2022-10-17 MED ORDER — FENOFIBRATE 160 MG PO TABS
160.0000 mg | ORAL_TABLET | Freq: Every day | ORAL | Status: DC
  Administered 2022-10-18 – 2022-10-20 (×3): 160 mg via ORAL
  Filled 2022-10-17 (×3): qty 1

## 2022-10-17 MED ORDER — DIPHENHYDRAMINE HCL 50 MG/ML IJ SOLN: 12.5000 mg | Freq: Three times a day (TID) | INTRAMUSCULAR | Status: DC | PRN

## 2022-10-17 MED ORDER — HEPARIN SODIUM (PORCINE) 5000 UNIT/ML IJ SOLN
5000.0000 [IU] | Freq: Three times a day (TID) | INTRAMUSCULAR | Status: DC
  Filled 2022-10-17 (×4): qty 1

## 2022-10-17 MED ORDER — INSULIN ASPART 100 UNIT/ML IJ SOLN: 0.0000 [IU] | Freq: Three times a day (TID) | INTRAMUSCULAR | Status: DC

## 2022-10-17 MED ORDER — INSULIN ASPART 100 UNIT/ML IJ SOLN: 0.0000 [IU] | Freq: Every day | INTRAMUSCULAR | Status: DC

## 2022-10-17 MED ORDER — LORATADINE 10 MG PO TABS
10.0000 mg | ORAL_TABLET | Freq: Every day | ORAL | Status: DC
  Administered 2022-10-17 – 2022-10-20 (×4): 10 mg via ORAL
  Filled 2022-10-17 (×4): qty 1

## 2022-10-17 MED ORDER — ALBUTEROL SULFATE (2.5 MG/3ML) 0.083% IN NEBU: 3.0000 mL | INHALATION_SOLUTION | RESPIRATORY_TRACT | Status: DC | PRN

## 2022-10-17 MED ORDER — FUROSEMIDE 10 MG/ML IJ SOLN
40.0000 mg | Freq: Three times a day (TID) | INTRAMUSCULAR | Status: DC
  Administered 2022-10-17 – 2022-10-19 (×5): 40 mg via INTRAVENOUS
  Filled 2022-10-17 (×5): qty 4

## 2022-10-17 MED ORDER — FEBUXOSTAT 40 MG PO TABS
80.0000 mg | ORAL_TABLET | Freq: Every day | ORAL | Status: DC
  Administered 2022-10-17 – 2022-10-20 (×4): 80 mg via ORAL
  Filled 2022-10-17 (×4): qty 2

## 2022-10-17 MED ORDER — AMLODIPINE BESYLATE 10 MG PO TABS
10.0000 mg | ORAL_TABLET | Freq: Every day | ORAL | Status: DC
  Administered 2022-10-18 – 2022-10-20 (×3): 10 mg via ORAL
  Filled 2022-10-17 (×3): qty 1

## 2022-10-17 MED ORDER — CARVEDILOL 6.25 MG PO TABS
6.2500 mg | ORAL_TABLET | Freq: Two times a day (BID) | ORAL | Status: DC
  Administered 2022-10-17 – 2022-10-20 (×6): 6.25 mg via ORAL
  Filled 2022-10-17 (×6): qty 1

## 2022-10-17 MED ORDER — ROSUVASTATIN CALCIUM 10 MG PO TABS
20.0000 mg | ORAL_TABLET | Freq: Every day | ORAL | Status: DC
  Administered 2022-10-17 – 2022-10-20 (×4): 20 mg via ORAL
  Filled 2022-10-17: qty 2
  Filled 2022-10-17: qty 1
  Filled 2022-10-17 (×2): qty 2

## 2022-10-17 MED ORDER — ADULT MULTIVITAMIN W/MINERALS CH
ORAL_TABLET | Freq: Every day | ORAL | Status: DC
  Administered 2022-10-17 – 2022-10-20 (×4): 1 via ORAL
  Filled 2022-10-17 (×4): qty 1

## 2022-10-17 MED ORDER — DM-GUAIFENESIN ER 30-600 MG PO TB12: 1.0000 | ORAL_TABLET | Freq: Two times a day (BID) | ORAL | Status: DC | PRN

## 2022-10-17 MED ORDER — FUROSEMIDE 10 MG/ML IJ SOLN: 60.0000 mg | Freq: Two times a day (BID) | INTRAMUSCULAR | Status: DC

## 2022-10-17 MED ORDER — FUROSEMIDE 10 MG/ML IJ SOLN
40.0000 mg | Freq: Three times a day (TID) | INTRAMUSCULAR | Status: DC
  Filled 2022-10-17: qty 4

## 2022-10-17 MED ORDER — POTASSIUM CHLORIDE CRYS ER 20 MEQ PO TBCR
60.0000 meq | EXTENDED_RELEASE_TABLET | Freq: Once | ORAL | Status: AC
  Administered 2022-10-17: 60 meq via ORAL
  Filled 2022-10-17: qty 3

## 2022-10-17 MED ORDER — FUROSEMIDE 10 MG/ML IJ SOLN
40.0000 mg | Freq: Three times a day (TID) | INTRAMUSCULAR | Status: DC
  Administered 2022-10-17: 40 mg via INTRAVENOUS
  Filled 2022-10-17: qty 4

## 2022-10-17 MED ORDER — ACETAMINOPHEN 325 MG PO TABS: 650.0000 mg | ORAL_TABLET | Freq: Four times a day (QID) | ORAL | Status: DC | PRN

## 2022-10-17 MED ORDER — ASPIRIN 81 MG PO CHEW
81.0000 mg | CHEWABLE_TABLET | Freq: Every day | ORAL | Status: DC
  Administered 2022-10-17 – 2022-10-20 (×4): 81 mg via ORAL
  Filled 2022-10-17 (×4): qty 1

## 2022-10-17 MED ORDER — HYDRALAZINE HCL 20 MG/ML IJ SOLN: 5.0000 mg | INTRAMUSCULAR | Status: DC | PRN

## 2022-10-17 NOTE — ED Triage Notes (Signed)
CKD stage 4 patient presents today with approx. 20 lb weight gain over the past week; No chest pain but is having some exertional dyspnea
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
## 2022-10-17 NOTE — ED Provider Notes (Signed)
[facility] Provider Note    Event Date/Time   First MD Initiated Contact with Patient 10/17/22 1518     (approximate)   History   Edema (CKD stage 4 patient presents today with approx. 20 lb weight gain over the past week; No chest pain but is having some exertional dyspnea)   HPI  Russell Grant is a 52 y.o. male with a history of stage IV CKD, FSGS, hypertension, type 2 diabetes, hyperlipidemia, and obesity who presents with worsening edema and weight gain over the last week, persistent course, and associated with some exertional shortness of breath.  The patient reports significant leg swelling, some joint pain, and swelling to the abdomen.  He denies any fever or chills.  He has no significant cough.  The patient was admitted a few weeks ago for AKI and at that time his torsemide was held.  The patient restarted 100 mg daily last week.  He talked to his nephrologist a few days ago and started on 200 mg again although the swelling has gone worse despite this.  I the past medical records.  I can from that the patient was admitted last month.  Per the hospitalist discharge summary from 1/27 he presented with acutely elevated creatinine.  This AKI was thought to be due to diuretics and other nephrotoxic medications which were held.   Physical Exam   Triage Vital Signs: ED Triage Vitals  Enc Vitals Group     BP 10/17/22 1451 (!) 171/69     Pulse Rate 10/17/22 1451 91     Resp 10/17/22 1451 20     Temp 10/17/22 1451 98.6 F (37 C)     Temp Source 10/17/22 1451 Oral     SpO2 10/17/22 1451 98 %     Weight 10/17/22 1503 (!) 306 lb 7 oz (139 kg)     Height 10/17/22 1450 5' 10"$  (1.778 m)     Head Circumference --      Peak Flow --      Pain Score 10/17/22 1449 0     Pain Loc --      Pain Edu? --      Excl. in [place]? --     Most recent vital signs: Vitals:   10/17/22 1451  BP: (!) 171/69  Pulse: 91  Resp: 20  Temp: 98.6 F (37 C)  SpO2: 98%      General: Awake, no distress.  CV:  Good peripheral perfusion.  Resp:  Lungs CTAB.  Normal effort.  Abd:  Abdominal wall edema. Other:  3+ bilateral lower extremity edema.   ED Results / Procedures / Treatments   Labs (all labs ordered are listed, but only abnormal results are displayed) Labs Reviewed  CBC WITH DIFFERENTIAL/PLATELET - Abnormal; Notable for the following components:      Result Value   WBC 15.0 (*)    RBC 3.55 (*)    Hemoglobin 10.4 (*)    HCT 31.2 (*)    Neutro Abs 12.7 (*)    Abs Immature Granulocytes 0.26 (*)    All other components within normal limits  BASIC METABOLIC PANEL - Abnormal; Notable for the following components:   Potassium 3.0 (*)    CO2 21 (*)    Glucose, Bld 176 (*)    BUN 46 (*)    Creatinine, Ser 3.88 (*)    Calcium 7.2 (*)    GFR, Estimated 18 (*)    All other components within normal  limits  BRAIN NATRIURETIC PEPTIDE  MAGNESIUM  TROPONIN I (HIGH SENSITIVITY)     EKG  ED ECG REPORT I, Arta Silence, the attending physician, personally viewed and interpreted this ECG.  Date: 10/17/2022 EKG Time: 1450 Rate: 91 Rhythm: normal sinus rhythm QRS Axis: normal Intervals: RBBB the ST/T Wave abnormalities: normal Narrative Interpretation: no evidence of acute ischemia    RADIOLOGY  Chest x-ray: I independently viewed and interpreted the images; there is no focal consolidation or edema  PROCEDURES:  Critical Care performed: No  Procedures   MEDICATIONS ORDERED IN ED: Medications  metolazone (ZAROXOLYN) tablet 5 mg (has no administration in time range)  furosemide (LASIX) injection 40 mg (has no administration in time range)  albuterol (PROVENTIL) (2.5 MG/3ML) 0.083% nebulizer solution 3 mL (has no administration in time range)  dextromethorphan-guaiFENesin (MUCINEX DM) 30-600 MG per 12 hr tablet 1 tablet (has no administration in time range)  potassium chloride SA (KLOR-CON M) CR tablet 60 mEq (has no  administration in time range)  heparin injection 5,000 Units (has no administration in time range)     IMPRESSION / MDM / ASSESSMENT AND PLAN / ED COURSE  I reviewed the triage vital signs and the nursing notes.  52 year old male with PMH as noted above presents with worsening edema despite recent increase in his torsemide dose.  On exam the patient has significant edema but no respiratory distress.  O2 saturation is in the high 90s on room air.  Differential diagnosis includes, but is not limited to, CKD, CHF, fluid overload due to other etiology.  Given the patient's recent admission for AKI and his overall complex course I will consult nephrology for further recommendations including recommendations on diuresis.  Patient's presentation is most consistent with acute presentation with potential threat to life or bodily function.  The patient is on the cardiac monitor to evaluate for evidence of arrhythmia and/or significant heart rate changes.  ----------------------------------------- 4:45 PM on 10/17/2022 -----------------------------------------  Lab workup shows creatinine of 3.88 with GFR of 18.  The patient has mild leukocytosis.  Troponin is negative.  I consulted and discussed the case with Dr. Joylene John from nephrology who recommends starting the patient on IV Lasix and p.o. metolazone.  He does not recommend albumin at this time.  I then consulted Dr. Blaine Hamper from the hospitalist service; based our discussion he agrees to admit the patient.  FINAL CLINICAL IMPRESSION(S) / ED DIAGNOSES   Final diagnoses:  Edema, unspecified type  Chronic kidney disease, unspecified CKD stage     Rx / DC Orders   ED Discharge Orders     None        Note:  This document was prepared using Dragon voice recognition software and may include unintentional dictation errors.    Arta Silence, MD 10/17/22 [phone]

## 2022-10-18 ENCOUNTER — Inpatient Hospital Stay (HOSPITAL_COMMUNITY)
Admit: 2022-10-18 | Discharge: 2022-10-18 | Disposition: A | Discharge status: Home / Self Care | Payer: BC Managed Care – PPO | Attending: Student | Admitting: Student

## 2022-10-18 DIAGNOSIS — I428 Other cardiomyopathies: Secondary | ICD-10-CM | POA: Diagnosis not present

## 2022-10-18 DIAGNOSIS — R601 Generalized edema: Secondary | ICD-10-CM | POA: Diagnosis not present

## 2022-10-18 LAB — GLUCOSE, CAPILLARY
Glucose-Capillary: 103 mg/dL — ABNORMAL HIGH (ref 70–99)
Glucose-Capillary: 107 mg/dL — ABNORMAL HIGH (ref 70–99)
Glucose-Capillary: 97 mg/dL (ref 70–99)
Glucose-Capillary: 126 mg/dL — ABNORMAL HIGH (ref 70–99)

## 2022-10-18 LAB — BASIC METABOLIC PANEL
Anion gap: 5 (ref 5–15)
BUN: 45 mg/dL — ABNORMAL HIGH (ref 6–20)
CO2: 26 mmol/L (ref 22–32)
Calcium: 7.5 mg/dL — ABNORMAL LOW (ref 8.9–10.3)
Chloride: 110 mmol/L (ref 98–111)
Creatinine, Ser: 3.61 mg/dL — ABNORMAL HIGH (ref 0.61–1.24)
GFR, Estimated: 19 mL/min — ABNORMAL LOW (ref >60)
Glucose, Bld: 105 mg/dL — ABNORMAL HIGH (ref 70–99)
Potassium: 3.1 mmol/L — ABNORMAL LOW (ref 3.5–5.1)
Sodium: 141 mmol/L (ref 135–145)

## 2022-10-18 LAB — CBC
HCT: 30.5 % — ABNORMAL LOW (ref 39.0–52.0)
Hemoglobin: 10.1 g/dL — ABNORMAL LOW (ref 13.0–17.0)
MCH: 29 pg (ref 26.0–34.0)
MCHC: 33.1 g/dL (ref 30.0–36.0)
MCV: 87.6 fL (ref 80.0–100.0)
Platelets: 255 10*3/uL (ref 150–400)
RBC: 3.48 MIL/uL — ABNORMAL LOW (ref 4.22–5.81)
RDW: 14.3 % (ref 11.5–15.5)
WBC: 14.1 10*3/uL — ABNORMAL HIGH (ref 4.0–10.5)
nRBC: 0 % (ref 0.0–0.2)

## 2022-10-18 LAB — ECHOCARDIOGRAM COMPLETE
AR max vel: 1.98 cm2
AV Area VTI: 1.94 cm2
AV Area mean vel: 1.97 cm2
AV Mean grad: 4.5 mmHg
AV Peak grad: 8.7 mmHg
Ao pk vel: 1.48 m/s
Area-P 1/2: 4.31 cm2
Calc EF: 62 %
Height: 70 in
S' Lateral: 3.1 cm
Single Plane A2C EF: 46.3 %
Single Plane A4C EF: 70 %
Weight: 4701.97 oz

## 2022-10-18 LAB — URIC ACID: Uric Acid, Serum: 2.9 mg/dL — ABNORMAL LOW (ref 3.7–8.6)

## 2022-10-18 LAB — IRON AND TIBC
Iron: 71 ug/dL (ref 45–182)
Saturation Ratios: 24 % (ref 17.9–39.5)
TIBC: 291 ug/dL (ref 250–450)
UIBC: 220 ug/dL

## 2022-10-18 LAB — PHOSPHORUS: Phosphorus: 3.6 mg/dL (ref 2.5–4.6)

## 2022-10-18 LAB — MAGNESIUM: Magnesium: 2.2 mg/dL (ref 1.7–2.4)

## 2022-10-18 LAB — PROCALCITONIN: Procalcitonin: 0.21 ng/mL

## 2022-10-18 LAB — VITAMIN B12: Vitamin B-12: 454 pg/mL (ref 180–914)

## 2022-10-18 LAB — FOLATE: Folate: 13.5 ng/mL (ref >5.9)

## 2022-10-18 MED ORDER — METOLAZONE 2.5 MG PO TABS
2.5000 mg | ORAL_TABLET | Freq: Two times a day (BID) | ORAL | Status: DC
  Administered 2022-10-18 – 2022-10-19 (×2): 2.5 mg via ORAL
  Filled 2022-10-18 (×2): qty 1

## 2022-10-18 MED ORDER — POTASSIUM CHLORIDE CRYS ER 20 MEQ PO TBCR
40.0000 meq | EXTENDED_RELEASE_TABLET | Freq: Once | ORAL | Status: AC
  Administered 2022-10-18: 40 meq via ORAL
  Filled 2022-10-18: qty 2

## 2022-10-18 NOTE — Progress Notes (Signed)
[facility] Progress Note  Patient: Russell Grant    [id number]  DOA: 10/17/2022     Date of Service: the patient was seen and examined on 10/18/2022  Chief Complaint  Patient presents with   Edema    CKD stage 4 patient presents today with approx. 20 lb weight gain over the past week; No chest pain but is having some exertional dyspnea   Brief hospital course: Russell Grant is a 52 y.o. male with medical history significant of  FSGS, CKD-4 (on transplant list with [facility]), hypertension, hyperlipidemia, diabetes mellitus, gout,skin cancer, who presents with SOB and weight gain. Patient states that he he has worsening bilateral leg edema, weight gain of more than 15 pounds in the last week, associated with exertional shortness breath.   ED w/up: VS BP 171/69 elevated, rest within normal range WBC 15.0, slightly worsening renal function, potassium 3.   Assessment and Plan: Principal Problem:   Anasarca Active Problems:   CKD (chronic kidney disease) stage 4, GFR 15-29 ml/min (HCC)   FSGS (focal segmental glomerulosclerosis), tip variant with nephrosis   Essential hypertension   Type II diabetes mellitus with renal manifestations (HCC)   HLD (hyperlipidemia)   Hypokalemia   Gout   Leukocytosis   Obesity, Class III, BMI 40-49.9 (morbid obesity) ([place])   Anasarca due to CKD-4: His anasarca is likely due to CKD 4.  2D echo 05/08/2020 showed EF 65-70%.  ED physician consulted Dr. Joylene John of renal, recommended to start patient on Lasix 40 mg 3 times daily and metolazone 5 mg twice daily -As needed albuterol for shortness of breath BNP 770 elevated [place] TTE r/o CHF?    FSGS (focal segmental glomerulosclerosis), tip variant with nephrosis: pt stopped taking cyclosporine recently -f/u with [name] of renal   Essential hypertension -IV hydralazine as needed -on IV lasix -Continue Coreg and amlodipine   Type II diabetes mellitus with renal manifestations [facility]): Recent A1c  7.1, well controlled.  Patient is taking [place] and Maujaro -SSI   HLD (hyperlipidemia) -Fenofibrate and crestor   Hypokalemia: K 3.0--3.1 -Repleted potassium magnesium level 2.2 wnl   Gout -Continue Febuxostat Uric acid 2.9 low   Obesity, Class III, BMI 40-49.9 (morbid obesity) ([place]): Body weight 139 kg, BMI 43.97 -Encourage losing weight -Healthy diet and exercise   Leukocytosis: WBC 15.0, no source of infection identified.  No fever.  Likely reactive Procal 0.21 low -Follow-up with CBC   Body mass index is 42.17 kg/m.  Interventions:   Diet:  DVT Prophylaxis: Subcutaneous Heparin    Advance goals of care discussion: Full code  Family Communication: family was present at bedside, at the time of interview.  The pt provided permission to discuss medical plan with the family. Opportunity was given to ask question and all questions were answered satisfactorily.   Disposition:  Pt is from Home, admitted with anasarca, still volume overload and on IV Lasix, which precludes a safe discharge. Discharge to Home, when clinically stable, may need 1-2 more days to improve.  Subjective: No significant events overnight, patient feels improvement in the shortness of breath and also feels improvement in the lower extremity edema, making good amount of urine.  Denied any worsening of shortness of breath, no chest pain palpitation, no abdominal pain, no nausea vomiting or diarrhea.  Physical Exam: General: NAD, lying comfortably Appear in no distress, affect appropriate Eyes: PERRLA ENT: Oral Mucosa Clear, moist  Neck: no JVD,  Cardiovascular: S1 and S2 Present, no Murmur,  Respiratory: good respiratory effort, Bilateral Air entry equal and Decreased, no Crackles, no wheezes Abdomen: Bowel Sound present, Soft and no tenderness, obese Skin: no rashes Extremities: 4+ Pedal edema, no calf tenderness Neurologic: without any new focal findings Gait not checked due to patient safety  concerns  Vitals:   10/17/22 2116 10/18/22 0414 10/18/22 0500 10/18/22 0823  BP: (!) 181/91 (!) 140/62  (!) 154/78  Pulse: 86 70  66  Resp: 18 20  18  $ Temp: 98.4 F (36.9 C) (!) 97.4 F (36.3 C)  98.9 F (37.2 C)  TempSrc: Oral Oral  Oral  SpO2: 98% 99%  100%  Weight:   133.3 kg   Height:        Intake/Output Summary (Last 24 hours) at 10/18/2022 1132 Last data filed at 10/18/2022 1124 Gross per 24 hour  Intake --  Output 6125 ml  Net -6125 ml   Filed Weights   10/17/22 1503 10/18/22 0500  Weight: (!) 139 kg 133.3 kg    Data Reviewed: I have personally reviewed and interpreted daily labs, tele strips, imagings as discussed above. I reviewed all nursing notes, pharmacy notes, vitals, pertinent old records I have discussed plan of care as described above with RN and patient/family.  CBC: Recent Labs  Lab 10/17/22 1506 10/18/22 0235  WBC 15.0* 14.1*  NEUTROABS 12.7*  --   HGB 10.4* 10.1*  HCT 31.2* 30.5*  MCV 87.9 87.6  PLT [id number]   Basic Metabolic Panel: Recent Labs  Lab 10/17/22 1504 10/17/22 1506 10/18/22 0235  NA  --  139 141  K  --  3.0* 3.1*  CL  --  108 110  CO2  --  21* 26  GLUCOSE  --  176* 105*  BUN  --  46* 45*  CREATININE  --  3.88* 3.61*  CALCIUM  --  7.2* 7.5*  MG 2.2  --  2.2    Studies: DG Chest 2 View  Result Date: 10/17/2022 CLINICAL DATA:  Edema EXAM: CHEST - 2 VIEW COMPARISON:  08/04/2020 FINDINGS: The heart size and mediastinal contours are within normal limits. No focal airspace consolidation, pleural effusion, or pneumothorax. The visualized skeletal structures are unremarkable. IMPRESSION: No active cardiopulmonary disease. Electronically Signed   By: Davina Poke D.O.   On: 10/17/2022 15:42    Scheduled Meds:  amLODipine  10 mg Oral Daily   aspirin  81 mg Oral Daily   carvedilol  6.25 mg Oral BID   febuxostat  80 mg Oral Daily   fenofibrate  160 mg Oral Daily   furosemide  40 mg Intravenous Q8H   heparin  5,000 Units  Subcutaneous Q8H   insulin aspart  0-5 Units Subcutaneous QHS   insulin aspart  0-9 Units Subcutaneous TID WC   loratadine  10 mg Oral Daily   metolazone  2.5 mg Oral BID   multivitamin with minerals   Oral Daily   omega-3 acid ethyl esters  2 capsule Oral BID   rosuvastatin  20 mg Oral Daily   Continuous Infusions: PRN Meds: acetaminophen, albuterol, dextromethorphan-guaiFENesin, diphenhydrAMINE, hydrALAZINE  Time spent: 55 minutes  Author: Val Riles. MD [facility] 10/18/2022 11:32 AM  To reach On-call, see care teams to locate the attending and reach out to them via [email or web address]. If 7PM-7AM, please contact night-coverage If you still have difficulty reaching the attending provider, please page the [facility] (Director on Call) for [facility] on amion for assistance.
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
## 2022-10-18 NOTE — Plan of Care (Signed)
  Problem: Education: Goal: Ability to demonstrate management of disease process will improve Outcome: Progressing Goal: Ability to verbalize understanding of medication therapies will improve Outcome: Progressing Goal: Individualized Educational Video(s) Outcome: Progressing   Problem: Activity: Goal: Capacity to carry out activities will improve Outcome: Progressing   

## 2022-10-19 DIAGNOSIS — R601 Generalized edema: Secondary | ICD-10-CM | POA: Diagnosis not present

## 2022-10-19 LAB — BASIC METABOLIC PANEL
Anion gap: 6 (ref 5–15)
BUN: 53 mg/dL — ABNORMAL HIGH (ref 6–20)
CO2: 30 mmol/L (ref 22–32)
Calcium: 8.6 mg/dL — ABNORMAL LOW (ref 8.9–10.3)
Chloride: 103 mmol/L (ref 98–111)
Creatinine, Ser: 3.51 mg/dL — ABNORMAL HIGH (ref 0.61–1.24)
GFR, Estimated: 20 mL/min — ABNORMAL LOW (ref >60)
Glucose, Bld: 109 mg/dL — ABNORMAL HIGH (ref 70–99)
Potassium: 3.6 mmol/L (ref 3.5–5.1)
Sodium: 139 mmol/L (ref 135–145)

## 2022-10-19 LAB — CBC
HCT: 34.3 % — ABNORMAL LOW (ref 39.0–52.0)
Hemoglobin: 11.3 g/dL — ABNORMAL LOW (ref 13.0–17.0)
MCH: 28.6 pg (ref 26.0–34.0)
MCHC: 32.9 g/dL (ref 30.0–36.0)
MCV: 86.8 fL (ref 80.0–100.0)
Platelets: 277 10*3/uL (ref 150–400)
RBC: 3.95 MIL/uL — ABNORMAL LOW (ref 4.22–5.81)
RDW: 14.2 % (ref 11.5–15.5)
WBC: 11.1 10*3/uL — ABNORMAL HIGH (ref 4.0–10.5)
nRBC: 0 % (ref 0.0–0.2)

## 2022-10-19 LAB — MAGNESIUM: Magnesium: 2.3 mg/dL (ref 1.7–2.4)

## 2022-10-19 LAB — GLUCOSE, CAPILLARY
Glucose-Capillary: 102 mg/dL — ABNORMAL HIGH (ref 70–99)
Glucose-Capillary: 119 mg/dL — ABNORMAL HIGH (ref 70–99)
Glucose-Capillary: 114 mg/dL — ABNORMAL HIGH (ref 70–99)
Glucose-Capillary: 127 mg/dL — ABNORMAL HIGH (ref 70–99)

## 2022-10-19 LAB — MISC LABCORP TEST (SEND OUT): Labcorp test code: 81950

## 2022-10-19 LAB — PHOSPHORUS: Phosphorus: 5.1 mg/dL — ABNORMAL HIGH (ref 2.5–4.6)

## 2022-10-19 MED ORDER — SENNOSIDES-DOCUSATE SODIUM 8.6-50 MG PO TABS
1.0000 | ORAL_TABLET | Freq: Every day | ORAL | Status: DC
  Administered 2022-10-19 – 2022-10-20 (×2): 1 via ORAL
  Filled 2022-10-19 (×2): qty 1

## 2022-10-19 MED ORDER — VITAMIN D (ERGOCALCIFEROL) 1.25 MG (50000 UNIT) PO CAPS
50000.0000 [IU] | ORAL_CAPSULE | ORAL | Status: DC
  Administered 2022-10-19: 50000 [IU] via ORAL
  Filled 2022-10-19: qty 1

## 2022-10-19 MED ORDER — METOLAZONE 2.5 MG PO TABS: 2.5000 mg | ORAL_TABLET | Freq: Every day | ORAL | Status: DC | PRN

## 2022-10-19 MED ORDER — BISACODYL 10 MG RE SUPP: 10.0000 mg | Freq: Every day | RECTAL | Status: DC | PRN

## 2022-10-19 MED ORDER — TORSEMIDE 20 MG PO TABS
100.0000 mg | ORAL_TABLET | Freq: Every day | ORAL | Status: DC
  Administered 2022-10-19 – 2022-10-20 (×2): 100 mg via ORAL
  Filled 2022-10-19 (×2): qty 5

## 2022-10-19 NOTE — TOC Initial Note (Addendum)
Transition of Care [facility]) - Initial/Assessment Note    Patient Details  Name: Russell Grant MRN: [id number] Date of Birth: [birth year]  Transition of Care [facility]) CM/SW Contact:    Beverly Sessions, RN Phone Number: 10/19/2022, 12:26 PM  Clinical Narrative:                  Transition of Care (TOC) Screening Note   Patient Details  Name: Russell Grant Date of Birth: 1971-08-25   Transition of Care [facility]) CM/SW Contact:    Beverly Sessions, RN Phone Number: 10/19/2022, 12:26 PM    Transition of Care [facility]) has reviewed patient and no TOC needs have been identified at this time. We will continue to monitor patient advancement through interdisciplinary progression rounds. If new patient transition needs arise, please place a TOC consult.   TOC consulted for heart failure screen. Heart failure RN Delmar Landau has been informed.         Patient Goals and CMS Choice            Expected Discharge Plan and Services                                              Prior Living Arrangements/Services                       Activities of Daily Living Home Assistive Devices/Equipment: None ADL Screening (condition at time of admission) Patient's cognitive ability adequate to safely complete daily activities?: Yes Is the patient deaf or have difficulty hearing?: No Does the patient have difficulty seeing, even when wearing glasses/contacts?: No Does the patient have difficulty concentrating, remembering, or making decisions?: No Patient able to express need for assistance with ADLs?: Yes Does the patient have difficulty dressing or bathing?: No Independently performs ADLs?: Yes (appropriate for developmental age) Does the patient have difficulty walking or climbing stairs?: No Weakness of Legs: None Weakness of Arms/Hands: None  Permission Sought/Granted                  Emotional Assessment              Admission diagnosis:   Anasarca [R60.1] SOB (shortness of breath) [R06.02] Edema, unspecified type [R60.9] Chronic kidney disease, unspecified CKD stage [N18.9] Patient Active Problem List   Diagnosis Date Noted   SOB (shortness of breath) 10/17/2022   Leukocytosis 10/17/2022   CKD (chronic kidney disease) stage 4, GFR 15-29 ml/min ([place]) 10/17/2022   Acute renal failure superimposed on stage 4 chronic kidney disease ([place]) 09/30/2022   Type II diabetes mellitus with renal manifestations ([place]) 09/30/2022   HLD (hyperlipidemia) 09/30/2022   Hypokalemia 09/30/2022   Elevated lipase 09/30/2022   Obesity, Class III, BMI 40-49.9 (morbid obesity) ([place]) 09/30/2022   Gout 09/30/2022   Generalized weakness 08/04/2020   Dehydration 08/04/2020   New onset type 2 diabetes mellitus ([place]) 08/04/2020   SIRS due to infectious process with acute organ dysfunction ([place]) 08/04/2020   FSGS (focal segmental glomerulosclerosis), tip variant with nephrosis    Nephrotic syndrome 05/09/2020   AKI (acute kidney injury) ([place]) 05/07/2020   Anasarca 05/07/2020   CKD (chronic kidney disease) stage 3, GFR 30-59 ml/min ([place]) 03/30/2019   Hyperuricemia 03/30/2019   History of gout 03/30/2019   IFG (impaired fasting glucose) 03/30/2019   Personal history of other malignant  neoplasm of skin 09/10/2017   SCC (squamous cell carcinoma), hand 08/24/2017   Class 2 severe obesity due to excess calories with serious comorbidity and body mass index (BMI) of 38.0 to 38.9 in adult [facility]) 08/24/2017   Hypertriglyceridemia    Essential hypertension    PCP:  Sofie Hartigan, MD Pharmacy:   [id number] [place], [id number]: [phone]: [phone] [id number] [place]: [phone]: [phone]   Social Determinants of Health (SDOH) Social History: SDFultonNo Food Insecurity (10/17/2022)  Housing: Low Risk  (10/17/2022)  Transportation Needs: No Transportation Needs (10/17/2022)  Utilities: Not At Risk (10/17/2022)  Tobacco Use: Low Risk  (10/17/2022)   SDOH Interventions:     Readmission Risk Interventions    10/19/2022   12:24 PM 08/11/2020    2:13 PM 08/06/2020   10:27 AM  Readmission Risk Prevention Plan  Transportation Screening Complete Complete Complete  Social Work Consult for ReCamptownlanning/Counseling Complete    Palliative Care Screening Not Applicable  Not Applicable  Medication Review (RPress photographerComplete Complete Complete  Palliative Care Screening  Not ApNellieburgNot Applicable

## 2022-10-19 NOTE — Progress Notes (Signed)
[facility] Progress Note  Patient: Russell Grant    [id number]  DOA: 10/17/2022     Date of Service: the patient was seen and examined on 10/19/2022  Chief Complaint  Patient presents with   Edema    CKD stage 4 patient presents today with approx. 20 lb weight gain over the past week; No chest pain but is having some exertional dyspnea   Brief hospital course: Russell Grant is a 52 y.o. male with medical history significant of  FSGS, CKD-4 (on transplant list with [facility]), hypertension, hyperlipidemia, diabetes mellitus, gout,skin cancer, who presents with SOB and weight gain. Patient states that he he has worsening bilateral leg edema, weight gain of more than 15 pounds in the last week, associated with exertional shortness breath.   ED w/up: VS BP 171/69 elevated, rest within normal range WBC 15.0, slightly worsening renal function, potassium 3.   Assessment and Plan: Principal Problem:   Anasarca Active Problems:   CKD (chronic kidney disease) stage 4, GFR 15-29 ml/min (HCC)   FSGS (focal segmental glomerulosclerosis), tip variant with nephrosis   Essential hypertension   Type II diabetes mellitus with renal manifestations (HCC)   HLD (hyperlipidemia)   Hypokalemia   Gout   Leukocytosis   Obesity, Class III, BMI 40-49.9 (morbid obesity) ([place])   Anasarca due to CKD-4: His anasarca is likely due to CKD 4.  2D echo 05/08/2020 showed EF 65-70%.  ED physician consulted Dr. Joylene John of renal, recommended to start patient on Lasix 40 mg 3 times daily and metolazone 5 mg twice daily BNP 770 elevated 2/12 DC'd IV Lasix and metolazone, started torsemide 100 mg p.o. daily as per nephro     FSGS (focal segmental glomerulosclerosis), tip variant with nephrosis: pt stopped taking cyclosporine recently -f/u with [name] of renal   Essential hypertension -IV hydralazine as needed -s/p IV lasix, started torsemide 100 mg p.o. daily -Continue Coreg and amlodipine   Type II  diabetes mellitus with renal manifestations [facility]): Recent A1c 7.1, well controlled.  Patient is taking [place] and [place] -SSI   HLD (hyperlipidemia) -Fenofibrate and crestor   Hypokalemia: K 3.0--3.1 -Repleted potassium magnesium level 2.2 wnl   Gout -Continue Febuxostat Uric acid 2.9 low   Obesity, Class III, BMI 40-49.9 (morbid obesity) ([place]): Body weight 139 kg, BMI 43.97 -Encourage losing weight -Healthy diet and exercise   Leukocytosis: WBC 15.0, no source of infection identified.  No fever.  Likely reactive Procal 0.21 low -Follow-up with CBC  Anemia of chronic disease, iron profile, folic acid and [id number] within normal range. Patient will benefit from epogen. Need to f/u with Nephro for that  Vitamin D deficiency: Vt D level 5.1 very low.  started vitamin D 50,000 units p.o. weekly, follow with PCP to repeat vitamin D level after 3 to 6 months.   Body mass index is 42.17 kg/m.  Interventions:   Diet: Heart healthy diet DVT Prophylaxis: Subcutaneous Heparin    Advance goals of care discussion: Full code  Family Communication: family was present at bedside, at the time of interview.  The pt provided permission to discuss medical plan with the family. Opportunity was given to ask question and all questions were answered satisfactorily.   Disposition:  Pt is from Home, admitted with anasarca, s/p IV Lasix, transition to oral diuretics today, plan for discharge tomorrow a.m. as per nephrology. Discharge to Home, most likely tomorrow a.m.   Subjective: No significant events overnight, patient feels improvement in the lower  extremity edema and shortness of breath, patient is being walking around in the hallway.  Denied any chest pain or palpitation, no any other active issues.   Physical Exam: General: NAD, lying comfortably Appear in no distress, affect appropriate Eyes: PERRLA ENT: Oral Mucosa Clear, moist  Neck: no JVD,  Cardiovascular: S1 and S2 Present, no  Murmur,  Respiratory: good respiratory effort, Bilateral Air entry equal and Decreased, no Crackles, no wheezes Abdomen: Bowel Sound present, Soft and no tenderness, obese Skin: no rashes Extremities: 2+ Pedal edema, no calf tenderness Neurologic: without any new focal findings Gait not checked due to patient safety concerns  Vitals:   10/18/22 2011 10/19/22 0500 10/19/22 0622 10/19/22 0747  BP: (!) 146/76  (!) 147/78 (!) 156/78  Pulse: 74  73 73  Resp: 17  17 18  $ Temp: 97.9 F (36.6 C)  98 F (36.7 C) 98.1 F (36.7 C)  TempSrc: Oral     SpO2: 97%  98% 98%  Weight:  128.5 kg    Height:        Intake/Output Summary (Last 24 hours) at 10/19/2022 1249 Last data filed at 10/19/2022 1118 Gross per 24 hour  Intake 460 ml  Output 6200 ml  Net -5740 ml   Filed Weights   10/17/22 1503 10/18/22 0500 10/19/22 0500  Weight: (!) 139 kg 133.3 kg 128.5 kg    Data Reviewed: I have personally reviewed and interpreted daily labs, tele strips, imagings as discussed above. I reviewed all nursing notes, pharmacy notes, vitals, pertinent old records I have discussed plan of care as described above with RN and patient/family.  CBC: Recent Labs  Lab 10/17/22 1506 10/18/22 0235 10/19/22 0545  WBC 15.0* 14.1* 11.1*  NEUTROABS 12.7*  --   --   HGB 10.4* 10.1* 11.3*  HCT 31.2* 30.5* 34.3*  MCV 87.9 87.6 86.8  PLT 285 255 [id number]   Basic Metabolic Panel: Recent Labs  Lab 10/17/22 1504 10/17/22 1506 10/18/22 0235 10/19/22 0545  NA  --  139 141 139  K  --  3.0* 3.1* 3.6  CL  --  108 110 103  CO2  --  21* 26 30  GLUCOSE  --  176* 105* 109*  BUN  --  46* 45* 53*  CREATININE  --  3.88* 3.61* 3.51*  CALCIUM  --  7.2* 7.5* 8.6*  MG 2.2  --  2.2 2.3  PHOS  --   --  3.6 5.1*    Studies: No results found.  Scheduled Meds:  amLODipine  10 mg Oral Daily   aspirin  81 mg Oral Daily   carvedilol  6.25 mg Oral BID   febuxostat  80 mg Oral Daily   fenofibrate  160 mg Oral Daily   heparin   5,000 Units Subcutaneous Q8H   insulin aspart  0-5 Units Subcutaneous QHS   insulin aspart  0-9 Units Subcutaneous TID WC   loratadine  10 mg Oral Daily   multivitamin with minerals   Oral Daily   omega-3 acid ethyl esters  2 capsule Oral BID   rosuvastatin  20 mg Oral Daily   torsemide  100 mg Oral Daily   Continuous Infusions: PRN Meds: acetaminophen, albuterol, dextromethorphan-guaiFENesin, diphenhydrAMINE, hydrALAZINE, metolazone  Time spent: 40 minutes  Author: Val Riles. MD [facility] 10/19/2022 12:49 PM  To reach On-call, see care teams to locate the attending and reach out to them via [email or web address]. If 7PM-7AM, please contact night-coverage If you still have difficulty reaching  the attending provider, please page the [facility] (Director on Call) for [facility] on amion for assistance.

## 2022-10-19 NOTE — Progress Notes (Signed)
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  Patient presents to the emergency department leg swelling.  Patient has been admitted for Anasarca [R60.1] SOB (shortness of breath) [R06.02] Edema, unspecified type [R60.9] Chronic kidney disease, unspecified CKD stage [N18.9]   Patient is known to our practice and is followed outpatient by [name].  Patient was seen in office and it was noted that patient underwent a significant decrease in renal function, GFR 30-12.    Patient seen resting in bed, wife at bedside Alert and oriented Tolerating meals Admits to not weighing himself at home Attempts to monitor fluid intake   Objective:  Vital signs in last 24 hours:  Temp:  [97.9 F (36.6 C)-98.1 F (36.7 C)] 98.1 F (36.7 C) (02/12 0747) Pulse Rate:  [73-75] 73 (02/12 0747) Resp:  [17-18] 18 (02/12 0747) BP: (138-156)/(68-78) 156/78 (02/12 0747) SpO2:  [97 %-98 %] 98 % (02/12 0747) Weight:  [128.5 kg] 128.5 kg (02/12 0500)  Weight change: -10.5 kg Filed Weights   10/17/22 1503 10/18/22 0500 10/19/22 0500  Weight: (!) 139 kg 133.3 kg 128.5 kg    Intake/Output: I/O last 3 completed shifts: In: 50 [P.O.:460] Out: [id number] [Urine:11825]   Intake/Output this shift:  Total I/O In: -  Out: 1500 [Urine:1500]  Physical Exam: General: NAD  Head: Normocephalic, atraumatic. Moist oral mucosal membranes  Eyes: Anicteric  Lungs:  Clear to auscultation, normal effort, room air  Heart: Regular rate and rhythm  Abdomen:  Soft, nontender  Extremities: Trace peripheral edema.  Neurologic: Nonfocal, moving all four extremities  Skin: No lesions  Access: None    Basic Metabolic Panel: Recent Labs  Lab 10/17/22 1504 10/17/22 1506 10/18/22 0235 10/19/22 0545  NA  --  139 141 139  K  --  3.0* 3.1*  3.6  CL  --  108 110 103  CO2  --  21* 26 30  GLUCOSE  --  176* 105* 109*  BUN  --  46* 45* 53*  CREATININE  --  3.88* 3.61* 3.51*  CALCIUM  --  7.2* 7.5* 8.6*  MG 2.2  --  2.2 2.3  PHOS  --   --  3.6 5.1*     Liver Function Tests: No results for input(s): "AST", "ALT", "ALKPHOS", "BILITOT", "PROT", "ALBUMIN" in the last 168 hours.  No results for input(s): "LIPASE", "AMYLASE" in the last 168 hours.  No results for input(s): "AMMONIA" in the last 168 hours.  CBC: Recent Labs  Lab 10/17/22 1506 10/18/22 0235 10/19/22 0545  WBC 15.0* 14.1* 11.1*  NEUTROABS 12.7*  --   --   HGB 10.4* 10.1* 11.3*  HCT 31.2* 30.5* 34.3*  MCV 87.9 87.6 86.8  PLT 285 255 277     Cardiac Enzymes: No results for input(s): "CKTOTAL", "CKMB", "CKMBINDEX", "TROPONINI" in the last 168 hours.  BNP: Invalid input(s): "POCBNP"  CBG: Recent Labs  Lab 10/18/22 1225 10/18/22 1651 10/18/22 2106 10/19/22 0749 10/19/22 1115  GLUCAP 107* 97 126* 102* 119*     Microbiology: Results for orders placed or performed during the hospital encounter of 08/04/20  Resp Panel by RT-PCR (Flu A&B, Covid) Nasopharyngeal Swab     Status: None   Collection Time: 08/04/20  5:07 AM   Specimen: Nasopharyngeal Swab; Nasopharyngeal(NP) swabs in vial transport medium  Result Value Ref Range Status  SARS Coronavirus 2 by RT PCR NEGATIVE NEGATIVE Final    Comment: (NOTE) SARS-CoV-2 target nucleic acids are NOT DETECTED.  The SARS-CoV-2 RNA is generally detectable in upper respiratory specimens during the acute phase of infection. The lowest concentration of SARS-CoV-2 viral copies this assay can detect is 138 copies/mL. A negative result does not preclude SARS-Cov-2 infection and should not be used as the sole basis for treatment or other patient management decisions. A negative result may occur with  improper specimen collection/handling, submission of specimen other than nasopharyngeal swab, presence of viral  mutation(s) within the areas targeted by this assay, and inadequate number of viral copies(<138 copies/mL). A negative result must be combined with clinical observations, patient history, and epidemiological information. The expected result is Negative.  Fact Sheet for Patients:  EntrepreneurPulse.com.au  Fact Sheet for Healthcare Providers:  IncredibleEmployment.be  This test is no t yet approved or cleared by the [place] FDA and  has been authorized for detection and/or diagnosis of SARS-CoV-2 by FDA under an Emergency Use Authorization (EUA). This EUA will remain  in effect (meaning this test can be used) for the duration of the COVID-19 declaration under Section 564(b)(1) of the Act, 21 U.S.C.section 360bbb-3(b)(1), unless the authorization is terminated  or revoked sooner.       Influenza A by PCR NEGATIVE NEGATIVE Final   Influenza B by PCR NEGATIVE NEGATIVE Final    Comment: (NOTE) The Xpert Xpress SARS-CoV-2/FLU/RSV plus assay is intended as an aid in the diagnosis of influenza from Nasopharyngeal swab specimens and should not be used as a sole basis for treatment. Nasal washings and aspirates are unacceptable for Xpert Xpress SARS-CoV-2/FLU/RSV testing.  Fact Sheet for Patients: EntrepreneurPulse.com.au  Fact Sheet for Healthcare Providers: IncredibleEmployment.be  This test is not yet approved or cleared by the [place] FDA and has been authorized for detection and/or diagnosis of SARS-CoV-2 by FDA under an Emergency Use Authorization (EUA). This EUA will remain in effect (meaning this test can be used) for the duration of the COVID-19 declaration under Section 564(b)(1) of the Act, 21 U.S.C. section 360bbb-3(b)(1), unless the authorization is terminated or revoked.  Performed at [facility], [address] [place], [place] [address]   Culture, blood (routine x 2)      Status: None   Collection Time: 08/04/20  7:40 AM   Specimen: BLOOD  Result Value Ref Range Status   Specimen Description   Final    BLOOD LEFT ANTECUBITAL Performed at [facility], [address], [place], [place] [address]    Special Requests   Final    BOTTLES DRAWN AEROBIC AND ANAEROBIC Blood Culture results may not be optimal due to an excessive volume of blood received in culture bottles Performed at [facility], [address], [place], [place] [address]    Culture   Final    NO GROWTH 5 DAYS Performed at [facility], [place] [address] [place], [place] [address]    Report Status 08/09/2020 FINAL  Final  Culture, blood (routine x 2)     Status: None   Collection Time: 08/04/20  2:40 PM   Specimen: BLOOD  Result Value Ref Range Status   Specimen Description BLOOD RIGHT ANTECUBITAL  Final   Special Requests   Final    BOTTLES DRAWN AEROBIC AND ANAEROBIC Blood Culture results may not be optimal due to an inadequate volume of blood received in culture bottles   Culture   Final  NO GROWTH 5 DAYS Performed at [facility], [address] [place], [place] [address]    Report Status 08/09/2020 FINAL  Final    Coagulation Studies: No results for input(s): "LABPROT", "INR" in the last 72 hours.  Urinalysis: No results for input(s): "COLORURINE", "LABSPEC", "PHURINE", "GLUCOSEU", "HGBUR", "BILIRUBINUR", "KETONESUR", "PROTEINUR", "UROBILINOGEN", "NITRITE", "LEUKOCYTESUR" in the last 72 hours.  Invalid input(s): "APPERANCEUR"     Imaging: ECHOCARDIOGRAM COMPLETE  Result Date: 10/18/2022    ECHOCARDIOGRAM REPORT   Patient Name:   JAYLIN BLANE Date of Exam: 10/18/2022 Medical Rec #:  [id number]       Height:       70.0 in Accession #:    [id number]      Weight:       293.9 lb Date of Birth:  08/11/1971      BSA:          2.458 m Patient Age:    38 years        BP:           140/62 mmHg Patient Gender: M               HR:           71 bpm.  Exam Location:  [facility] Procedure: 2D Echo, Color Doppler and Cardiac Doppler Indications:     Cardiomyopathy  History:         Patient has prior history of Echocardiogram examinations. Risk                  Factors:Hypertension and HTD, CKD IV, Morbid Obesity.  Sonographer:     L. Thornton-Maynard Referring Phys:  [id number] Val Riles Diagnosing Phys: Ida Rogue MD IMPRESSIONS  1. Left ventricular ejection fraction, by estimation, is 60 to 65%. The left ventricle has normal function. The left ventricle has no regional wall motion abnormalities. There is mild left ventricular hypertrophy. Left ventricular diastolic parameters were normal.  2. Right ventricular systolic function is normal. The right ventricular size is normal. Tricuspid regurgitation signal is inadequate for assessing PA pressure.  3. The mitral valve is normal in structure. Mild mitral valve regurgitation. No evidence of mitral stenosis.  4. The aortic valve is normal in structure. Aortic valve regurgitation is not visualized. Aortic valve sclerosis is present, with no evidence of aortic valve stenosis.  5. The inferior vena cava is normal in size with greater than 50% respiratory variability, suggesting right atrial pressure of 3 mmHg. FINDINGSh  Left Ventricle: Left ventricular ejection fraction, by estimation, is 60 to 65%. The left ventricle has normal function. The left ventricle has no regional wall motion abnormalities. The left ventricular internal cavity size was normal in size. There is  mild left ventricular hypertrophy. Left ventricular diastolic parameters were normal. Right Ventricle: The right ventricular size is normal. No increase in right ventricular wall thickness. Right ventricular systolic function is normal. Tricuspid regurgitation signal is inadequate for assessing PA pressure. Left Atrium: Left atrial size was normal in size. Right Atrium: Right atrial size was normal in size. Pericardium: There is no evidence of pericardial  effusion. Mitral Valve: The mitral valve is normal in structure. Mild mitral valve regurgitation. No evidence of mitral valve stenosis. Tricuspid Valve: The tricuspid valve is normal in structure. Tricuspid valve regurgitation is not demonstrated. No evidence of tricuspid stenosis. Aortic Valve: The aortic valve is normal in structure. Aortic valve regurgitation is not visualized. Aortic valve sclerosis is present, with no evidence of aortic valve  stenosis. Aortic valve mean gradient measures 4.5 mmHg. Aortic valve peak gradient measures 8.7 mmHg. Aortic valve area, by VTI measures 1.94 cm. Pulmonic Valve: The pulmonic valve was normal in structure. Pulmonic valve regurgitation is not visualized. No evidence of pulmonic stenosis. Aorta: The aortic root is normal in size and structure. Venous: The inferior vena cava is normal in size with greater than 50% respiratory variability, suggesting right atrial pressure of 3 mmHg. IAS/Shunts: No atrial level shunt detected by color flow Doppler.  LEFT VENTRICLE PLAX 2D LVIDd:         5.30 cm      Diastology LVIDs:         3.10 cm      LV e' medial:    7.83 cm/s LV PW:         1.30 cm      LV E/e' medial:  14.8 LV IVS:        1.30 cm      LV e' lateral:   10.10 cm/s LVOT diam:     2.00 cm      LV E/e' lateral: 11.5 LV SV:         54 LV SV Index:   22 LVOT Area:     3.14 cm  LV Volumes (MOD) LV vol d, MOD A2C: 75.2 ml LV vol d, MOD A4C: 121.0 ml LV vol s, MOD A2C: 40.4 ml LV vol s, MOD A4C: 36.3 ml LV SV MOD A2C:     34.8 ml LV SV MOD A4C:     121.0 ml LV SV MOD BP:      61.6 ml RIGHT VENTRICLE RV Basal diam:  4.30 cm RV S prime:     14.00 cm/s TAPSE (M-mode): 2.5 cm LEFT ATRIUM             Index        RIGHT ATRIUM           Index LA diam:        4.10 cm 1.67 cm/m   RA Area:     15.70 cm LA Vol (A2C):   64.7 ml 26.32 ml/m  RA Volume:   40.90 ml  16.64 ml/m LA Vol (A4C):   70.9 ml 28.84 ml/m LA Biplane Vol: 70.7 ml 28.76 ml/m  AORTIC VALVE                    PULMONIC  VALVE AV Area (Vmax):    1.98 cm     PV Vmax:       1.23 m/s AV Area (Vmean):   1.97 cm     PV Peak grad:  6.0 mmHg AV Area (VTI):     1.94 cm AV Vmax:           147.50 cm/s AV Vmean:          96.850 cm/s AV VTI:            0.278 m AV Peak Grad:      8.7 mmHg AV Mean Grad:      4.5 mmHg LVOT Vmax:         92.80 cm/s LVOT Vmean:        60.800 cm/s LVOT VTI:          0.172 m LVOT/AV VTI ratio: 0.62  AORTA Ao Root diam: 3.30 cm Ao Asc diam:  3.00 cm MITRAL VALVE MV Area (PHT): 4.31 cm     SHUNTS MV Decel Time: 176 msec  Systemic VTI:  0.17 m MV E velocity: 116.00 cm/s  Systemic Diam: 2.00 cm MV A velocity: 86.50 cm/s MV E/A ratio:  1.34 Ida Rogue MD Electronically signed by Ida Rogue MD Signature Date/Time: 10/18/2022/1:51:07 PM    Final    DG Chest 2 View  Result Date: 10/17/2022 CLINICAL DATA:  Edema EXAM: CHEST - 2 VIEW COMPARISON:  08/04/2020 FINDINGS: The heart size and mediastinal contours are within normal limits. No focal airspace consolidation, pleural effusion, or pneumothorax. The visualized skeletal structures are unremarkable. IMPRESSION: No active cardiopulmonary disease. Electronically Signed   By: Davina Poke D.O.   On: 10/17/2022 15:42     Medications:      amLODipine  10 mg Oral Daily   aspirin  81 mg Oral Daily   carvedilol  6.25 mg Oral BID   febuxostat  80 mg Oral Daily   fenofibrate  160 mg Oral Daily   heparin  5,000 Units Subcutaneous Q8H   insulin aspart  0-5 Units Subcutaneous QHS   insulin aspart  0-9 Units Subcutaneous TID WC   loratadine  10 mg Oral Daily   multivitamin with minerals   Oral Daily   omega-3 acid ethyl esters  2 capsule Oral BID   rosuvastatin  20 mg Oral Daily   torsemide  100 mg Oral Daily   acetaminophen, albuterol, dextromethorphan-guaiFENesin, diphenhydrAMINE, hydrALAZINE, metolazone  Assessment/ Plan:  [name] is a 52 y.o.  male with past medical conditions including gout, diabetes, hyperlipidemia,  hypertension, and chronic kidney disease stage IV with FSGS.  Patient presents to the emergency department at the advice of his nephrologist due to abnormal labs.  Patient has been admitted for Anasarca [R60.1] SOB (shortness of breath) [R06.02] Edema, unspecified type [R60.9] Chronic kidney disease, unspecified CKD stage [N18.9]   Acute Kidney Injury on chronic kidney disease stage IV secondary to FSGS with tip variant.  Baseline creatinine 3.03 and GFR of 24 on 07/23/2022.   Creatinine slowly improving with adequate urine output, 7L in previous 24 hours. No acute need for dialysis at this time. Will monitor renal function with transition to oral diuretics.   Lab Results  Component Value Date   CREATININE 3.51 (H) 10/19/2022   CREATININE 3.61 (H) 10/18/2022   CREATININE 3.88 (H) 10/17/2022    Intake/Output Summary (Last 24 hours) at 10/19/2022 1145 Last data filed at 10/19/2022 1118 Gross per 24 hour  Intake 460 ml  Output 7200 ml  Net -6740 ml    2. Anemia of chronic kidney disease  Lab Results  Component Value Date   HGB 11.3 (L) 10/19/2022   Hemoglobin within optimal range.   3.  Hypertension with chronic kidney disease.  Currently receiving amlodipine and carvedilol.  Blood pressure 156/78, acceptable for this patient  4. Diabetes mellitus type II with chronic kidney disease/renal manifestations: noninsulin dependent. Home regimen includes farxiga. Most recent hemoglobin A1c is 7.1 on 08/06/22.   Primary team to manage sliding scale insulin.  5. Anasarca, prescribed IV Furosemide and metolazone with adequate response. Will transition to oral torsemide 123m daily with PRN Metolazone if weight gain 2lbs or more.    LOS: 2 SMantee2/12/202411:45 AM

## 2022-10-20 DIAGNOSIS — R601 Generalized edema: Secondary | ICD-10-CM | POA: Diagnosis not present

## 2022-10-20 LAB — BASIC METABOLIC PANEL
Anion gap: 10 (ref 5–15)
BUN: 59 mg/dL — ABNORMAL HIGH (ref 6–20)
CO2: 30 mmol/L (ref 22–32)
Calcium: 9 mg/dL (ref 8.9–10.3)
Chloride: 98 mmol/L (ref 98–111)
Creatinine, Ser: 3.71 mg/dL — ABNORMAL HIGH (ref 0.61–1.24)
GFR, Estimated: 19 mL/min — ABNORMAL LOW (ref >60)
Glucose, Bld: 102 mg/dL — ABNORMAL HIGH (ref 70–99)
Potassium: 2.9 mmol/L — ABNORMAL LOW (ref 3.5–5.1)
Sodium: 138 mmol/L (ref 135–145)

## 2022-10-20 LAB — GLUCOSE, CAPILLARY
Glucose-Capillary: 102 mg/dL — ABNORMAL HIGH (ref 70–99)
Glucose-Capillary: 137 mg/dL — ABNORMAL HIGH (ref 70–99)

## 2022-10-20 LAB — PHOSPHORUS: Phosphorus: 5.4 mg/dL — ABNORMAL HIGH (ref 2.5–4.6)

## 2022-10-20 LAB — CBC
HCT: 34.7 % — ABNORMAL LOW (ref 39.0–52.0)
Hemoglobin: 11.8 g/dL — ABNORMAL LOW (ref 13.0–17.0)
MCH: 29.2 pg (ref 26.0–34.0)
MCHC: 34 g/dL (ref 30.0–36.0)
MCV: 85.9 fL (ref 80.0–100.0)
Platelets: 288 10*3/uL (ref 150–400)
RBC: 4.04 MIL/uL — ABNORMAL LOW (ref 4.22–5.81)
RDW: 14.1 % (ref 11.5–15.5)
WBC: 12.6 10*3/uL — ABNORMAL HIGH (ref 4.0–10.5)
nRBC: 0 % (ref 0.0–0.2)

## 2022-10-20 LAB — MAGNESIUM: Magnesium: 2.2 mg/dL (ref 1.7–2.4)

## 2022-10-20 MED ORDER — POTASSIUM CHLORIDE CRYS ER 20 MEQ PO TBCR
40.0000 meq | EXTENDED_RELEASE_TABLET | Freq: Three times a day (TID) | ORAL | Status: DC
  Administered 2022-10-20: 40 meq via ORAL
  Filled 2022-10-20: qty 2

## 2022-10-20 MED ORDER — POTASSIUM CHLORIDE CRYS ER 20 MEQ PO TBCR: 40.0000 meq | EXTENDED_RELEASE_TABLET | Freq: Two times a day (BID) | ORAL | 2 refills | Status: AC

## 2022-10-20 MED ORDER — METOLAZONE 2.5 MG PO TABS: 2.5000 mg | ORAL_TABLET | Freq: Every day | ORAL | 0 refills | Status: DC | PRN

## 2022-10-20 MED ORDER — POTASSIUM CHLORIDE CRYS ER 20 MEQ PO TBCR
40.0000 meq | EXTENDED_RELEASE_TABLET | ORAL | Status: DC
  Administered 2022-10-20: 40 meq via ORAL
  Filled 2022-10-20: qty 2

## 2022-10-20 MED ORDER — HYDROXYZINE HCL 25 MG PO TABS: 25.0000 mg | ORAL_TABLET | Freq: Three times a day (TID) | ORAL | 0 refills | Status: AC | PRN

## 2022-10-20 MED ORDER — VITAMIN D (ERGOCALCIFEROL) 1.25 MG (50000 UNIT) PO CAPS: 50000.0000 [IU] | ORAL_CAPSULE | ORAL | 0 refills | Status: AC

## 2022-10-20 NOTE — Plan of Care (Signed)

## 2022-10-20 NOTE — Progress Notes (Signed)
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  Patient presents to the emergency department leg swelling.  Patient has been admitted for Anasarca [R60.1] SOB (shortness of breath) [R06.02] Edema, unspecified type [R60.9] Chronic kidney disease, unspecified CKD stage [N18.9]   Patient is known to our practice and is followed outpatient by [name].  Patient was seen in office and it was noted that patient underwent a significant decrease in renal function, GFR 30-12.    Patient seen resting in bed, wife at bedside Remains on room air No lower extremity edema Appetite appropriate States he is ready to discharge later today   Objective:  Vital signs in last 24 hours:  Temp:  [98 F (36.7 C)-98.5 F (36.9 C)] 98 F (36.7 C) (02/13 0745) Pulse Rate:  [72-79] 79 (02/13 0745) Resp:  [18] 18 (02/13 0745) BP: (134-163)/(70-83) 152/80 (02/13 0745) SpO2:  [94 %-98 %] 94 % (02/13 0745)  Weight change:  Filed Weights   10/17/22 1503 10/18/22 0500 10/19/22 0500  Weight: (!) 139 kg 133.3 kg 128.5 kg    Intake/Output: I/O last 3 completed shifts: In: 45 [P.O.:720] Out: 8200 [Urine:8200]   Intake/Output this shift:  Total I/O In: -  Out: 1650 [Urine:1650]  Physical Exam: General: NAD  Head: Normocephalic, atraumatic. Moist oral mucosal membranes  Eyes: Anicteric  Lungs:  Clear to auscultation, normal effort, room air  Heart: Regular rate and rhythm  Abdomen:  Soft, nontender  Extremities: No peripheral edema.  Neurologic: Nonfocal, moving all four extremities  Skin: No lesions  Access: None    Basic Metabolic Panel: Recent Labs  Lab 10/17/22 1504 10/17/22 1506 10/17/22 1506 10/18/22 0235 10/19/22 0545 10/20/22 0505  NA  --  139  --  141 139 138  K  --  3.0*  --  3.1* 3.6 2.9*  CL  --   108  --  110 103 98  CO2  --  21*  --  26 30 30  $ GLUCOSE  --  176*  --  105* 109* 102*  BUN  --  46*  --  45* 53* 59*  CREATININE  --  3.88*  --  3.61* 3.51* 3.71*  CALCIUM  --  7.2*   < > 7.5* 8.6* 9.0  MG 2.2  --   --  2.2 2.3 2.2  PHOS  --   --   --  3.6 5.1* 5.4*   < > = values in this interval not displayed.     Liver Function Tests: No results for input(s): "AST", "ALT", "ALKPHOS", "BILITOT", "PROT", "ALBUMIN" in the last 168 hours.  No results for input(s): "LIPASE", "AMYLASE" in the last 168 hours.  No results for input(s): "AMMONIA" in the last 168 hours.  CBC: Recent Labs  Lab 10/17/22 1506 10/18/22 0235 10/19/22 0545 10/20/22 0505  WBC 15.0* 14.1* 11.1* 12.6*  NEUTROABS 12.7*  --   --   --   HGB 10.4* 10.1* 11.3* 11.8*  HCT 31.2* 30.5* 34.3* 34.7*  MCV 87.9 87.6 86.8 85.9  PLT 285 255 277 288     Cardiac Enzymes: No results for input(s): "CKTOTAL", "CKMB", "CKMBINDEX", "TROPONINI" in the last 168 hours.  BNP: Invalid input(s): "POCBNP"  CBG: Recent Labs  Lab 10/19/22 1115 10/19/22 1625 10/19/22 2038 10/20/22 0747 10/20/22 1128  GLUCAP 119* 114* 127* 102* 137*  Microbiology: Results for orders placed or performed during the hospital encounter of 08/04/20  Resp Panel by RT-PCR (Flu A&B, Covid) Nasopharyngeal Swab     Status: None   Collection Time: 08/04/20  5:07 AM   Specimen: Nasopharyngeal Swab; Nasopharyngeal(NP) swabs in vial transport medium  Result Value Ref Range Status   SARS Coronavirus 2 by RT PCR NEGATIVE NEGATIVE Final    Comment: (NOTE) SARS-CoV-2 target nucleic acids are NOT DETECTED.  The SARS-CoV-2 RNA is generally detectable in upper respiratory specimens during the acute phase of infection. The lowest concentration of SARS-CoV-2 viral copies this assay can detect is 138 copies/mL. A negative result does not preclude SARS-Cov-2 infection and should not be used as the sole basis for treatment or other patient management  decisions. A negative result may occur with  improper specimen collection/handling, submission of specimen other than nasopharyngeal swab, presence of viral mutation(s) within the areas targeted by this assay, and inadequate number of viral copies(<138 copies/mL). A negative result must be combined with clinical observations, patient history, and epidemiological information. The expected result is Negative.  Fact Sheet for Patients:  EntrepreneurPulse.com.au  Fact Sheet for Healthcare Providers:  IncredibleEmployment.be  This test is no t yet approved or cleared by the [place] FDA and  has been authorized for detection and/or diagnosis of SARS-CoV-2 by FDA under an Emergency Use Authorization (EUA). This EUA will remain  in effect (meaning this test can be used) for the duration of the COVID-19 declaration under Section 564(b)(1) of the Act, 21 U.S.C.section 360bbb-3(b)(1), unless the authorization is terminated  or revoked sooner.       Influenza A by PCR NEGATIVE NEGATIVE Final   Influenza B by PCR NEGATIVE NEGATIVE Final    Comment: (NOTE) The Xpert Xpress SARS-CoV-2/FLU/RSV plus assay is intended as an aid in the diagnosis of influenza from Nasopharyngeal swab specimens and should not be used as a sole basis for treatment. Nasal washings and aspirates are unacceptable for Xpert Xpress SARS-CoV-2/FLU/RSV testing.  Fact Sheet for Patients: EntrepreneurPulse.com.au  Fact Sheet for Healthcare Providers: IncredibleEmployment.be  This test is not yet approved or cleared by the [place] FDA and has been authorized for detection and/or diagnosis of SARS-CoV-2 by FDA under an Emergency Use Authorization (EUA). This EUA will remain in effect (meaning this test can be used) for the duration of the COVID-19 declaration under Section 564(b)(1) of the Act, 21 U.S.C. section 360bbb-3(b)(1), unless the  authorization is terminated or revoked.  Performed at [facility], [address] [place], [place] [address]   Culture, blood (routine x 2)     Status: None   Collection Time: 08/04/20  7:40 AM   Specimen: BLOOD  Result Value Ref Range Status   Specimen Description   Final    BLOOD LEFT ANTECUBITAL Performed at [facility], [address], [place], [place] [address]    Special Requests   Final    BOTTLES DRAWN AEROBIC AND ANAEROBIC Blood Culture results may not be optimal due to an excessive volume of blood received in culture bottles Performed at [facility], [address] [place], [place] [address]    Culture   Final    NO GROWTH 5 DAYS Performed at [facility], [place] [address], [place], [place] [address]    Report Status 08/09/2020 FINAL  Final  Culture, blood (routine x 2)     Status: None   Collection Time: 08/04/20  2:40 PM   Specimen: BLOOD  Result Value Ref Range Status   Specimen Description BLOOD RIGHT ANTECUBITAL  Final   Special Requests   Final    BOTTLES DRAWN AEROBIC AND ANAEROBIC Blood Culture results may not be optimal due to an inadequate volume of blood received in culture bottles   Culture   Final    NO GROWTH 5 DAYS Performed at [facility], [address], [place], [place] [address]    Report Status 08/09/2020 FINAL  Final    Coagulation Studies: No results for input(s): "LABPROT", "INR" in the last 72 hours.  Urinalysis: No results for input(s): "COLORURINE", "LABSPEC", "PHURINE", "GLUCOSEU", "HGBUR", "BILIRUBINUR", "KETONESUR", "PROTEINUR", "UROBILINOGEN", "NITRITE", "LEUKOCYTESUR" in the last 72 hours.  Invalid input(s): "APPERANCEUR"     Imaging: No results found.   Medications:      amLODipine  10 mg Oral Daily   aspirin  81 mg Oral Daily   carvedilol  6.25 mg Oral BID   febuxostat  80 mg Oral Daily   fenofibrate  160 mg Oral Daily   heparin  5,000 Units Subcutaneous Q8H    insulin aspart  0-5 Units Subcutaneous QHS   insulin aspart  0-9 Units Subcutaneous TID WC   loratadine  10 mg Oral Daily   multivitamin with minerals   Oral Daily   omega-3 acid ethyl esters  2 capsule Oral BID   potassium chloride  40 mEq Oral Q4H   rosuvastatin  20 mg Oral Daily   senna-docusate  1 tablet Oral Daily   torsemide  100 mg Oral Daily   Vitamin D (Ergocalciferol)  50,000 Units Oral Q7 days   acetaminophen, albuterol, bisacodyl, dextromethorphan-guaiFENesin, diphenhydrAMINE, hydrALAZINE, metolazone  Assessment/ Plan:  Russell Grant is a 52 y.o.  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  Patient presents to the emergency department at the advice of his nephrologist due to abnormal labs.  Patient has been admitted for Anasarca [R60.1] SOB (shortness of breath) [R06.02] Edema, unspecified type [R60.9] Chronic kidney disease, unspecified CKD stage [N18.9]   Acute Kidney Injury on chronic kidney disease stage IV secondary to FSGS with tip variant.  Baseline creatinine 3.03 and GFR of 24 on 07/23/2022.   Creatinine slightly elevated today, but acceptable for this patient. Continues to have adequate urine output. Cleared to discharge later today from renal stance to follow up in office tomorrow with labs.   Lab Results  Component Value Date   CREATININE 3.71 (H) 10/20/2022   CREATININE 3.51 (H) 10/19/2022   CREATININE 3.61 (H) 10/18/2022    Intake/Output Summary (Last 24 hours) at 10/20/2022 1229 Last data filed at 10/20/2022 1111 Gross per 24 hour  Intake 440 ml  Output 5350 ml  Net -4910 ml    2. Anemia of chronic kidney disease  Lab Results  Component Value Date   HGB 11.8 (L) 10/20/2022   Hgb stable   3.  Hypertension with chronic kidney disease.  Currently receiving amlodipine and carvedilol.  Blood pressure 152/80.    4. Diabetes mellitus type II with  chronic kidney disease/renal manifestations: noninsulin dependent. Home regimen includes farxiga. Most recent hemoglobin A1c is 7.1 on 08/06/22.   Well controlled, primary team to manage sliding scale insulin.  5. Anasarca, prescribed IV Furosemide and metolazone with adequate response. Prescribed torsemide 168m daily with PRN Metolazone if weight gain 2lbs or more.   6. Hypokalemia, Potassium 2.9. Primary team has ordered oral supplementation.  LOS: 3 Lanitra Battaglini 2/13/202412:29 PM

## 2022-10-20 NOTE — Progress Notes (Signed)
Mobility Specialist - Progress Note   10/20/22 0927  Mobility  Activity Ambulated independently in hallway  Level of Assistance Independent  Assistive Device None  Distance Ambulated (ft) 640 ft  Activity Response Tolerated well  $Mobility charge 1 Mobility   Pt amb 4 laps around NS, tolerated well. Pt returned to room, left amb with needs within reach.   Candie Mile Mobility Specialist 10/20/22 9:33 AM

## 2022-10-20 NOTE — Discharge Summary (Signed)
[facility] Discharge Summary   Patient: Russell Grant [id number]  PCP: Sofie Hartigan, MD  Date of admission: 10/17/2022   Date of discharge:  10/20/2022     Discharge Diagnoses:  Principal Problem:   Anasarca Active Problems:   CKD (chronic kidney disease) stage 4, GFR 15-29 ml/min (HCC)   FSGS (focal segmental glomerulosclerosis), tip variant with nephrosis   Essential hypertension   Type II diabetes mellitus with renal manifestations ([place])   HLD (hyperlipidemia)   Hypokalemia   Gout   Leukocytosis   Obesity, Class III, BMI 40-49.9 (morbid obesity) ([place])   Admitted From: Home Disposition:  Home   Recommendations for Outpatient Follow-up:  Follow-up with PCP in 1 week Follow-up with nephrology as per schedule, patient has an appointment on 2/14, repeat BMP after 1 week Follow up LABS/TEST:  BMP in 1 wk   Diet recommendation: Renal diet  Activity: The patient is advised to gradually reintroduce usual activities, as tolerated  Discharge Condition: stable  Code Status: Full code   History of present illness: As per the H and P dictated on admission  Hospital Course:  Russell Grant is a 52 y.o. male with medical history significant of  FSGS, CKD-4 (on transplant list with [facility]), hypertension, hyperlipidemia, diabetes mellitus, gout,skin cancer, who presents with SOB and weight gain. Patient states that he he has worsening bilateral leg edema, weight gain of more than 15 pounds in the last week, associated with exertional shortness breath.  ED w/up: VS BP 171/69 elevated, rest within normal range WBC 15.0, slightly worsening renal function, potassium 3.   Assessment and Plan:  # Anasarca due to CKD-4: His anasarca is likely due to CKD 4.  2D echo 05/08/2020 showed EF 65-70%.  ED physician consulted Dr. Joylene John of renal, recommended to start patient on Lasix 40 mg 3 times daily and metolazone 5 mg twice daily. BNP 770 elevated 2/12 DC'd IV Lasix and  metolazone, started torsemide 100 mg p.o. daily and metolazone as needed.  Nephrology cleared for discharge, edema improved. # Hypokalemia due to diuresis, potassium repleted.  Patient was prescribed potassium chloride 40 mEq/day.  Repeat BMP after 1 week and follow with nephrology as an outpatient. # FSGS (focal segmental glomerulosclerosis), tip variant with nephrosis: pt stopped taking cyclosporine recently. f/u with [name] of renal # Essential hypertension, s/p IV Lasix, started torsemide 100 mg p.o. daily. Continued Coreg and amlodipine.  BP remained stable, advised to monitor at home and follow with PCP and nephrology for further management as an outpatient. # Type II diabetes mellitus with renal manifestations [facility]): Recent A1c 7.1, well controlled.  Patient is taking [place] and [place], which was resumed on discharge.  Patient was advised to monitor FSBG, continue diabetic diet. # HLD (hyperlipidemia) resumed Fenofibrate and crestor # Gout, Continue Febuxostat. Uric acid 2.9 low  # Obesity, Class III, BMI 40-49.9 (morbid obesity) ([place]): Body weight 139 kg, BMI 43.97 Encourage losing weight and Healthy diet and exercise # Leukocytosis: WBC 15.0, no source of infection identified.  No fever.  Likely reactive Procal 0.21 low, wbc 12.6 today, no signs of infection.  Monitored off antibiotics.  Recommended to follow with PCP.  # Anemia of chronic disease, iron profile, folic acid and [id number] within normal range. Patient will benefit from epogen. Need to f/u with Nephro for that # Vitamin D deficiency: Vt D level 5.1 very low.  started vitamin D 50,000 units p.o. weekly, follow with PCP to repeat vitamin D level  after 3 to 6 months. # Itching on bilateral lower extremities, could be due to dryness.  Patient was advised to use moisturizer and prescribed Atarax as needed for itching. # Morbid obesity, body mass index is 40.65 kg/m.  Nutrition Interventions:   Patient was ambulatory without any  assistance. On the day of the discharge the patient's vitals were stable, and no other acute medical condition were reported by patient. the patient was felt safe to be discharge at Home.  Consultants: Nephrology  Procedures: None  Discharge Exam: General: Appear in no distress, no Rash; Oral Mucosa Clear, moist. Cardiovascular: S1 and S2 Present, no Murmur, Respiratory: normal respiratory effort, Bilateral Air entry present and no Crackles, no wheezes Abdomen: Bowel Sound present, Soft and no tenderness, no hernia Extremities: mild Pedal edema, no calf tenderness Neurology: alert and oriented to time, place, and person affect appropriate.  Filed Weights   10/17/22 1503 10/18/22 0500 10/19/22 0500  Weight: (!) 139 kg 133.3 kg 128.5 kg   Vitals:   10/20/22 0306 10/20/22 0745  BP: 134/70 (!) 152/80  Pulse: 79 79  Resp: 18 18  Temp: 98.5 F (36.9 C) 98 F (36.7 C)  SpO2: 97% 94%    DISCHARGE MEDICATION: Allergies as of 10/20/2022       Reactions   Hydrochlorothiazide Rash   Tacrolimus    Cefdinir Rash        Medication List     TAKE these medications    amLODipine 10 MG tablet Commonly known as: NORVASC Take 1 tablet (10 mg total) by mouth daily.   aspirin 81 MG chewable tablet Chew 1 tablet (81 mg total) by mouth daily.   Calcium 600-200 MG-UNIT tablet Take 1 tablet by mouth daily. Can take any over-the-counter supplement.   carvedilol 6.25 MG tablet Commonly known as: COREG Take 6.25 mg by mouth 2 (two) times daily.   dapagliflozin propanediol 10 MG Tabs tablet Commonly known as: FARXIGA Take 10 mg by mouth daily.   Febuxostat 80 MG Tabs Take 80 mg by mouth daily.   fenofibrate 145 MG tablet Commonly known as: TRICOR Take 145 mg by mouth daily.   hydrOXYzine 25 MG tablet Commonly known as: ATARAX Take 1 tablet (25 mg total) by mouth 3 (three) times daily as needed for itching.   loratadine 10 MG tablet Commonly known as: CLARITIN Take 10 mg  by mouth daily.   metolazone 2.5 MG tablet Commonly known as: ZAROXOLYN Take 1 tablet (2.5 mg total) by mouth daily as needed (Weight gain >2lbs).   MULTIVITAMIN ADULTS PO Take 1 tablet by mouth daily.   omega-3 acid ethyl esters 1 g capsule Commonly known as: LOVAZA Take 2 capsules by mouth 2 (two) times daily.   potassium chloride SA 20 MEQ tablet Commonly known as: KLOR-CON M Take 2 tablets (40 mEq total) by mouth 2 (two) times daily.   rosuvastatin 20 MG tablet Commonly known as: CRESTOR Take 1 tablet (20 mg total) by mouth daily.   senna-docusate 8.6-50 MG tablet Commonly known as: Senokot-S Take 1 tablet by mouth daily.   tirzepatide 7.5 MG/0.5ML Pen Commonly known as: MOUNJARO Inject 7.5 mg into the skin once a week.   torsemide 100 MG tablet Commonly known as: DEMADEX Take 100 mg by mouth daily.   Vitamin D (Ergocalciferol) 1.25 MG (50000 UNIT) Caps capsule Commonly known as: DRISDOL Take 1 capsule (50,000 Units total) by mouth every 7 (seven) days. Start taking on: October 26, 2022  Allergies  Allergen Reactions   Hydrochlorothiazide Rash   Tacrolimus    Cefdinir Rash   Discharge Instructions     Call MD for:   Complete by: As directed    Worsening of lower extremity edema or shortness of breath   Call MD for:  difficulty breathing, headache or visual disturbances   Complete by: As directed    Call MD for:  extreme fatigue   Complete by: As directed    Call MD for:  persistant dizziness or light-headedness   Complete by: As directed    Call MD for:  persistant nausea and vomiting   Complete by: As directed    Call MD for:  severe uncontrolled pain   Complete by: As directed    Call MD for:  temperature >100.4   Complete by: As directed    Diet - low sodium heart healthy   Complete by: As directed    Discharge instructions   Complete by: As directed    Follow-up with PCP in 1 week Follow-up with nephrology as per schedule, patient has  an appointment on 2/14, repeat BMP after 1 week   Increase activity slowly   Complete by: As directed        The results of significant diagnostics from this hospitalization (including imaging, microbiology, ancillary and laboratory) are listed below for reference.    Significant Diagnostic Studies: ECHOCARDIOGRAM COMPLETE  Result Date: 10/18/2022    ECHOCARDIOGRAM REPORT   Patient Name:   Russell Grant Date of Exam: 10/18/2022 Medical Rec #:  [id number]       Height:       70.0 in Accession #:    [id number]      Weight:       293.9 lb Date of Birth:  [birth year]      BSA:          2.458 m Patient Age:    68 years        BP:           140/62 mmHg Patient Gender: M               HR:           71 bpm. Exam Location:  [facility] Procedure: 2D Echo, Color Doppler and Cardiac Doppler Indications:     Cardiomyopathy  History:         Patient has prior history of Echocardiogram examinations. Risk                  Factors:Hypertension and HTD, CKD IV, Morbid Obesity.  Sonographer:     L. Thornton-Maynard Referring Phys:  [id number] Val Riles Diagnosing Phys: Ida Rogue MD IMPRESSIONS  1. Left ventricular ejection fraction, by estimation, is 60 to 65%. The left ventricle has normal function. The left ventricle has no regional wall motion abnormalities. There is mild left ventricular hypertrophy. Left ventricular diastolic parameters were normal.  2. Right ventricular systolic function is normal. The right ventricular size is normal. Tricuspid regurgitation signal is inadequate for assessing PA pressure.  3. The mitral valve is normal in structure. Mild mitral valve regurgitation. No evidence of mitral stenosis.  4. The aortic valve is normal in structure. Aortic valve regurgitation is not visualized. Aortic valve sclerosis is present, with no evidence of aortic valve stenosis.  5. The inferior vena cava is normal in size with greater than 50% respiratory variability, suggesting right atrial pressure of 3 mmHg.  FINDINGSh  Left Ventricle: Left ventricular ejection  fraction, by estimation, is 60 to 65%. The left ventricle has normal function. The left ventricle has no regional wall motion abnormalities. The left ventricular internal cavity size was normal in size. There is  mild left ventricular hypertrophy. Left ventricular diastolic parameters were normal. Right Ventricle: The right ventricular size is normal. No increase in right ventricular wall thickness. Right ventricular systolic function is normal. Tricuspid regurgitation signal is inadequate for assessing PA pressure. Left Atrium: Left atrial size was normal in size. Right Atrium: Right atrial size was normal in size. Pericardium: There is no evidence of pericardial effusion. Mitral Valve: The mitral valve is normal in structure. Mild mitral valve regurgitation. No evidence of mitral valve stenosis. Tricuspid Valve: The tricuspid valve is normal in structure. Tricuspid valve regurgitation is not demonstrated. No evidence of tricuspid stenosis. Aortic Valve: The aortic valve is normal in structure. Aortic valve regurgitation is not visualized. Aortic valve sclerosis is present, with no evidence of aortic valve stenosis. Aortic valve mean gradient measures 4.5 mmHg. Aortic valve peak gradient measures 8.7 mmHg. Aortic valve area, by VTI measures 1.94 cm. Pulmonic Valve: The pulmonic valve was normal in structure. Pulmonic valve regurgitation is not visualized. No evidence of pulmonic stenosis. Aorta: The aortic root is normal in size and structure. Venous: The inferior vena cava is normal in size with greater than 50% respiratory variability, suggesting right atrial pressure of 3 mmHg. IAS/Shunts: No atrial level shunt detected by color flow Doppler.  LEFT VENTRICLE PLAX 2D LVIDd:         5.30 cm      Diastology LVIDs:         3.10 cm      LV e' medial:    7.83 cm/s LV PW:         1.30 cm      LV E/e' medial:  14.8 LV IVS:        1.30 cm      LV e' lateral:   10.10  cm/s LVOT diam:     2.00 cm      LV E/e' lateral: 11.5 LV SV:         54 LV SV Index:   22 LVOT Area:     3.14 cm  LV Volumes (MOD) LV vol d, MOD A2C: 75.2 ml LV vol d, MOD A4C: 121.0 ml LV vol s, MOD A2C: 40.4 ml LV vol s, MOD A4C: 36.3 ml LV SV MOD A2C:     34.8 ml LV SV MOD A4C:     121.0 ml LV SV MOD BP:      61.6 ml RIGHT VENTRICLE RV Basal diam:  4.30 cm RV S prime:     14.00 cm/s TAPSE (M-mode): 2.5 cm LEFT ATRIUM             Index        RIGHT ATRIUM           Index LA diam:        4.10 cm 1.67 cm/m   RA Area:     15.70 cm LA Vol (A2C):   64.7 ml 26.32 ml/m  RA Volume:   40.90 ml  16.64 ml/m LA Vol (A4C):   70.9 ml 28.84 ml/m LA Biplane Vol: 70.7 ml 28.76 ml/m  AORTIC VALVE                    PULMONIC VALVE AV Area (Vmax):    1.98 cm     PV Vmax:  1.23 m/s AV Area (Vmean):   1.97 cm     PV Peak grad:  6.0 mmHg AV Area (VTI):     1.94 cm AV Vmax:           147.50 cm/s AV Vmean:          96.850 cm/s AV VTI:            0.278 m AV Peak Grad:      8.7 mmHg AV Mean Grad:      4.5 mmHg LVOT Vmax:         92.80 cm/s LVOT Vmean:        60.800 cm/s LVOT VTI:          0.172 m LVOT/AV VTI ratio: 0.62  AORTA Ao Root diam: 3.30 cm Ao Asc diam:  3.00 cm MITRAL VALVE MV Area (PHT): 4.31 cm     SHUNTS MV Decel Time: 176 msec     Systemic VTI:  0.17 m MV E velocity: 116.00 cm/s  Systemic Diam: 2.00 cm MV A velocity: 86.50 cm/s MV E/A ratio:  1.34 Ida Rogue MD Electronically signed by Ida Rogue MD Signature Date/Time: 10/18/2022/1:51:07 PM    Final    DG Chest 2 View  Result Date: 10/17/2022 CLINICAL DATA:  Edema EXAM: CHEST - 2 VIEW COMPARISON:  08/04/2020 FINDINGS: The heart size and mediastinal contours are within normal limits. No focal airspace consolidation, pleural effusion, or pneumothorax. The visualized skeletal structures are unremarkable. IMPRESSION: No active cardiopulmonary disease. Electronically Signed   By: Davina Poke D.O.   On: 10/17/2022 15:42   CT ABDOMEN PELVIS WO  CONTRAST  Result Date: 09/30/2022 CLINICAL DATA:  Left lower quadrant abdominal pain. Left flank pain and elevated creatinine. EXAM: CT ABDOMEN AND PELVIS WITHOUT CONTRAST TECHNIQUE: Multidetector CT imaging of the abdomen and pelvis was performed following the standard protocol without IV contrast. RADIATION DOSE REDUCTION: This exam was performed according to the departmental dose-optimization program which includes automated exposure control, adjustment of the mA and/or kV according to patient size and/or use of iterative reconstruction technique. COMPARISON:  Abdominal ultrasound 05/30/2020. FINDINGS: Lower chest: Clear lung bases.  No pleural or pericardial effusion. Hepatobiliary: No focal liver abnormality is seen. No gallstones, gallbladder wall thickening, or biliary dilatation. Pancreas: Unremarkable. No pancreatic ductal dilatation or surrounding inflammatory changes. Spleen: Normal in size without focal abnormality. Adrenals/Urinary Tract: Normal adrenal glands. Kidneys are unremarkable. No renal calculi or hydronephrosis. Bladder is unremarkable. Stomach/Bowel: Unremarkable stomach and duodenal. No dilated loops of small bowel. Normal appendix is visualized on coronal image 59 series 5. Decompressed colon. Mild sigmoid diverticulosis without evidence of acute diverticulitis. Normal rectum. Vascular/Lymphatic: No significant vascular findings are present. No enlarged abdominal or pelvic lymph nodes. Reproductive: Prostate is unremarkable. Other: No significant abdominal wall abnormality.  No ascites. Musculoskeletal: Multilevel lumbar spine degeneration. No suspicious bone lesions. IMPRESSION: 1. No acute abnormality in the abdomen or pelvis. 2. Mild sigmoid diverticulosis without evidence of acute diverticulitis. Electronically Signed   By: Emmit Alexanders M.D.   On: 09/30/2022 16:35    Microbiology: No results found for this or any previous visit (from the past 240 hour(s)).    Labs: CBC: Recent Labs  Lab 10/17/22 1506 10/18/22 0235 10/19/22 0545 10/20/22 0505  WBC 15.0* 14.1* 11.1* 12.6*  NEUTROABS 12.7*  --   --   --   HGB 10.4* 10.1* 11.3* 11.8*  HCT 31.2* 30.5* 34.3* 34.7*  MCV 87.9 87.6 86.8 85.9  PLT 285 255  277 [id number]   Basic Metabolic Panel: Recent Labs  Lab 10/17/22 1504 10/17/22 1506 10/18/22 0235 10/19/22 0545 10/20/22 0505  NA  --  139 141 139 138  K  --  3.0* 3.1* 3.6 2.9*  CL  --  108 110 103 98  CO2  --  21* 26 30 30  $ GLUCOSE  --  176* 105* 109* 102*  BUN  --  46* 45* 53* 59*  CREATININE  --  3.88* 3.61* 3.51* 3.71*  CALCIUM  --  7.2* 7.5* 8.6* 9.0  MG 2.2  --  2.2 2.3 2.2  PHOS  --   --  3.6 5.1* 5.4*   Liver Function Tests: No results for input(s): "AST", "ALT", "ALKPHOS", "BILITOT", "PROT", "ALBUMIN" in the last 168 hours. No results for input(s): "LIPASE", "AMYLASE" in the last 168 hours. No results for input(s): "AMMONIA" in the last 168 hours. Cardiac Enzymes: No results for input(s): "CKTOTAL", "CKMB", "CKMBINDEX", "TROPONINI" in the last 168 hours. BNP (last 3 results) Recent Labs    10/17/22 1504  BNP 770.2*   CBG: Recent Labs  Lab 10/19/22 0749 10/19/22 1115 10/19/22 1625 10/19/22 2038 10/20/22 0747  GLUCAP 102* 119* 114* 127* 102*    Time spent: 35 minutes  Signed:  Val Riles  [facility] 10/20/2022 10:44 AM

## 2022-10-29 ENCOUNTER — Other Ambulatory Visit (HOSPITAL_BASED_OUTPATIENT_CLINIC_OR_DEPARTMENT_OTHER): Payer: Self-pay | Admitting: Osteopathic Medicine

## 2022-12-15 ENCOUNTER — Inpatient Hospital Stay
Admission: EM | Admit: 2022-12-15 | Discharge: 2022-12-17 | DRG: 918 | Disposition: A | Discharge status: Home / Self Care | Payer: BC Managed Care – PPO | Source: Ambulatory Visit | Attending: Hospitalist | Admitting: Hospitalist

## 2022-12-15 ENCOUNTER — Other Ambulatory Visit (INDEPENDENT_AMBULATORY_CARE_PROVIDER_SITE_OTHER): Payer: Self-pay | Admitting: Nurse Practitioner

## 2022-12-15 ENCOUNTER — Other Ambulatory Visit: Payer: Self-pay | Admitting: Internal Medicine

## 2022-12-15 ENCOUNTER — Encounter: Payer: Self-pay | Admitting: Emergency Medicine

## 2022-12-15 ENCOUNTER — Other Ambulatory Visit: Payer: Self-pay

## 2022-12-15 DIAGNOSIS — T502X1A Poisoning by carbonic-anhydrase inhibitors, benzothiadiazides and other diuretics, accidental (unintentional), initial encounter: Secondary | ICD-10-CM | POA: Diagnosis present

## 2022-12-15 DIAGNOSIS — I129 Hypertensive chronic kidney disease with stage 1 through stage 4 chronic kidney disease, or unspecified chronic kidney disease: Secondary | ICD-10-CM | POA: Diagnosis present

## 2022-12-15 DIAGNOSIS — Z1152 Encounter for screening for COVID-19: Secondary | ICD-10-CM | POA: Diagnosis not present

## 2022-12-15 DIAGNOSIS — E1165 Type 2 diabetes mellitus with hyperglycemia: Secondary | ICD-10-CM | POA: Diagnosis present

## 2022-12-15 DIAGNOSIS — E876 Hypokalemia: Secondary | ICD-10-CM | POA: Diagnosis present

## 2022-12-15 DIAGNOSIS — N186 End stage renal disease: Secondary | ICD-10-CM

## 2022-12-15 DIAGNOSIS — N170 Acute kidney failure with tubular necrosis: Secondary | ICD-10-CM | POA: Diagnosis not present

## 2022-12-15 DIAGNOSIS — E1129 Type 2 diabetes mellitus with other diabetic kidney complication: Secondary | ICD-10-CM | POA: Diagnosis present

## 2022-12-15 DIAGNOSIS — D72829 Elevated white blood cell count, unspecified: Secondary | ICD-10-CM | POA: Diagnosis present

## 2022-12-15 DIAGNOSIS — R9431 Abnormal electrocardiogram [ECG] [EKG]: Secondary | ICD-10-CM | POA: Diagnosis present

## 2022-12-15 DIAGNOSIS — N184 Chronic kidney disease, stage 4 (severe): Secondary | ICD-10-CM | POA: Diagnosis present

## 2022-12-15 DIAGNOSIS — Z888 Allergy status to other drugs, medicaments and biological substances status: Secondary | ICD-10-CM | POA: Diagnosis not present

## 2022-12-15 DIAGNOSIS — I10 Essential (primary) hypertension: Secondary | ICD-10-CM | POA: Diagnosis not present

## 2022-12-15 DIAGNOSIS — N179 Acute kidney failure, unspecified: Secondary | ICD-10-CM | POA: Diagnosis present

## 2022-12-15 DIAGNOSIS — N2581 Secondary hyperparathyroidism of renal origin: Secondary | ICD-10-CM | POA: Diagnosis present

## 2022-12-15 DIAGNOSIS — E861 Hypovolemia: Secondary | ICD-10-CM | POA: Diagnosis present

## 2022-12-15 DIAGNOSIS — Z7985 Long-term (current) use of injectable non-insulin antidiabetic drugs: Secondary | ICD-10-CM

## 2022-12-15 DIAGNOSIS — Z79899 Other long term (current) drug therapy: Secondary | ICD-10-CM | POA: Diagnosis not present

## 2022-12-15 DIAGNOSIS — T502X5A Adverse effect of carbonic-anhydrase inhibitors, benzothiadiazides and other diuretics, initial encounter: Secondary | ICD-10-CM | POA: Diagnosis present

## 2022-12-15 DIAGNOSIS — M109 Gout, unspecified: Secondary | ICD-10-CM | POA: Diagnosis present

## 2022-12-15 DIAGNOSIS — Z6839 Body mass index (BMI) 39.0-39.9, adult: Secondary | ICD-10-CM

## 2022-12-15 DIAGNOSIS — Z8249 Family history of ischemic heart disease and other diseases of the circulatory system: Secondary | ICD-10-CM | POA: Diagnosis not present

## 2022-12-15 DIAGNOSIS — Z7982 Long term (current) use of aspirin: Secondary | ICD-10-CM

## 2022-12-15 DIAGNOSIS — R739 Hyperglycemia, unspecified: Secondary | ICD-10-CM

## 2022-12-15 DIAGNOSIS — E1122 Type 2 diabetes mellitus with diabetic chronic kidney disease: Secondary | ICD-10-CM | POA: Diagnosis present

## 2022-12-15 DIAGNOSIS — M1A9XX0 Chronic gout, unspecified, without tophus (tophi): Secondary | ICD-10-CM | POA: Diagnosis not present

## 2022-12-15 DIAGNOSIS — E785 Hyperlipidemia, unspecified: Secondary | ICD-10-CM | POA: Diagnosis present

## 2022-12-15 DIAGNOSIS — N183 Chronic kidney disease, stage 3 unspecified: Secondary | ICD-10-CM | POA: Diagnosis present

## 2022-12-15 LAB — COMPREHENSIVE METABOLIC PANEL
ALT: 28 U/L (ref 0–44)
AST: 45 U/L — ABNORMAL HIGH (ref 15–41)
Albumin: 2.4 g/dL — ABNORMAL LOW (ref 3.5–5.0)
Alkaline Phosphatase: 58 U/L (ref 38–126)
Anion gap: 14 (ref 5–15)
BUN: 84 mg/dL — ABNORMAL HIGH (ref 6–20)
CO2: 31 mmol/L (ref 22–32)
Calcium: 10 mg/dL (ref 8.9–10.3)
Chloride: 90 mmol/L — ABNORMAL LOW (ref 98–111)
Creatinine, Ser: 4.01 mg/dL — ABNORMAL HIGH (ref 0.61–1.24)
GFR, Estimated: 17 mL/min — ABNORMAL LOW (ref >60)
Glucose, Bld: 257 mg/dL — ABNORMAL HIGH (ref 70–99)
Potassium: 2.4 mmol/L — CL (ref 3.5–5.1)
Sodium: 135 mmol/L (ref 135–145)
Total Bilirubin: 0.7 mg/dL (ref 0.3–1.2)
Total Protein: 6.2 g/dL — ABNORMAL LOW (ref 6.5–8.1)

## 2022-12-15 LAB — CBC WITH DIFFERENTIAL/PLATELET
Abs Immature Granulocytes: 0.11 10*3/uL — ABNORMAL HIGH (ref 0.00–0.07)
Basophils Absolute: 0.1 10*3/uL (ref 0.0–0.1)
Basophils Relative: 0 %
Eosinophils Absolute: 0.4 10*3/uL (ref 0.0–0.5)
Eosinophils Relative: 3 %
HCT: 34.5 % — ABNORMAL LOW (ref 39.0–52.0)
Hemoglobin: 11.7 g/dL — ABNORMAL LOW (ref 13.0–17.0)
Immature Granulocytes: 1 %
Lymphocytes Relative: 17 %
Lymphs Abs: 2.3 10*3/uL (ref 0.7–4.0)
MCH: 29.8 pg (ref 26.0–34.0)
MCHC: 33.9 g/dL (ref 30.0–36.0)
MCV: 87.8 fL (ref 80.0–100.0)
Monocytes Absolute: 0.9 10*3/uL (ref 0.1–1.0)
Monocytes Relative: 7 %
Neutro Abs: 9.4 10*3/uL — ABNORMAL HIGH (ref 1.7–7.7)
Neutrophils Relative %: 72 %
Platelets: 292 10*3/uL (ref 150–400)
RBC: 3.93 MIL/uL — ABNORMAL LOW (ref 4.22–5.81)
RDW: 13.4 % (ref 11.5–15.5)
WBC: 13.1 10*3/uL — ABNORMAL HIGH (ref 4.0–10.5)
nRBC: 0 % (ref 0.0–0.2)

## 2022-12-15 LAB — GLUCOSE, CAPILLARY: Glucose-Capillary: 167 mg/dL — ABNORMAL HIGH (ref 70–99)

## 2022-12-15 LAB — MAGNESIUM: Magnesium: 2.3 mg/dL (ref 1.7–2.4)

## 2022-12-15 LAB — PHOSPHORUS: Phosphorus: 4.8 mg/dL — ABNORMAL HIGH (ref 2.5–4.6)

## 2022-12-15 MED ORDER — POTASSIUM CHLORIDE 10 MEQ/100ML IV SOLN
10.0000 meq | INTRAVENOUS | Status: AC
  Administered 2022-12-15: 10 meq via INTRAVENOUS
  Filled 2022-12-15: qty 100

## 2022-12-15 MED ORDER — ROSUVASTATIN CALCIUM 10 MG PO TABS
20.0000 mg | ORAL_TABLET | Freq: Every day | ORAL | Status: DC
  Administered 2022-12-15 – 2022-12-17 (×3): 20 mg via ORAL
  Filled 2022-12-15 (×3): qty 2

## 2022-12-15 MED ORDER — METOLAZONE 5 MG PO TABS: 5.0000 mg | ORAL_TABLET | ORAL | Status: DC

## 2022-12-15 MED ORDER — POTASSIUM CHLORIDE 20 MEQ PO PACK
60.0000 meq | PACK | ORAL | Status: AC
  Administered 2022-12-15: 60 meq via ORAL
  Filled 2022-12-15: qty 3

## 2022-12-15 MED ORDER — ACETAMINOPHEN 650 MG RE SUPP: 650.0000 mg | Freq: Four times a day (QID) | RECTAL | Status: DC | PRN

## 2022-12-15 MED ORDER — DAPAGLIFLOZIN PROPANEDIOL 10 MG PO TABS
10.0000 mg | ORAL_TABLET | Freq: Every day | ORAL | Status: DC
  Administered 2022-12-16 – 2022-12-17 (×2): 10 mg via ORAL
  Filled 2022-12-15 (×2): qty 1

## 2022-12-15 MED ORDER — INSULIN ASPART 100 UNIT/ML IJ SOLN
0.0000 [IU] | Freq: Three times a day (TID) | INTRAMUSCULAR | Status: DC
  Administered 2022-12-16: 3 [IU] via SUBCUTANEOUS
  Administered 2022-12-16: 1 [IU] via SUBCUTANEOUS
  Administered 2022-12-16 – 2022-12-17 (×2): 3 [IU] via SUBCUTANEOUS
  Filled 2022-12-15 (×4): qty 1

## 2022-12-15 MED ORDER — HYDROXYZINE HCL 25 MG PO TABS: 25.0000 mg | ORAL_TABLET | Freq: Three times a day (TID) | ORAL | Status: DC | PRN

## 2022-12-15 MED ORDER — CARVEDILOL 3.125 MG PO TABS
6.2500 mg | ORAL_TABLET | Freq: Two times a day (BID) | ORAL | Status: DC
  Administered 2022-12-16 – 2022-12-17 (×3): 6.25 mg via ORAL
  Filled 2022-12-15 (×3): qty 2

## 2022-12-15 MED ORDER — ASPIRIN 81 MG PO CHEW
81.0000 mg | CHEWABLE_TABLET | Freq: Every day | ORAL | Status: DC
  Administered 2022-12-16 – 2022-12-17 (×2): 81 mg via ORAL
  Filled 2022-12-15 (×2): qty 1

## 2022-12-15 MED ORDER — FEBUXOSTAT 40 MG PO TABS
80.0000 mg | ORAL_TABLET | Freq: Every day | ORAL | Status: DC
  Administered 2022-12-16 – 2022-12-17 (×2): 80 mg via ORAL
  Filled 2022-12-15 (×2): qty 2

## 2022-12-15 MED ORDER — POTASSIUM CHLORIDE CRYS ER 20 MEQ PO TBCR: 40.0000 meq | EXTENDED_RELEASE_TABLET | Freq: Two times a day (BID) | ORAL | Status: DC

## 2022-12-15 MED ORDER — METOLAZONE 2.5 MG PO TABS: 2.5000 mg | ORAL_TABLET | Freq: Every day | ORAL | Status: DC | PRN

## 2022-12-15 MED ORDER — POTASSIUM CHLORIDE 10 MEQ/100ML IV SOLN
10.0000 meq | INTRAVENOUS | Status: AC
  Administered 2022-12-15 – 2022-12-16 (×3): 10 meq via INTRAVENOUS
  Filled 2022-12-15 (×3): qty 100

## 2022-12-15 MED ORDER — ACETAMINOPHEN 325 MG PO TABS: 650.0000 mg | ORAL_TABLET | Freq: Four times a day (QID) | ORAL | Status: DC | PRN

## 2022-12-15 MED ORDER — ADULT MULTIVITAMIN W/MINERALS CH
1.0000 | ORAL_TABLET | Freq: Every day | ORAL | Status: DC
  Administered 2022-12-16 – 2022-12-17 (×2): 1 via ORAL
  Filled 2022-12-15 (×2): qty 1

## 2022-12-15 MED ORDER — AMLODIPINE BESYLATE 10 MG PO TABS
10.0000 mg | ORAL_TABLET | Freq: Every day | ORAL | Status: DC
  Administered 2022-12-16 – 2022-12-17 (×2): 10 mg via ORAL
  Filled 2022-12-15 (×2): qty 1

## 2022-12-15 MED ORDER — TORSEMIDE 20 MG PO TABS
100.0000 mg | ORAL_TABLET | Freq: Every day | ORAL | Status: DC
  Administered 2022-12-16 – 2022-12-17 (×2): 100 mg via ORAL
  Filled 2022-12-15 (×2): qty 5

## 2022-12-15 MED ORDER — INSULIN ASPART 100 UNIT/ML IJ SOLN: 0.0000 [IU] | Freq: Every day | INTRAMUSCULAR | Status: DC

## 2022-12-15 MED ORDER — OYSTER SHELL CALCIUM/D3 500-5 MG-MCG PO TABS
1.0000 | ORAL_TABLET | Freq: Every day | ORAL | Status: DC
  Administered 2022-12-16 – 2022-12-17 (×2): 1 via ORAL
  Filled 2022-12-15 (×2): qty 1

## 2022-12-15 MED ORDER — POTASSIUM CHLORIDE 10 MEQ/100ML IV SOLN: 10.0000 meq | INTRAVENOUS | Status: AC

## 2022-12-15 MED ORDER — FENOFIBRATE 160 MG PO TABS
160.0000 mg | ORAL_TABLET | Freq: Every day | ORAL | Status: DC
  Administered 2022-12-16 – 2022-12-17 (×2): 160 mg via ORAL
  Filled 2022-12-15 (×2): qty 1

## 2022-12-15 MED ORDER — INSULIN ASPART 100 UNIT/ML IJ SOLN
0.0000 [IU] | Freq: Every day | INTRAMUSCULAR | Status: DC
  Administered 2022-12-16: 3 [IU] via SUBCUTANEOUS
  Filled 2022-12-15: qty 1

## 2022-12-15 NOTE — Assessment & Plan Note (Signed)
Severe, associated with EKG changes as above.  Magnesium within normal limits, no historical explanation of current decompensation of potassium level.  Although patient does have chronic diuretic use which places him at risk of hypokalemia.  At this time estimate that patient's total body potassium deficit is about 200 mEq.  However we have to be cautious and replacing the potassium chloride in terms of both her rate as well as patient's ability to excrete any excess potassium given his CKD.  Received 20 mg of KCl in the ER IV I will order an additional 20 mEq IV as well as 60 mEq p.o.  This should equal 100 mEq of KCl which would raise the potassium to about 3 mmmol/l.  Will recheck potassium level at 11:30 PM tonight to guide further potassium replacement maintain on telemetry

## 2022-12-15 NOTE — ED Triage Notes (Signed)
Pt reports he was sent by [facility] for hypokalemia. Pt reports "I feel fine right now."

## 2022-12-15 NOTE — ED Notes (Signed)
First Nurse Note: Patient sent to ED by [facility] for a potassium of 2.6. Wanting potassium infusion.

## 2022-12-15 NOTE — Assessment & Plan Note (Signed)
Monitoring, patient at risk of anasarca based on prior history.  Continue with torsemide and home metolazone

## 2022-12-15 NOTE — ED Notes (Signed)
I have read pt information and attempted handoff several times now

## 2022-12-15 NOTE — ED Provider Notes (Signed)
[facility] Provider Note    Event Date/Time   First MD Initiated Contact with Patient 12/15/22 1923     (approximate)   History   Abnormal Lab   HPI  Russell Grant is a 52 y.o. male with known CKD who comes in with concerns for low potassium.  Patient had an outpatient potassium level 2.6 and so came in for potassium infusion.  Patient otherwise denies any symptoms he reports been compliant with his 40 mill equivalents of potassium twice daily.  Denies any abdominal pain.  Reports urinating okay  Physical Exam   Triage Vital Signs: ED Triage Vitals  Enc Vitals Group     BP 12/15/22 1640 (!) 161/76     Pulse Rate 12/15/22 1640 84     Resp 12/15/22 1640 18     Temp 12/15/22 1640 99 F (37.2 C)     Temp Source 12/15/22 1640 Oral     SpO2 12/15/22 1640 97 %     Weight --      Height --      Head Circumference --      Peak Flow --      Pain Score 12/15/22 1638 0     Pain Loc --      Pain Edu? --      Excl. in GC? --     Most recent vital signs: Vitals:   12/15/22 1640  BP: (!) 161/76  Pulse: 84  Resp: 18  Temp: 99 F (37.2 C)  SpO2: 97%     General: Awake, no distress.  CV:  Good peripheral perfusion.  Resp:  Normal effort.  Abd:  No distention. Soft non tender  Other:     ED Results / Procedures / Treatments   Labs (all labs ordered are listed, but only abnormal results are displayed) Labs Reviewed  CBC WITH DIFFERENTIAL/PLATELET - Abnormal; Notable for the following components:      Result Value   WBC 13.1 (*)    RBC 3.93 (*)    Hemoglobin 11.7 (*)    HCT 34.5 (*)    Neutro Abs 9.4 (*)    Abs Immature Granulocytes 0.11 (*)    All other components within normal limits  COMPREHENSIVE METABOLIC PANEL - Abnormal; Notable for the following components:   Potassium 2.4 (*)    Chloride 90 (*)    Glucose, Bld 257 (*)    BUN 84 (*)    Creatinine, Ser 4.01 (*)    Total Protein 6.2 (*)    Albumin 2.4 (*)    AST 45 (*)     GFR, Estimated 17 (*)    All other components within normal limits     EKG  My interpretation of EKG:  Normal sinus rate of 81 without any ST elevation or T wave inversions, QTc is 599    PROCEDURES:  Critical Care performed: Yes, see critical care procedure note(s)  .1-3 Lead EKG Interpretation  Performed by: Concha Se, MD Authorized by: Concha Se, MD     Interpretation: normal     ECG rate:  80   ECG rate assessment: normal     Rhythm: sinus rhythm     Ectopy: none     Conduction: normal   .Critical Care  Performed by: Concha Se, MD Authorized by: Concha Se, MD   Critical care provider statement:    Critical care time (minutes):  30   Critical care was necessary to  treat or prevent imminent or life-threatening deterioration of the following conditions: hypokalemia with ekg changes.   Critical care was time spent personally by me on the following activities:  Development of treatment plan with patient or surrogate, discussions with consultants, evaluation of patient's response to treatment, examination of patient, ordering and review of laboratory studies, ordering and review of radiographic studies, ordering and performing treatments and interventions, pulse oximetry, re-evaluation of patient's condition and review of old charts    MEDICATIONS ORDERED IN ED: Medications - No data to display   IMPRESSION / MDM / ASSESSMENT AND PLAN / ED COURSE  I reviewed the triage vital signs and the nursing notes.   Patient's presentation is most consistent with acute presentation with potential threat to life or bodily function.   Patient comes in with significant electrolyte abnormalities most likely known to be from his known CKD currently being worked up by nephrology.  EKG ordered does show prolonged QTc therefore patient need to be kept on the cardiac monitor will add on magnesium to check for a mag level.  Given his CKD I do not want to get actually  overcorrect his potassium so we will start off with 20 of IV I discussed with patient admission given the EKG changes for cardiac monitoring and he expressed understanding and felt comfortable with plan Cbc similar white count hemoglobin stable K is 2.4 CR is up to 4.0  The patient is on the cardiac monitor to evaluate for evidence of arrhythmia and/or significant heart rate changes.      FINAL CLINICAL IMPRESSION(S) / ED DIAGNOSES   Final diagnoses:  Hypokalemia  QT prolongation     Rx / DC Orders   ED Discharge Orders     None        Note:  This document was prepared using Dragon voice recognition software and may include unintentional dictation errors.   Concha Se, MD 12/15/22 2033
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
## 2022-12-15 NOTE — Assessment & Plan Note (Signed)
Hold off on insulin therapy till hypokalemia is better corrected.  Restart insulin in the morning.  Patient is on Toujeo at home which is nonformulary here in the hospital we will hold off on the same.

## 2022-12-15 NOTE — Assessment & Plan Note (Signed)
Chronic, outpatient hematology referral

## 2022-12-16 DIAGNOSIS — I10 Essential (primary) hypertension: Secondary | ICD-10-CM

## 2022-12-16 DIAGNOSIS — N184 Chronic kidney disease, stage 4 (severe): Secondary | ICD-10-CM

## 2022-12-16 DIAGNOSIS — N170 Acute kidney failure with tubular necrosis: Secondary | ICD-10-CM

## 2022-12-16 DIAGNOSIS — M1A9XX0 Chronic gout, unspecified, without tophus (tophi): Secondary | ICD-10-CM

## 2022-12-16 DIAGNOSIS — E876 Hypokalemia: Secondary | ICD-10-CM | POA: Diagnosis not present

## 2022-12-16 DIAGNOSIS — E1129 Type 2 diabetes mellitus with other diabetic kidney complication: Secondary | ICD-10-CM

## 2022-12-16 LAB — GLUCOSE, CAPILLARY
Glucose-Capillary: 128 mg/dL — ABNORMAL HIGH (ref 70–99)
Glucose-Capillary: 226 mg/dL — ABNORMAL HIGH (ref 70–99)
Glucose-Capillary: 204 mg/dL — ABNORMAL HIGH (ref 70–99)
Glucose-Capillary: 277 mg/dL — ABNORMAL HIGH (ref 70–99)

## 2022-12-16 LAB — BASIC METABOLIC PANEL
Anion gap: 9 (ref 5–15)
BUN: 77 mg/dL — ABNORMAL HIGH (ref 6–20)
CO2: 29 mmol/L (ref 22–32)
Calcium: 9 mg/dL (ref 8.9–10.3)
Chloride: 101 mmol/L (ref 98–111)
Creatinine, Ser: 3.82 mg/dL — ABNORMAL HIGH (ref 0.61–1.24)
GFR, Estimated: 18 mL/min — ABNORMAL LOW (ref >60)
Glucose, Bld: 153 mg/dL — ABNORMAL HIGH (ref 70–99)
Potassium: 2.4 mmol/L — CL (ref 3.5–5.1)
Sodium: 139 mmol/L (ref 135–145)

## 2022-12-16 LAB — MAGNESIUM: Magnesium: 2.1 mg/dL (ref 1.7–2.4)

## 2022-12-16 LAB — POTASSIUM: Potassium: 2.8 mmol/L — ABNORMAL LOW (ref 3.5–5.1)

## 2022-12-16 MED ORDER — POTASSIUM CHLORIDE 10 MEQ/100ML IV SOLN
10.0000 meq | INTRAVENOUS | Status: AC
  Administered 2022-12-16 (×3): 10 meq via INTRAVENOUS
  Filled 2022-12-16 (×3): qty 100

## 2022-12-16 MED ORDER — PREDNISONE 20 MG PO TABS
40.0000 mg | ORAL_TABLET | Freq: Once | ORAL | Status: AC
  Administered 2022-12-16: 40 mg via ORAL
  Filled 2022-12-16: qty 2

## 2022-12-16 MED ORDER — POTASSIUM CHLORIDE CRYS ER 20 MEQ PO TBCR
40.0000 meq | EXTENDED_RELEASE_TABLET | Freq: Once | ORAL | Status: AC
  Administered 2022-12-16: 40 meq via ORAL
  Filled 2022-12-16: qty 2

## 2022-12-16 MED ORDER — POTASSIUM CHLORIDE CRYS ER 20 MEQ PO TBCR
40.0000 meq | EXTENDED_RELEASE_TABLET | Freq: Three times a day (TID) | ORAL | Status: DC
  Administered 2022-12-16 – 2022-12-17 (×4): 40 meq via ORAL
  Filled 2022-12-16 (×4): qty 2

## 2022-12-16 MED ORDER — METOLAZONE 2.5 MG PO TABS: 2.5000 mg | ORAL_TABLET | ORAL | Status: DC

## 2022-12-16 NOTE — Assessment & Plan Note (Signed)
-  Continue Crestor, fenofibrate 

## 2022-12-16 NOTE — Hospital Course (Signed)
Russell Grant is a 52 y.o. M with obesity, CKD IV baseline 2.3, HTN, DM who presented from Nephrology clinic with hypokalemia and worsening renal insufficiency in setting of increased diuretics in the last few months.

## 2022-12-16 NOTE — TOC Initial Note (Signed)
Transition of Care [facility]) - Initial/Assessment Note    Patient Details  Name: Russell Grant MRN: [id number] Date of Birth: Jun 24, 1971  Transition of Care [facility]) CM/SW Contact:    Marlowe Sax, RN Phone Number: 12/16/2022, 8:06 AM  Clinical Narrative:      Transition of Care (TOC) Screening Note   Patient Details  Name: Russell Grant Date of Birth: June 22, 1971   Transition of Care [facility]) CM/SW Contact:    Marlowe Sax, RN Phone Number: 12/16/2022, 8:06 AM    Transition of Care Department [facility]) has reviewed patient and no TOC needs have been identified at this time. We will continue to monitor patient advancement through interdisciplinary progression rounds. If new patient transition needs arise, please place a TOC consult.           Expected Discharge Plan: Home/Self Care Barriers to Discharge: No Barriers Identified   Patient Goals and CMS Choice            Expected Discharge Plan and Services       Living arrangements for the past 2 months: Single Family Home                                      Prior Living Arrangements/Services Living arrangements for the past 2 months: Single Family Home Lives with:: Spouse                   Activities of Daily Living Home Assistive Devices/Equipment: CPAP ADL Screening (condition at time of admission) Patient's cognitive ability adequate to safely complete daily activities?: Yes Is the patient deaf or have difficulty hearing?: No Does the patient have difficulty seeing, even when wearing glasses/contacts?: No Does the patient have difficulty concentrating, remembering, or making decisions?: No Patient able to express need for assistance with ADLs?: No Does the patient have difficulty dressing or bathing?: No Independently performs ADLs?: Yes (appropriate for developmental age) Does the patient have difficulty walking or climbing stairs?: No Weakness of Legs: None Weakness of Arms/Hands:  None  Permission Sought/Granted                  Emotional Assessment              Admission diagnosis:  Hypokalemia [E87.6] QT prolongation [R94.31] Patient Active Problem List   Diagnosis Date Noted   Hyperglycemia 12/15/2022   SOB (shortness of breath) 10/17/2022   Leukocytosis 10/17/2022   CKD (chronic kidney disease) stage 4, GFR 15-29 ml/min 10/17/2022   Acute renal failure superimposed on stage 4 chronic kidney disease 09/30/2022   Type II diabetes mellitus with renal manifestations 09/30/2022   HLD (hyperlipidemia) 09/30/2022   Hypokalemia 09/30/2022   Elevated lipase 09/30/2022   Obesity, Class III, BMI 40-49.9 (morbid obesity) 09/30/2022   Gout 09/30/2022   Generalized weakness 08/04/2020   Dehydration 08/04/2020   New onset type 2 diabetes mellitus 08/04/2020   SIRS due to infectious process with acute organ dysfunction 08/04/2020   FSGS (focal segmental glomerulosclerosis), tip variant with nephrosis    Nephrotic syndrome 05/09/2020   AKI (acute kidney injury) 05/07/2020   Anasarca 05/07/2020   CKD (chronic kidney disease) stage 3, GFR 30-59 ml/min 03/30/2019   Hyperuricemia 03/30/2019   History of gout 03/30/2019   IFG (impaired fasting glucose) 03/30/2019   Personal history of other malignant neoplasm of skin 09/10/2017   SCC (squamous cell carcinoma), hand 08/24/2017  Class 2 severe obesity due to excess calories with serious comorbidity and body mass index (BMI) of 38.0 to 38.9 in adult 08/24/2017   Hypertriglyceridemia    Essential hypertension    PCP:  Marina Goodell, MD Pharmacy:   [facility] [phone] [facility] Closed - [place], [place] - [address] [place] [address] Phone: [phone] Fax: [phone]  [id number] [place] - [address] [place] [address] Phone: [phone] Fax: [phone]     Social Determinants of Health (SDOH) Social History: SDOH Screenings   Food  Insecurity: No Food Insecurity (12/15/2022)  Housing: Low Risk  (12/15/2022)  Transportation Needs: No Transportation Needs (12/15/2022)  Utilities: Not At Risk (12/15/2022)  Tobacco Use: Low Risk  (12/15/2022)   SDOH Interventions:     Readmission Risk Interventions    10/19/2022   12:24 PM 08/11/2020    2:13 PM 08/06/2020   10:27 AM  Readmission Risk Prevention Plan  Transportation Screening Complete Complete Complete  Social Work Consult for Recovery Care Planning/Counseling Complete    Palliative Care Screening Not Applicable  Not Applicable  Medication Review [employer]) Complete Complete Complete  Palliative Care Screening  Not Applicable   Skilled Nursing Facility  Not Applicable

## 2022-12-16 NOTE — Plan of Care (Signed)
  Problem: Fluid Volume: Goal: Ability to maintain a balanced intake and output will improve Outcome: Progressing   Problem: Metabolic: Goal: Ability to maintain appropriate glucose levels will improve Outcome: Progressing   Problem: Clinical Measurements: Goal: Ability to maintain clinical measurements within normal limits will improve Outcome: Progressing   Problem: Safety: Goal: Ability to remain free from injury will improve Outcome: Progressing

## 2022-12-16 NOTE — Assessment & Plan Note (Signed)
Glucoses okay today - Continue SS corrections - Continue Marcelline Deist

## 2022-12-16 NOTE — Assessment & Plan Note (Signed)
Baseline Cr 2.3 2 months ago, here up to 4 mg/dL on admission.   - Continue torsemide - Decrease metolazone to 2.5 twice weekly

## 2022-12-16 NOTE — Assessment & Plan Note (Addendum)
BMI 39.98 with comorbid hypertension and diabetes

## 2022-12-16 NOTE — Assessment & Plan Note (Signed)
BP slightrly elevated - Continue amlodipine, Coreg, toresmide

## 2022-12-16 NOTE — Progress Notes (Signed)
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 Patient presents to the emergency department at the advice of his nephrologist due to abnormal labs. Patient has been admitted for Hypokalemia [E87.6] QT prolongation [R94.31]  Patient is known to our practice and is followed by Dr. Cherylann RatelLateef.  Patient was last seen in office on 12/14/2022.  Patient resting comfortably in bed, wife at bedside.  Patient states he feels fine and if it had not been for abnormal labs, he does not feel as if he needs to be here.  Patient denies nausea, vomiting, or diarrhea.  Remains on room air, denies shortness of breath or cough.  Denies any pain or discomfort.  Patient states he takes all medication as prescribed.  States he does take his diuretics, torsemide 100 mg, daily.  Has been instructed to take twice a day if edema is worse.  Patient currently prescribed to take metolazone 5 mg twice weekly.  Patient admits to taking this daily for the past month to assist in fluid management as well.  Patient states this regimen has been working to manage his fluid volume.  Labs on ED arrival significant for potassium 2.4, glucose 257, BUN 84, creatinine 4.01 with GFR 17, albumin 2.4 and hemoglobin 11.7.  Patient has received potassium supplementation, both IV and oral.  We have been consulted to help manage electrolyte imbalance and acute kidney injury  Objective:  Vital signs in last 24 hours:  Temp:  [98.1 F (36.7 C)-99 F (37.2 C)] 98.1 F (36.7 C) (04/10 0802) Pulse Rate:  [79-84] 79 (04/10 0802) Resp:  [18-20] 20 (04/10 0802) BP: (148-161)/(61-76) 148/64 (04/10 0802) SpO2:  [97 %-98 %] 98 % (04/10 0802) Weight:  [126.4 kg] 126.4 kg (04/10 0500)  Weight change:  Filed Weights   12/15/22 2312 12/16/22 0500  Weight: 126.4 kg 126.4 kg     Intake/Output: I/O last 3 completed shifts: In: 100 [IV Piggyback:100] Out: -    Intake/Output this shift:  Total I/O In: 240 [P.O.:240] Out: 800 [Urine:800]  Physical Exam: General: NAD  Head: Normocephalic, atraumatic. Moist oral mucosal membranes  Eyes: Anicteric  Lungs:  Clear to auscultation, normal effort, room air  Heart: Regular rate and rhythm  Abdomen:  Soft, nontender, obese  Extremities: No peripheral edema.  Neurologic: Alert and oriented, moving all four extremities  Skin: No lesions  Access: None    Basic Metabolic Panel: Recent Labs  Lab 12/15/22 1710 12/16/22 0500 12/16/22 1243  NA 135 139  --   K 2.4* 2.4* 2.8*  CL 90* 101  --   CO2 31 29  --   GLUCOSE 257* 153*  --   BUN 84* 77*  --   CREATININE 4.01* 3.82*  --   CALCIUM 10.0 9.0  --   MG 2.3 2.1  --   PHOS 4.8*  --   --     Liver Function Tests: Recent Labs  Lab 12/15/22 1710  AST 45*  ALT 28  ALKPHOS 58  BILITOT 0.7  PROT 6.2*  ALBUMIN 2.4*   No results for input(s): "LIPASE", "AMYLASE" in the last 168 hours. No results for input(s): "AMMONIA" in the last 168 hours.  CBC: Recent Labs  Lab 12/15/22 1710  WBC 13.1*  NEUTROABS 9.4*  HGB 11.7*  HCT 34.5*  MCV 87.8  PLT 292  Cardiac Enzymes: No results for input(s): "CKTOTAL", "CKMB", "CKMBINDEX", "TROPONINI" in the last 168 hours.  BNP: Invalid input(s): "POCBNP"  CBG: Recent Labs  Lab 12/15/22 2247 12/16/22 0802 12/16/22 1135  GLUCAP 167* 128* 226*    Microbiology: Results for orders placed or performed during the hospital encounter of 08/04/20  Resp Panel by RT-PCR (Flu A&B, Covid) Nasopharyngeal Swab     Status: None   Collection Time: 08/04/20  5:07 AM   Specimen: Nasopharyngeal Swab; Nasopharyngeal(NP) swabs in vial transport medium  Result Value Ref Range Status   SARS Coronavirus 2 by RT PCR NEGATIVE NEGATIVE Final    Comment: (NOTE) SARS-CoV-2 target nucleic acids are NOT DETECTED.  The  SARS-CoV-2 RNA is generally detectable in upper respiratory specimens during the acute phase of infection. The lowest concentration of SARS-CoV-2 viral copies this assay can detect is 138 copies/mL. A negative result does not preclude SARS-Cov-2 infection and should not be used as the sole basis for treatment or other patient management decisions. A negative result may occur with  improper specimen collection/handling, submission of specimen other than nasopharyngeal swab, presence of viral mutation(s) within the areas targeted by this assay, and inadequate number of viral copies(<138 copies/mL). A negative result must be combined with clinical observations, patient history, and epidemiological information. The expected result is Negative.  Fact Sheet for Patients:  BloggerCourse.com  Fact Sheet for Healthcare Providers:  SeriousBroker.it  This test is no t yet approved or cleared by the [place] FDA and  has been authorized for detection and/or diagnosis of SARS-CoV-2 by FDA under an Emergency Use Authorization (EUA). This EUA will remain  in effect (meaning this test can be used) for the duration of the COVID-19 declaration under Section 564(b)(1) of the Act, 21 U.S.C.section 360bbb-3(b)(1), unless the authorization is terminated  or revoked sooner.       Influenza A by PCR NEGATIVE NEGATIVE Final   Influenza B by PCR NEGATIVE NEGATIVE Final    Comment: (NOTE) The Xpert Xpress SARS-CoV-2/FLU/RSV plus assay is intended as an aid in the diagnosis of influenza from Nasopharyngeal swab specimens and should not be used as a sole basis for treatment. Nasal washings and aspirates are unacceptable for Xpert Xpress SARS-CoV-2/FLU/RSV testing.  Fact Sheet for Patients: BloggerCourse.com  Fact Sheet for Healthcare Providers: SeriousBroker.it  This test is not yet approved or  cleared by the [place] FDA and has been authorized for detection and/or diagnosis of SARS-CoV-2 by FDA under an Emergency Use Authorization (EUA). This EUA will remain in effect (meaning this test can be used) for the duration of the COVID-19 declaration under Section 564(b)(1) of the Act, 21 U.S.C. section 360bbb-3(b)(1), unless the authorization is terminated or revoked.  Performed at [facility], [address] [place], [place] [address]   Culture, blood (routine x 2)     Status: None   Collection Time: 08/04/20  7:40 AM   Specimen: BLOOD  Result Value Ref Range Status   Specimen Description   Final    BLOOD LEFT ANTECUBITAL Performed at [facility], [address], [place], [place] [address]    Special Requests   Final    BOTTLES DRAWN AEROBIC AND ANAEROBIC Blood Culture results may not be optimal due to an excessive volume of blood received in culture bottles Performed at [facility], [address] [place], [place] [address]    Culture   Final    NO GROWTH 5 DAYS Performed at [facility],  [address] [place], [place] [address]    Report Status 08/09/2020 FINAL  Final  Culture, blood (routine x 2)     Status: None   Collection Time: 08/04/20  2:40 PM   Specimen: BLOOD  Result Value Ref Range Status   Specimen Description BLOOD RIGHT ANTECUBITAL  Final   Special Requests   Final    BOTTLES DRAWN AEROBIC AND ANAEROBIC Blood Culture results may not be optimal due to an inadequate volume of blood received in culture bottles   Culture   Final    NO GROWTH 5 DAYS Performed at [facility] [address], [place], [place] [address]    Report Status 08/09/2020 FINAL  Final    Coagulation Studies: No results for input(s): "LABPROT", "INR" in the last 72 hours.  Urinalysis: No results for input(s): "COLORURINE", "LABSPEC", "PHURINE", "GLUCOSEU", "HGBUR", "BILIRUBINUR", "KETONESUR", "PROTEINUR", "UROBILINOGEN",  "NITRITE", "LEUKOCYTESUR" in the last 72 hours.  Invalid input(s): "APPERANCEUR"    Imaging: No results found.   Medications:     amLODipine  10 mg Oral Daily   aspirin  81 mg Oral Daily   calcium-vitamin D  1 tablet Oral Daily   carvedilol  6.25 mg Oral BID WC   dapagliflozin propanediol  10 mg Oral Daily   febuxostat  80 mg Oral Daily   fenofibrate  160 mg Oral Daily   insulin aspart  0-5 Units Subcutaneous QHS   insulin aspart  0-9 Units Subcutaneous TID WC   [START ON 12/17/2022] metolazone  5 mg Oral Once per day on Mon Thu   multivitamin with minerals  1 tablet Oral Daily   potassium chloride SA  40 mEq Oral TID   rosuvastatin  20 mg Oral Daily   torsemide  100 mg Oral Daily   acetaminophen **OR** acetaminophen, hydrOXYzine, metolazone  Assessment/ Plan:  Russell Grant is a 52 y.o.  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 Patient presents to the emergency department at the advice of his nephrologist due to abnormal labs. Patient has been admitted for Hypokalemia [E87.6] QT prolongation [R94.31]   Acute Kidney Injury on chronic kidney disease stage IV with baseline creatinine 3.17 and GFR of 23 on 11/02/22.  Acute kidney injury secondary to aggressive diuresis with hypovolemia. 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  Patient admits to aggressive diuresis with home medications 1 month prior to admission.  Educated patient on medication compliance.  Will recommend decreasing to metolazone 2.5 mg twice weekly and maintain torsemide 100 mg daily.  Will consider spironolactone if needed at a later date.    Lab Results  Component Value Date   CREATININE 3.82 (H) 12/16/2022   CREATININE 4.01 (H) 12/15/2022   CREATININE 3.71 (H) 10/20/2022    Intake/Output Summary (Last 24  hours) at 12/16/2022 1423 Last data filed at 12/16/2022 1057 Gross per 24 hour  Intake 340 ml  Output 800 ml  Net -460 ml   2.  Hypokalemia secondary to aggressive diuresis.  Patient has received IV and oral supplementation.  Potassium 2.4 this morning.  Latest potassium 2.8.  Primary team to continue with IV and oral supplementation.  3. Anemia of chronic kidney disease Lab Results  Component Value Date   HGB 11.7 (L) 12/15/2022    Hemoglobin within desired range.  No need for ESA's at this time.  4. 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 Home regimen includes [place]. Most recent hemoglobin A1c is 7.2 on 12/07/22.   5. Secondary Hyperparathyroidism: with outpatient labs: PTH 21, phosphorus 6.1, calcium 10.0 on 12/14/22.   Lab Results  Component Value Date   CALCIUM 9.0 12/16/2022   PHOS 4.8 (H) 12/15/2022  Patient receiving calcium supplementation and ergocalciferol outpatient.  Bone minerals acceptable at this time.   LOS: 1 Vonzella Althaus 4/10/20242:23 PM

## 2022-12-16 NOTE — Assessment & Plan Note (Signed)
Patient reporting joint pain today, typically uses prednisone x1 for treatment. - Prednisone 40 mg x1

## 2022-12-16 NOTE — Plan of Care (Signed)
  Problem: Education: Goal: Ability to describe self-care measures that may prevent or decrease complications (Diabetes Survival Skills Education) will improve Outcome: Progressing   Problem: Coping: Goal: Ability to adjust to condition or change in health will improve Outcome: Progressing   Problem: Fluid Volume: Goal: Ability to maintain a balanced intake and output will improve Outcome: Progressing   Problem: Health Behavior/Discharge Planning: Goal: Ability to identify and utilize available resources and services will improve Outcome: Progressing   Problem: Nutritional: Goal: Maintenance of adequate nutrition will improve Outcome: Progressing   Problem: Skin Integrity: Goal: Risk for impaired skin integrity will decrease Outcome: Progressing   

## 2022-12-16 NOTE — Progress Notes (Signed)
  Progress Note   Patient: Russell Grant RPR:[id number] DOB: [birth year] DOA: 12/15/2022     1 DOS: the patient was seen and examined on 12/16/2022 at 10:45AM      Brief hospital course: Mr. Viens is a 52 y.o. M with obesity, CKD IV baseline 2.3, HTN, DM who presented from Nephrology clinic with hypokalemia and worsening renal insufficiency in setting of increased diuretics in the last few months.     Assessment and Plan: * Hypokalemia Received 130 mEq K overnight, K still 3.4 mmol/L this morning.  Got another 70 mEq and K only up to 2.8 mmol/L.  - Continue potassium supplement  - Trend K - Appreciate Nephrology expertise - Reduce diuretics as below     Acute renal failure superimposed on stage 4 chronic kidney disease Baseline Cr 2.3 2 months ago, here up to 4 mg/dL on admission.   - Continue torsemide - Decrease metolazone to 2.5 twice weekly  Essential hypertension BP slightrly elevated - Continue amlodipine, Coreg, toresmide  Type II diabetes mellitus with renal manifestations and hyperglycemia Glucoses okay today - Continue SS corrections - Continue Farxiga  HLD (hyperlipidemia) - Continue Crestor, fenofibrate  Morbid obesity BMI 39.98 with comorbid hypertension and diabetes  Gout Patient reporting joint pain today, typically uses prednisone x1 for treatment. - Prednisone 40 mg x1          Subjective: No complaints, patient feels fine.     Physical Exam: BP (!) 152/68 (BP Location: Left Arm)   Pulse 79   Temp 98.2 F (36.8 C)   Resp 18   Ht 5\' 10"  (1.778 m)   Wt 126.4 kg   SpO2 99%   BMI 39.98 kg/m   Adult male, lying in bed, no acute distress RRR, no murmurs, no peripheral edema Respiratory rate normal, lungs clear without rales or wheezes Abdomen soft without tenderness palpation or guarding, no ascites or distention Attention normal, affect normal, judgment insight appear normal     Data Reviewed: Magnesium normal Basic  metabolic panel shows potassium still low, creatinine slightly improved to 3.8  Family Communication: Wife at the bedside    Disposition: Status is: Inpatient Patient will need ongoing IV potassium        Author: Alberteen Sam, MD 12/16/2022 4:56 PM  For on call review [email or web address].

## 2022-12-17 DIAGNOSIS — E876 Hypokalemia: Secondary | ICD-10-CM | POA: Diagnosis not present

## 2022-12-17 LAB — HEMOGLOBIN A1C
Hgb A1c MFr Bld: 7.4 % — ABNORMAL HIGH (ref 4.8–5.6)
Mean Plasma Glucose: 166 mg/dL

## 2022-12-17 LAB — CREATININE, SERUM
Creatinine, Ser: 3.66 mg/dL — ABNORMAL HIGH (ref 0.61–1.24)
GFR, Estimated: 19 mL/min — ABNORMAL LOW (ref >60)

## 2022-12-17 LAB — GLUCOSE, CAPILLARY: Glucose-Capillary: 215 mg/dL — ABNORMAL HIGH (ref 70–99)

## 2022-12-17 LAB — POTASSIUM: Potassium: 3.2 mmol/L — ABNORMAL LOW (ref 3.5–5.1)

## 2022-12-17 MED ORDER — PREDNISONE 20 MG PO TABS: 20.0000 mg | ORAL_TABLET | Freq: Two times a day (BID) | ORAL | 3 refills | Status: AC

## 2022-12-17 NOTE — Progress Notes (Signed)
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 Patient presents to the emergency department at the advice of his nephrologist due to abnormal labs. Patient has been admitted for Hypokalemia [E87.6] QT prolongation [R94.31]  Patient is known to our practice and is followed by Dr. Cherylann Ratel.  Patient was last seen in office on 12/14/2022.    Patient seen resting comfortably Wife at bedside Tolerating meals without nausea or vomiting Denies numbness or tingling in extremities  Potassium 3.2  Objective:  Vital signs in last 24 hours:  Temp:  [97.9 F (36.6 C)-98.3 F (36.8 C)] 97.9 F (36.6 C) (04/11 0805) Pulse Rate:  [79-83] 80 (04/11 0805) Resp:  [18-20] 18 (04/11 0805) BP: (133-152)/(57-68) 133/57 (04/11 0805) SpO2:  [97 %-99 %] 98 % (04/11 0805)  Weight change:  Filed Weights   12/15/22 2312 12/16/22 0500  Weight: 126.4 kg 126.4 kg    Intake/Output: I/O last 3 completed shifts: In: 645 [P.O.:240; IV Piggyback:405] Out: 2200 [Urine:2200]   Intake/Output this shift:  No intake/output data recorded.  Physical Exam: General: NAD  Head: Normocephalic, atraumatic. Moist oral mucosal membranes  Eyes: Anicteric  Lungs:  Clear to auscultation, normal effort, room air  Heart: Regular rate and rhythm  Abdomen:  Soft, nontender, obese  Extremities: No peripheral edema.  Neurologic: Alert and oriented, moving all four extremities  Skin: No lesions  Access: None    Basic Metabolic Panel: Recent Labs  Lab 12/15/22 1710 12/16/22 0500 12/16/22 1243 12/17/22 0513  NA 135 139  --   --   K 2.4* 2.4* 2.8* 3.2*  CL 90* 101  --   --   CO2 31 29  --   --   GLUCOSE 257* 153*  --   --   BUN 84* 77*  --   --   CREATININE 4.01* 3.82*  --  3.66*  CALCIUM 10.0 9.0  --   --   MG 2.3 2.1  --   --   PHOS  4.8*  --   --   --      Liver Function Tests: Recent Labs  Lab 12/15/22 1710  AST 45*  ALT 28  ALKPHOS 58  BILITOT 0.7  PROT 6.2*  ALBUMIN 2.4*    No results for input(s): "LIPASE", "AMYLASE" in the last 168 hours. No results for input(s): "AMMONIA" in the last 168 hours.  CBC: Recent Labs  Lab 12/15/22 1710  WBC 13.1*  NEUTROABS 9.4*  HGB 11.7*  HCT 34.5*  MCV 87.8  PLT 292     Cardiac Enzymes: No results for input(s): "CKTOTAL", "CKMB", "CKMBINDEX", "TROPONINI" in the last 168 hours.  BNP: Invalid input(s): "POCBNP"  CBG: Recent Labs  Lab 12/16/22 0802 12/16/22 1135 12/16/22 1654 12/16/22 2230 12/17/22 0742  GLUCAP 128* 226* 204* 277* 215*     Microbiology: Results for orders placed or performed during the hospital encounter of 08/04/20  Resp Panel by RT-PCR (Flu A&B, Covid) Nasopharyngeal Swab     Status: None   Collection Time: 08/04/20  5:07 AM   Specimen: Nasopharyngeal Swab; Nasopharyngeal(NP) swabs in vial transport medium  Result Value Ref Range Status   SARS Coronavirus 2 by RT PCR NEGATIVE NEGATIVE Final    Comment: (NOTE) SARS-CoV-2 target nucleic acids are NOT DETECTED.  The SARS-CoV-2 RNA is generally detectable in upper respiratory specimens during  the acute phase of infection. The lowest concentration of SARS-CoV-2 viral copies this assay can detect is 138 copies/mL. A negative result does not preclude SARS-Cov-2 infection and should not be used as the sole basis for treatment or other patient management decisions. A negative result may occur with  improper specimen collection/handling, submission of specimen other than nasopharyngeal swab, presence of viral mutation(s) within the areas targeted by this assay, and inadequate number of viral copies(<138 copies/mL). A negative result must be combined with clinical observations, patient history, and epidemiological information. The expected result is Negative.  Fact Sheet for  Patients:  BloggerCourse.comhttps://www.fda.gov/media/152166/download  Fact Sheet for Healthcare Providers:  SeriousBroker.ithttps://www.fda.gov/media/152162/download  This test is no t yet approved or cleared by the [place] FDA and  has been authorized for detection and/or diagnosis of SARS-CoV-2 by FDA under an Emergency Use Authorization (EUA). This EUA will remain  in effect (meaning this test can be used) for the duration of the COVID-19 declaration under Section 564(b)(1) of the Act, 21 U.S.C.section 360bbb-3(b)(1), unless the authorization is terminated  or revoked sooner.       Influenza A by PCR NEGATIVE NEGATIVE Final   Influenza B by PCR NEGATIVE NEGATIVE Final    Comment: (NOTE) The Xpert Xpress SARS-CoV-2/FLU/RSV plus assay is intended as an aid in the diagnosis of influenza from Nasopharyngeal swab specimens and should not be used as a sole basis for treatment. Nasal washings and aspirates are unacceptable for Xpert Xpress SARS-CoV-2/FLU/RSV testing.  Fact Sheet for Patients: BloggerCourse.comhttps://www.fda.gov/media/152166/download  Fact Sheet for Healthcare Providers: SeriousBroker.ithttps://www.fda.gov/media/152162/download  This test is not yet approved or cleared by the [place] FDA and has been authorized for detection and/or diagnosis of SARS-CoV-2 by FDA under an Emergency Use Authorization (EUA). This EUA will remain in effect (meaning this test can be used) for the duration of the COVID-19 declaration under Section 564(b)(1) of the Act, 21 U.S.C. section 360bbb-3(b)(1), unless the authorization is terminated or revoked.  Performed at [facility], [address] [place], [place] [address]   Culture, blood (routine x 2)     Status: None   Collection Time: 08/04/20  7:40 AM   Specimen: BLOOD  Result Value Ref Range Status   Specimen Description   Final    BLOOD LEFT ANTECUBITAL Performed at [facility], [address] [place], [place] [address]    Special Requests   Final     BOTTLES DRAWN AEROBIC AND ANAEROBIC Blood Culture results may not be optimal due to an excessive volume of blood received in culture bottles Performed at [facility], [address] [place], [place] [address]    Culture   Final    NO GROWTH 5 DAYS Performed at [facility], [address] [place], [place] [address]    Report Status 08/09/2020 FINAL  Final  Culture, blood (routine x 2)     Status: None   Collection Time: 08/04/20  2:40 PM   Specimen: BLOOD  Result Value Ref Range Status   Specimen Description BLOOD RIGHT ANTECUBITAL  Final   Special Requests   Final    BOTTLES DRAWN AEROBIC AND ANAEROBIC Blood Culture results may not be optimal due to an inadequate volume of blood received in culture bottles   Culture   Final    NO GROWTH 5 DAYS Performed at [facility], [address] [place], [place] [address]    Report Status 08/09/2020 FINAL  Final    Coagulation Studies: No results  for input(s): "LABPROT", "INR" in the last 72 hours.  Urinalysis: No results for input(s): "COLORURINE", "LABSPEC", "PHURINE", "GLUCOSEU", "HGBUR", "BILIRUBINUR", "KETONESUR", "PROTEINUR", "UROBILINOGEN", "NITRITE", "LEUKOCYTESUR" in the last 72 hours.  Invalid input(s): "APPERANCEUR"    Imaging: No results found.   Medications:     amLODipine  10 mg Oral Daily   aspirin  81 mg Oral Daily   calcium-vitamin D  1 tablet Oral Daily   carvedilol  6.25 mg Oral BID WC   dapagliflozin propanediol  10 mg Oral Daily   febuxostat  80 mg Oral Daily   fenofibrate  160 mg Oral Daily   insulin aspart  0-5 Units Subcutaneous QHS   insulin aspart  0-9 Units Subcutaneous TID WC   [START ON 12/18/2022] metolazone  2.5 mg Oral Once per day on Mon Fri   multivitamin with minerals  1 tablet Oral Daily   potassium chloride SA  40 mEq Oral TID   rosuvastatin  20 mg Oral Daily   torsemide  100 mg Oral Daily   acetaminophen **OR** acetaminophen, hydrOXYzine  Assessment/ Plan:   Russell Grant is a 52 y.o.  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 Patient presents to the emergency department at the advice of his nephrologist due to abnormal labs. Patient has been admitted for Hypokalemia [E87.6] QT prolongation [R94.31]   Acute Kidney Injury on chronic kidney disease stage IV with baseline creatinine 3.17 and GFR of 23 on 11/02/22.  Acute kidney injury secondary to aggressive diuresis with hypovolemia. 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  Creatinine slowly improving, patient has adequate urine output.  Will schedule follow-up appointment in our office.  Lab Results  Component Value Date   CREATININE 3.66 (H) 12/17/2022   CREATININE 3.82 (H) 12/16/2022   CREATININE 4.01 (H) 12/15/2022    Intake/Output Summary (Last 24 hours) at 12/17/2022 0943 Last data filed at 12/16/2022 2300 Gross per 24 hour  Intake 544.98 ml  Output 2200 ml  Net -1655.02 ml    2.  Hypokalemia secondary to aggressive diuresis.  Potassium corrected to 3.2 today.  Recommending metolazone 2.5 mg twice weekly.  Spironolactone to be considered outpatient.  No other medication changes at this time.  3. Anemia of chronic kidney disease Lab Results  Component Value Date   HGB 11.7 (L) 12/15/2022    Hemoglobin at goal.  No need for ESA's at this time.  4. 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 Home regimen includes [place]. Most recent hemoglobin A1c is 7.2 on 12/07/22.   Glucose elevated at times, sliding scale insulin managed by primary team.  5. Secondary Hyperparathyroidism: with outpatient labs: PTH 21, phosphorus 6.1, calcium 10.0 on 12/14/22.   Lab Results  Component Value Date   CALCIUM 9.0 12/16/2022   PHOS 4.8 (H) 12/15/2022   Patient receiving calcium supplementation and ergocalciferol outpatient.  Calcium and phosphorus within desired range.   LOS: 2 Ladye Macnaughton 4/11/20249:43 AM

## 2022-12-17 NOTE — Discharge Summary (Signed)
Physician Discharge Summary   Russell Grant  male DOB: 1970/12/04  ZOX:[id number]  PCP: Marina GoodellFeldpausch, Dale E, MD  Admit date: 12/15/2022 Discharge date: 12/17/2022  Admitted From: home Disposition:  home Wife updated at bedside prior to discharge. CODE STATUS: Full code   Hospital Course:  For full details, please see H&P, progress notes, consult notes and ancillary notes.  Briefly,  Russell Grant is a 52 y.o. M with obesity, CKD IV, HTN, DM who presented from Nephrology clinic with hypokalemia and worsening renal insufficiency in setting of increased diuretics in the last few months.  Pt was prescribed metolazone 2.5 mg twice weekly, and apparently pt was taking it daily.   * Hypokalemia --due to increased diuretic use above what was prescribed. --potassium level monitored and repleted PRN. --pt was discharged back on home potassium supplement 40 mEq BID.  Acute renal failure superimposed on stage 4 chronic kidney disease baseline creatinine 3.17 and GFR of 23 on 11/02/22.  On presentation, Cr 4.01.  AKI likely 2/2 overdiuresis, taking more diuretic at home than prescribed.   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 The score is based on dignosis, age, lab data, medications, orders, and past utilization.   Low:  0-14.9   Medium: 15-21.9   High: 22-29.9   Extreme: 30 and above         Discharge Instructions:  Allergies as of 12/17/2022       Reactions   Tacrolimus Hives   Other Reaction(s): Unknown   Hydrochlorothiazide Rash   Cefdinir Rash        Medication List     TAKE these medications    amLODipine 10 MG tablet Commonly known as: NORVASC Take 1 tablet (10 mg total) by mouth daily.   aspirin 81 MG chewable tablet Chew 1 tablet (81 mg total) by mouth daily.   Calcium 600-200 MG-UNIT tablet Take 1 tablet by mouth daily. Can take any over-the-counter supplement.   carvedilol 6.25 MG tablet Commonly known as: COREG Take 6.25 mg by mouth 2 (two) times daily.   dapagliflozin propanediol 10 MG Tabs tablet Commonly known as: FARXIGA Take 10 mg by mouth daily.   Febuxostat 80 MG Tabs Take 80 mg by mouth daily.   fenofibrate 145 MG tablet Commonly known as: TRICOR Take 145 mg by mouth daily.   hydrOXYzine 25 MG tablet  Commonly known as: ATARAX Take 1 tablet (25 mg total) by mouth 3 (three) times daily as needed for itching.   loratadine 10 MG tablet Commonly known as: CLARITIN Take 10 mg by mouth daily.   metolazone 5 MG tablet Commonly known as: ZAROXOLYN Take 5 mg by mouth 2 (two) times a week. Monday and Friday 30 minutes before Torsemide What changed: Another medication with the same name was removed. Continue taking this medication, and follow the directions you see here.    MULTIVITAMIN ADULTS PO Take 1 tablet by mouth daily.   omega-3 acid ethyl esters 1 g capsule Commonly known as: LOVAZA Take 2 capsules by mouth 2 (two) times daily.   potassium chloride SA 20 MEQ tablet Commonly known as: KLOR-CON M Take 2 tablets (40 mEq total) by mouth 2 (two) times daily.   predniSONE 20 MG tablet Commonly known as: DELTASONE Take 1 tablet (20 mg total) by mouth 2 (two) times daily with a meal. As needed for gout flare.  Home med. What changed: additional instructions   rosuvastatin 20 MG tablet Commonly known as: CRESTOR Take 1 tablet (20 mg total) by mouth daily.   senna-docusate 8.6-50 MG tablet Commonly known as: Senokot-S Take 1 tablet by mouth daily.   tirzepatide 7.5 MG/0.5ML Pen Commonly known as: MOUNJARO Inject 7.5 mg into the skin once a week.   torsemide 100 MG tablet Commonly known as: DEMADEX Take 100 mg by mouth daily.   Vitamin D (Ergocalciferol) 1.25 MG (50000 UNIT) Caps capsule Commonly known as: DRISDOL Take 1 capsule (50,000 Units total) by mouth every 7 (seven) days.         Follow-up Information     Lateef, Munsoor, MD Follow up in 1 week(s).   Specialty: Nephrology Contact information: [address] [place] [address] [phone]                 Allergies  Allergen Reactions   Tacrolimus Hives    Other Reaction(s): Unknown   Hydrochlorothiazide Rash   Cefdinir Rash     The results of significant diagnostics from this hospitalization (including imaging, microbiology, ancillary and laboratory) are listed below for reference.   Consultations:   Procedures/Studies: No results found.    Labs: BNP (last 3 results) Recent Labs    10/17/22 1504  BNP 770.2*   Basic Metabolic Panel: Recent Labs  Lab 12/15/22 1710 12/16/22 0500 12/16/22 1243 12/17/22 0513  NA 135 139  --   --   K 2.4* 2.4* 2.8* 3.2*  CL 90* 101  --   --   CO2 31 29  --   --   GLUCOSE 257* 153*  --   --   BUN  84* 77*  --   --   CREATININE 4.01* 3.82*  --  3.66*  CALCIUM 10.0 9.0  --   --   MG 2.3 2.1  --   --   PHOS 4.8*  --   --   --    Liver Function Tests: Recent Labs  Lab 12/15/22 1710  AST 45*  ALT 28  ALKPHOS 58  BILITOT 0.7  PROT 6.2*  ALBUMIN 2.4*   No results for input(s): "LIPASE", "AMYLASE" in the last 168 hours. No results for input(s): "AMMONIA" in the last 168 hours. CBC: Recent Labs  Lab 12/15/22 1710  WBC 13.1*  NEUTROABS 9.4*  HGB 11.7*  HCT 34.5*  MCV 87.8  PLT 292   Cardiac Enzymes: No results  for input(s): "CKTOTAL", "CKMB", "CKMBINDEX", "TROPONINI" in the last 168 hours. BNP: Invalid input(s): "POCBNP" CBG: Recent Labs  Lab 12/16/22 0802 12/16/22 1135 12/16/22 1654 12/16/22 2230 12/17/22 0742  GLUCAP 128* 226* 204* 277* 215*   D-Dimer No results for input(s): "DDIMER" in the last 72 hours. Hgb A1c Recent Labs    12/15/22 1710  HGBA1C 7.4*   Lipid Profile No results for input(s): "CHOL", "HDL", "LDLCALC", "TRIG", "CHOLHDL", "LDLDIRECT" in the last 72 hours. Thyroid function studies No results for input(s): "TSH", "T4TOTAL", "T3FREE", "THYROIDAB" in the last 72 hours.  Invalid input(s): "FREET3" Anemia work up No results for input(s): "VITAMINB12", "FOLATE", "FERRITIN", "TIBC", "IRON", "RETICCTPCT" in the last 72 hours. Urinalysis    Component Value Date/Time   COLORURINE YELLOW (A) 09/30/2022 1130   APPEARANCEUR CLEAR (A) 09/30/2022 1130   APPEARANCEUR Turbid (A) 05/02/2019 0811   LABSPEC 1.028 09/30/2022 1130   PHURINE 5.0 09/30/2022 1130   GLUCOSEU NEGATIVE 09/30/2022 1130   HGBUR NEGATIVE 09/30/2022 1130   BILIRUBINUR NEGATIVE 09/30/2022 1130   BILIRUBINUR Negative 03/06/2020 1347   BILIRUBINUR Negative 05/02/2019 0811   KETONESUR NEGATIVE 09/30/2022 1130   PROTEINUR NEGATIVE 09/30/2022 1130   UROBILINOGEN 0.2 03/06/2020 1347   NITRITE NEGATIVE 09/30/2022 1130   LEUKOCYTESUR NEGATIVE 09/30/2022 1130   Sepsis Labs Recent  Labs  Lab 12/15/22 1710  WBC 13.1*   Microbiology No results found for this or any previous visit (from the past 240 hour(s)).   Total time spend on discharging this patient, including the last patient exam, discussing the hospital stay, instructions for ongoing care as it relates to all pertinent caregivers, as well as preparing the medical discharge records, prescriptions, and/or referrals as applicable, is 45 minutes.    Darlin Priestly, MD  [facility] 12/17/2022, 9:39 AM

## 2022-12-21 ENCOUNTER — Inpatient Hospital Stay: Payer: BC Managed Care – PPO | Admitting: Internal Medicine

## 2022-12-21 ENCOUNTER — Inpatient Hospital Stay: Payer: BC Managed Care – PPO

## 2022-12-24 ENCOUNTER — Other Ambulatory Visit: Payer: Self-pay | Admitting: *Deleted

## 2022-12-24 ENCOUNTER — Ambulatory Visit (INDEPENDENT_AMBULATORY_CARE_PROVIDER_SITE_OTHER): Payer: BC Managed Care – PPO | Admitting: Vascular Surgery

## 2022-12-24 ENCOUNTER — Ambulatory Visit (INDEPENDENT_AMBULATORY_CARE_PROVIDER_SITE_OTHER): Payer: Self-pay

## 2022-12-24 ENCOUNTER — Encounter (INDEPENDENT_AMBULATORY_CARE_PROVIDER_SITE_OTHER): Payer: Self-pay | Admitting: Vascular Surgery

## 2022-12-24 VITALS — BP 140/75 | HR 77 | Resp 16 | Ht 69.25 in | Wt 288.2 lb

## 2022-12-24 DIAGNOSIS — N184 Chronic kidney disease, stage 4 (severe): Secondary | ICD-10-CM

## 2022-12-24 DIAGNOSIS — E1129 Type 2 diabetes mellitus with other diabetic kidney complication: Secondary | ICD-10-CM | POA: Diagnosis not present

## 2022-12-24 DIAGNOSIS — N049 Nephrotic syndrome with unspecified morphologic changes: Secondary | ICD-10-CM

## 2022-12-24 DIAGNOSIS — N186 End stage renal disease: Secondary | ICD-10-CM

## 2022-12-24 DIAGNOSIS — I10 Essential (primary) hypertension: Secondary | ICD-10-CM | POA: Diagnosis not present

## 2022-12-24 DIAGNOSIS — E781 Pure hyperglyceridemia: Secondary | ICD-10-CM | POA: Diagnosis not present
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
## 2022-12-25 ENCOUNTER — Encounter (INDEPENDENT_AMBULATORY_CARE_PROVIDER_SITE_OTHER): Payer: Self-pay | Admitting: Vascular Surgery

## 2022-12-28 ENCOUNTER — Inpatient Hospital Stay: Payer: BC Managed Care – PPO | Attending: Internal Medicine | Admitting: Internal Medicine

## 2022-12-28 ENCOUNTER — Encounter: Payer: Self-pay | Admitting: Internal Medicine

## 2022-12-28 ENCOUNTER — Inpatient Hospital Stay: Payer: BC Managed Care – PPO

## 2022-12-28 DIAGNOSIS — N032 Chronic nephritic syndrome with diffuse membranous glomerulonephritis: Secondary | ICD-10-CM

## 2022-12-28 DIAGNOSIS — D72829 Elevated white blood cell count, unspecified: Secondary | ICD-10-CM | POA: Diagnosis not present

## 2022-12-28 DIAGNOSIS — N184 Chronic kidney disease, stage 4 (severe): Secondary | ICD-10-CM | POA: Insufficient documentation

## 2022-12-28 DIAGNOSIS — N041 Nephrotic syndrome with focal and segmental glomerular lesions: Secondary | ICD-10-CM | POA: Diagnosis present

## 2022-12-28 DIAGNOSIS — E1122 Type 2 diabetes mellitus with diabetic chronic kidney disease: Secondary | ICD-10-CM | POA: Diagnosis not present

## 2022-12-28 DIAGNOSIS — N049 Nephrotic syndrome with unspecified morphologic changes: Secondary | ICD-10-CM

## 2022-12-28 LAB — CMP (CANCER CENTER ONLY)
ALT: 23 U/L (ref 0–44)
AST: 23 U/L (ref 15–41)
Albumin: 2.4 g/dL — ABNORMAL LOW (ref 3.5–5.0)
Alkaline Phosphatase: 48 U/L (ref 38–126)
Anion gap: 10 (ref 5–15)
BUN: 49 mg/dL — ABNORMAL HIGH (ref 6–20)
CO2: 24 mmol/L (ref 22–32)
Calcium: 11.2 mg/dL — ABNORMAL HIGH (ref 8.9–10.3)
Chloride: 104 mmol/L (ref 98–111)
Creatinine: 3.19 mg/dL — ABNORMAL HIGH (ref 0.61–1.24)
GFR, Estimated: 22 mL/min — ABNORMAL LOW (ref >60)
Glucose, Bld: 118 mg/dL — ABNORMAL HIGH (ref 70–99)
Potassium: 4 mmol/L (ref 3.5–5.1)
Sodium: 138 mmol/L (ref 135–145)
Total Bilirubin: 0.2 mg/dL — ABNORMAL LOW (ref 0.3–1.2)
Total Protein: 5.9 g/dL — ABNORMAL LOW (ref 6.5–8.1)

## 2022-12-28 LAB — CBC WITH DIFFERENTIAL (CANCER CENTER ONLY)
Abs Immature Granulocytes: 0.06 10*3/uL (ref 0.00–0.07)
Basophils Absolute: 0.1 10*3/uL (ref 0.0–0.1)
Basophils Relative: 1 %
Eosinophils Absolute: 0.5 10*3/uL (ref 0.0–0.5)
Eosinophils Relative: 4 %
HCT: 37.7 % — ABNORMAL LOW (ref 39.0–52.0)
Hemoglobin: 12.3 g/dL — ABNORMAL LOW (ref 13.0–17.0)
Immature Granulocytes: 1 %
Lymphocytes Relative: 17 %
Lymphs Abs: 1.9 10*3/uL (ref 0.7–4.0)
MCH: 29.9 pg (ref 26.0–34.0)
MCHC: 32.6 g/dL (ref 30.0–36.0)
MCV: 91.7 fL (ref 80.0–100.0)
Monocytes Absolute: 0.8 10*3/uL (ref 0.1–1.0)
Monocytes Relative: 7 %
Neutro Abs: 7.7 10*3/uL (ref 1.7–7.7)
Neutrophils Relative %: 70 %
Platelet Count: 293 10*3/uL (ref 150–400)
RBC: 4.11 MIL/uL — ABNORMAL LOW (ref 4.22–5.81)
RDW: 13.8 % (ref 11.5–15.5)
WBC Count: 10.9 10*3/uL — ABNORMAL HIGH (ref 4.0–10.5)
nRBC: 0 % (ref 0.0–0.2)
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
## 2022-12-28 NOTE — Assessment & Plan Note (Addendum)
#  Focal segmental glomera sclerosis-failed steroids.  As per nephrology currently s/p  rituximab 370 mg/m infusion weekly x4.   [finished march 2023]- however, worsened- currently on stage IV CKD; on transplant list. [Dr.Lateef].  # Leucocytosis:/neutrophilia-white count is 10.9.  Normal differential.- sec to reactive; discussed with the patient low concerns for any malignancy.  Would hold off any further blood or bone marrow biopsy/imaging workup for leukemia/malignancy.  Discussed with the patient that we will be happy to see the patient sooner and workup if needed.  Informed Dr. Cherylann Ratel.  # Diabetes/ Blood glucose [[facility], in [place]]- stable; defer to PCP  # DISPOSITION: # follow up as needed- Dr.B

## 2023-01-12 ENCOUNTER — Telehealth (INDEPENDENT_AMBULATORY_CARE_PROVIDER_SITE_OTHER): Payer: Self-pay

## 2023-01-12 NOTE — Telephone Encounter (Signed)
Spoke with the patient and he is scheduled with Dr. Gilda Crease for a left brachialcephalic fistula on 01/15/23 at [facility]. Pre-op appt is on 01/14/23 at 8:00 am. Pre-surgical instructions were discussed and patient stated he understood. This will be sent to his Mychart.

## 2023-01-13 ENCOUNTER — Other Ambulatory Visit (INDEPENDENT_AMBULATORY_CARE_PROVIDER_SITE_OTHER): Payer: Self-pay | Admitting: Nurse Practitioner

## 2023-01-13 DIAGNOSIS — N186 End stage renal disease: Secondary | ICD-10-CM

## 2023-01-14 ENCOUNTER — Other Ambulatory Visit: Payer: Self-pay

## 2023-01-14 ENCOUNTER — Encounter
Admission: RE | Admit: 2023-01-14 | Discharge: 2023-01-14 | Disposition: A | Discharge status: Home / Self Care | Payer: BC Managed Care – PPO | Source: Ambulatory Visit | Attending: Vascular Surgery | Admitting: Vascular Surgery

## 2023-01-14 VITALS — BP 131/68 | HR 72 | Resp 15 | Ht 70.0 in | Wt 289.0 lb

## 2023-01-14 DIAGNOSIS — Z01818 Encounter for other preprocedural examination: Secondary | ICD-10-CM | POA: Insufficient documentation

## 2023-01-14 DIAGNOSIS — Z01810 Encounter for preprocedural cardiovascular examination: Secondary | ICD-10-CM

## 2023-01-14 DIAGNOSIS — I120 Hypertensive chronic kidney disease with stage 5 chronic kidney disease or end stage renal disease: Secondary | ICD-10-CM | POA: Diagnosis not present

## 2023-01-14 DIAGNOSIS — E1129 Type 2 diabetes mellitus with other diabetic kidney complication: Secondary | ICD-10-CM | POA: Insufficient documentation

## 2023-01-14 DIAGNOSIS — N186 End stage renal disease: Secondary | ICD-10-CM | POA: Insufficient documentation

## 2023-01-14 DIAGNOSIS — Z79899 Other long term (current) drug therapy: Secondary | ICD-10-CM | POA: Diagnosis not present

## 2023-01-14 DIAGNOSIS — I4510 Unspecified right bundle-branch block: Secondary | ICD-10-CM | POA: Insufficient documentation

## 2023-01-14 DIAGNOSIS — E876 Hypokalemia: Secondary | ICD-10-CM | POA: Insufficient documentation

## 2023-01-14 DIAGNOSIS — Z6841 Body Mass Index (BMI) 40.0 and over, adult: Secondary | ICD-10-CM | POA: Diagnosis not present

## 2023-01-14 DIAGNOSIS — E1122 Type 2 diabetes mellitus with diabetic chronic kidney disease: Secondary | ICD-10-CM | POA: Diagnosis not present

## 2023-01-14 DIAGNOSIS — N184 Chronic kidney disease, stage 4 (severe): Secondary | ICD-10-CM

## 2023-01-14 DIAGNOSIS — E781 Pure hyperglyceridemia: Secondary | ICD-10-CM | POA: Diagnosis not present

## 2023-01-14 DIAGNOSIS — N179 Acute kidney failure, unspecified: Secondary | ICD-10-CM | POA: Insufficient documentation

## 2023-01-14 HISTORY — DX: Chronic kidney disease, stage 4 (severe): N18.4

## 2023-01-14 HISTORY — DX: Type 2 diabetes mellitus without complications: E11.9

## 2023-01-14 HISTORY — DX: Sleep apnea, unspecified: G47.30

## 2023-01-14 LAB — BASIC METABOLIC PANEL
Anion gap: 9 (ref 5–15)
BUN: 54 mg/dL — ABNORMAL HIGH (ref 6–20)
CO2: 23 mmol/L (ref 22–32)
Calcium: 10.5 mg/dL — ABNORMAL HIGH (ref 8.9–10.3)
Chloride: 108 mmol/L (ref 98–111)
Creatinine, Ser: 3.73 mg/dL — ABNORMAL HIGH (ref 0.61–1.24)
GFR, Estimated: 19 mL/min — ABNORMAL LOW (ref >60)
Glucose, Bld: 109 mg/dL — ABNORMAL HIGH (ref 70–99)
Potassium: 3.6 mmol/L (ref 3.5–5.1)
Sodium: 140 mmol/L (ref 135–145)

## 2023-01-14 LAB — CBC WITH DIFFERENTIAL/PLATELET
Abs Immature Granulocytes: 0.04 10*3/uL (ref 0.00–0.07)
Basophils Absolute: 0.1 10*3/uL (ref 0.0–0.1)
Basophils Relative: 1 %
Eosinophils Absolute: 0.6 10*3/uL — ABNORMAL HIGH (ref 0.0–0.5)
Eosinophils Relative: 6 %
HCT: 34.6 % — ABNORMAL LOW (ref 39.0–52.0)
Hemoglobin: 11.2 g/dL — ABNORMAL LOW (ref 13.0–17.0)
Immature Granulocytes: 0 %
Lymphocytes Relative: 21 %
Lymphs Abs: 1.9 10*3/uL (ref 0.7–4.0)
MCH: 29.1 pg (ref 26.0–34.0)
MCHC: 32.4 g/dL (ref 30.0–36.0)
MCV: 89.9 fL (ref 80.0–100.0)
Monocytes Absolute: 0.8 10*3/uL (ref 0.1–1.0)
Monocytes Relative: 9 %
Neutro Abs: 6 10*3/uL (ref 1.7–7.7)
Neutrophils Relative %: 63 %
Platelets: 254 10*3/uL (ref 150–400)
RBC: 3.85 MIL/uL — ABNORMAL LOW (ref 4.22–5.81)
RDW: 13.2 % (ref 11.5–15.5)
WBC: 9.3 10*3/uL (ref 4.0–10.5)
nRBC: 0 % (ref 0.0–0.2)

## 2023-01-14 LAB — TYPE AND SCREEN
ABO/RH(D): O POS
Antibody Screen: NEGATIVE

## 2023-01-14 MED ORDER — FAMOTIDINE 20 MG PO TABS
20.0000 mg | ORAL_TABLET | Freq: Once | ORAL | Status: AC
  Administered 2023-01-15: 20 mg via ORAL

## 2023-01-14 MED ORDER — ORAL CARE MOUTH RINSE: 15.0000 mL | Freq: Once | OROMUCOSAL | Status: AC

## 2023-01-14 MED ORDER — VANCOMYCIN HCL 1500 MG/300ML IV SOLN
1500.0000 mg | INTRAVENOUS | Status: AC
  Administered 2023-01-15: 1500 mg via INTRAVENOUS
  Filled 2023-01-14 (×2): qty 300

## 2023-01-14 MED ORDER — CHLORHEXIDINE GLUCONATE CLOTH 2 % EX PADS: 6.0000 | MEDICATED_PAD | Freq: Once | CUTANEOUS | Status: DC

## 2023-01-14 MED ORDER — CHLORHEXIDINE GLUCONATE CLOTH 2 % EX PADS
6.0000 | MEDICATED_PAD | Freq: Once | CUTANEOUS | Status: AC
  Administered 2023-01-14: 6 via TOPICAL

## 2023-01-14 MED ORDER — SODIUM CHLORIDE 0.9 % IV SOLN: INTRAVENOUS | Status: DC

## 2023-01-14 MED ORDER — CHLORHEXIDINE GLUCONATE 0.12 % MT SOLN
15.0000 mL | Freq: Once | OROMUCOSAL | Status: AC
  Administered 2023-01-15: 15 mL via OROMUCOSAL

## 2023-01-14 NOTE — Patient Instructions (Addendum)
Your procedure is scheduled on: Friday 01/15/23 To find out your arrival time, please call [phone] between 1PM - 3PM on:   Today 01/14/23 Report to the Registration Desk on the 1st floor of the [facility]. Valet parking is available.  If your arrival time is 6:00 am, do not arrive before that time as the [facility] entrance doors do not open until 6:00 am.  REMEMBER: Instructions that are not followed completely may result in serious medical risk, up to and including death; or upon the discretion of your surgeon and anesthesiologist your surgery may need to be rescheduled.  Do not eat food or drink any liquids after midnight the night before surgery except water with medications.  No gum chewing or hard candies.  One week prior to surgery: Stop Anti-inflammatories (NSAIDS) such as Advil, Aleve, Ibuprofen, Motrin, Naproxen, Naprosyn and Aspirin based products such as Excedrin, Goody's Powder, BC Powder. You may however, continue to take Tylenol if needed for pain up until the day of surgery.  Stop ANY OVER THE COUNTER supplements until after surgery.  Continue taking all prescribed medications with the exception of the following:  Follow recommendations from Cardiologist or PCP regarding stopping blood thinners. Hold Aspirin Friday 01/15/23  TAKE ONLY THESE MEDICATIONS THE MORNING OF SURGERY WITH A SIP OF WATER:  amLODipine (NORVASC) 10 MG tablet  carvedilol (COREG) 6.25 MG tablet  Febuxostat 80 MG TABS  metolazone (ZAROXOLYN) 2.5 MG tablet  hydrOXYzine (ATARAX) 25 MG tablet if needed loratadine (CLARITIN) 10 MG tablet if needed  No Alcohol for 24 hours before or after surgery.  No Smoking including e-cigarettes for 24 hours before surgery.  No chewable tobacco products for at least 6 hours before surgery.  No nicotine patches on the day of surgery.  Do not use any "recreational" drugs for at least a week (preferably 2 weeks) before your surgery.  Please be advised that  the combination of cocaine and anesthesia may have negative outcomes, up to and including death. If you test positive for cocaine, your surgery will be cancelled.  On the morning of surgery brush your teeth with toothpaste and water, you may rinse your mouth with mouthwash if you wish. Do not swallow any toothpaste or mouthwash.  Use CHG Soap or wipes as directed on instruction sheet.  Do not wear lotions, powders, or perfumes. NO DEODORANT  Do not shave body hair from the neck down 48 hours before surgery.  Wear comfortable clothing (specific to your surgery type) to the hospital.  Do not wear jewelry, make-up, hairpins, clips or nail polish.  Contact lenses, hearing aids and dentures may not be worn into surgery.  Bring your C-PAP to the hospital in case you may have to spend the night.   Do not bring valuables to the hospital. [facility] is not responsible for any missing/lost belongings or valuables.   Notify your doctor if there is any change in your medical condition (cold, fever, infection).  If you are being discharged the day of surgery, you will not be allowed to drive home. You will need a responsible individual to drive you home and stay with you for 24 hours after surgery.   If you are taking public transportation, you will need to have a responsible individual with you.  If you are being admitted to the hospital overnight, leave your suitcase in the car. After surgery it may be brought to your room.  In case of increased patient census, it may be necessary  for you, the patient, to continue your postoperative care in the Same Day Surgery department.  After surgery, you can help prevent lung complications by doing breathing exercises.  Take deep breaths and cough every 1-2 hours. Your doctor may order a device called an Incentive Spirometer to help you take deep breaths. When coughing or sneezing, hold a pillow firmly against your incision with both hands. This is  called "splinting." Doing this helps protect your incision. It also decreases belly discomfort.  Surgery Visitation Policy:  Patients undergoing a surgery or procedure may have two family members or support persons with them as long as the person is not COVID-19 positive or experiencing its symptoms.   Inpatient Visitation:    Visiting hours are 7 a.m. to 8 p.m. Up to four visitors are allowed at one time in a patient room. The visitors may rotate out with other people during the day. One designated support person (adult) may remain overnight.  Due to an increase in RSV and influenza rates and associated hospitalizations, children ages 59 and under will not be able to visit patients in [facility]. Masks continue to be strongly recommended.  Please call the Pre-admissions Testing Dept. at [phone] if you have any questions about these instructions.     Preparing for Surgery with CHLORHEXIDINE GLUCONATE (CHG) Soap  Chlorhexidine Gluconate (CHG) Soap  o An antiseptic cleaner that kills germs and bonds with the skin to continue killing germs even after washing  o Used for showering the night before surgery and morning of surgery  Before surgery, you can play an important role by reducing the number of germs on your skin.  CHG (Chlorhexidine gluconate) soap is an antiseptic cleanser which kills germs and bonds with the skin to continue killing germs even after washing.  Please do not use if you have an allergy to CHG or antibacterial soaps. If your skin becomes reddened/irritated stop using the CHG.  1. Shower the NIGHT BEFORE SURGERY and the MORNING OF SURGERY with CHG soap.  2. If you choose to wash your hair, wash your hair first as usual with your normal shampoo.  3. After shampooing, rinse your hair and body thoroughly to remove the shampoo.  4. Use CHG as you would any other liquid soap. You can apply CHG directly to the skin and wash gently with a scrungie or  a clean washcloth.  5. Apply the CHG soap to your body only from the neck down. Do not use on open wounds or open sores. Avoid contact with your eyes, ears, mouth, and genitals (private parts). Wash face and genitals (private parts) with your normal soap.  6. Wash thoroughly, paying special attention to the area where your surgery will be performed.  7. Thoroughly rinse your body with warm water.  8. Do not shower/wash with your normal soap after using and rinsing off the CHG soap.  9. Pat yourself dry with a clean towel.  10. Wear clean pajamas to bed the night before surgery.  12. Place clean sheets on your bed the night of your first shower and do not sleep with pets.  13. Shower again with the CHG soap on the day of surgery prior to arriving at the hospital.  14. Do not apply any deodorants/lotions/powders.  15. Please wear clean clothes to the hospital.

## 2023-01-15 ENCOUNTER — Encounter: Payer: Self-pay | Admitting: Vascular Surgery

## 2023-01-15 ENCOUNTER — Encounter
Admission: RE | Disposition: A | Discharge status: Home / Self Care | Payer: Self-pay | Source: Home / Self Care | Attending: Vascular Surgery

## 2023-01-15 ENCOUNTER — Other Ambulatory Visit: Payer: Self-pay

## 2023-01-15 ENCOUNTER — Ambulatory Visit
Admission: RE | Admit: 2023-01-15 | Discharge: 2023-01-15 | Disposition: A | Discharge status: Home / Self Care | Payer: BC Managed Care – PPO | Attending: Vascular Surgery | Admitting: Vascular Surgery

## 2023-01-15 ENCOUNTER — Ambulatory Visit: Payer: BC Managed Care – PPO | Admitting: Anesthesiology

## 2023-01-15 ENCOUNTER — Ambulatory Visit: Payer: BC Managed Care – PPO | Admitting: Urgent Care

## 2023-01-15 DIAGNOSIS — N184 Chronic kidney disease, stage 4 (severe): Secondary | ICD-10-CM

## 2023-01-15 DIAGNOSIS — N179 Acute kidney failure, unspecified: Secondary | ICD-10-CM | POA: Insufficient documentation

## 2023-01-15 DIAGNOSIS — E1129 Type 2 diabetes mellitus with other diabetic kidney complication: Secondary | ICD-10-CM

## 2023-01-15 DIAGNOSIS — E876 Hypokalemia: Secondary | ICD-10-CM

## 2023-01-15 DIAGNOSIS — E781 Pure hyperglyceridemia: Secondary | ICD-10-CM | POA: Insufficient documentation

## 2023-01-15 DIAGNOSIS — I4510 Unspecified right bundle-branch block: Secondary | ICD-10-CM | POA: Insufficient documentation

## 2023-01-15 DIAGNOSIS — N186 End stage renal disease: Secondary | ICD-10-CM | POA: Diagnosis not present

## 2023-01-15 DIAGNOSIS — I120 Hypertensive chronic kidney disease with stage 5 chronic kidney disease or end stage renal disease: Secondary | ICD-10-CM | POA: Diagnosis not present

## 2023-01-15 DIAGNOSIS — Z6841 Body Mass Index (BMI) 40.0 and over, adult: Secondary | ICD-10-CM | POA: Insufficient documentation

## 2023-01-15 DIAGNOSIS — Z79899 Other long term (current) drug therapy: Secondary | ICD-10-CM | POA: Insufficient documentation

## 2023-01-15 DIAGNOSIS — E1122 Type 2 diabetes mellitus with diabetic chronic kidney disease: Secondary | ICD-10-CM | POA: Insufficient documentation

## 2023-01-15 HISTORY — PX: AV FISTULA PLACEMENT: SHX1204

## 2023-01-15 LAB — GLUCOSE, CAPILLARY: Glucose-Capillary: 123 mg/dL — ABNORMAL HIGH (ref 70–99)

## 2023-01-15 LAB — ABO/RH: ABO/RH(D): O POS

## 2023-01-15 SURGERY — ARTERIOVENOUS (AV) FISTULA CREATION
Anesthesia: General | Site: Arm Upper | Laterality: Left

## 2023-01-15 MED ORDER — BUPIVACAINE HCL (PF) 0.5 % IJ SOLN
INTRAMUSCULAR | Status: AC
  Filled 2023-01-15: qty 30

## 2023-01-15 MED ORDER — ACETAMINOPHEN 10 MG/ML IV SOLN
INTRAVENOUS | Status: AC
  Filled 2023-01-15: qty 100

## 2023-01-15 MED ORDER — ACETAMINOPHEN 10 MG/ML IV SOLN: 1000.0000 mg | Freq: Once | INTRAVENOUS | Status: DC | PRN

## 2023-01-15 MED ORDER — KETAMINE HCL 50 MG/5ML IJ SOSY
PREFILLED_SYRINGE | INTRAMUSCULAR | Status: AC
  Filled 2023-01-15: qty 5

## 2023-01-15 MED ORDER — MIDAZOLAM HCL 2 MG/2ML IJ SOLN
INTRAMUSCULAR | Status: AC
  Filled 2023-01-15: qty 2

## 2023-01-15 MED ORDER — ONDANSETRON HCL 4 MG/2ML IJ SOLN
INTRAMUSCULAR | Status: DC | PRN
  Administered 2023-01-15: 4 mg via INTRAVENOUS

## 2023-01-15 MED ORDER — FENTANYL CITRATE (PF) 100 MCG/2ML IJ SOLN
INTRAMUSCULAR | Status: AC
  Filled 2023-01-15: qty 2

## 2023-01-15 MED ORDER — LACTATED RINGERS IV SOLN: INTRAVENOUS | Status: DC | PRN

## 2023-01-15 MED ORDER — KETAMINE HCL 10 MG/ML IJ SOLN
INTRAMUSCULAR | Status: DC | PRN
  Administered 2023-01-15: 30 mg via INTRAVENOUS

## 2023-01-15 MED ORDER — OXYCODONE HCL 5 MG PO TABS: 5.0000 mg | ORAL_TABLET | Freq: Once | ORAL | Status: DC | PRN

## 2023-01-15 MED ORDER — ACETAMINOPHEN 10 MG/ML IV SOLN
INTRAVENOUS | Status: DC | PRN
  Administered 2023-01-15: 1000 mg via INTRAVENOUS

## 2023-01-15 MED ORDER — PROPOFOL 10 MG/ML IV BOLUS
INTRAVENOUS | Status: DC | PRN
  Administered 2023-01-15: 180 mg via INTRAVENOUS

## 2023-01-15 MED ORDER — CHLORHEXIDINE GLUCONATE 0.12 % MT SOLN
OROMUCOSAL | Status: AC
  Filled 2023-01-15: qty 15

## 2023-01-15 MED ORDER — FENTANYL CITRATE (PF) 100 MCG/2ML IJ SOLN: 25.0000 ug | INTRAMUSCULAR | Status: DC | PRN

## 2023-01-15 MED ORDER — EPHEDRINE SULFATE (PRESSORS) 50 MG/ML IJ SOLN
INTRAMUSCULAR | Status: DC | PRN
  Administered 2023-01-15: 10 mg via INTRAVENOUS

## 2023-01-15 MED ORDER — BUPIVACAINE LIPOSOME 1.3 % IJ SUSP
INTRAMUSCULAR | Status: AC
  Filled 2023-01-15: qty 20

## 2023-01-15 MED ORDER — LIDOCAINE HCL (CARDIAC) PF 100 MG/5ML IV SOSY
PREFILLED_SYRINGE | INTRAVENOUS | Status: DC | PRN
  Administered 2023-01-15: 100 mg via INTRAVENOUS

## 2023-01-15 MED ORDER — SODIUM CHLORIDE 0.9 % IR SOLN: Status: DC | PRN

## 2023-01-15 MED ORDER — ONDANSETRON HCL 4 MG/2ML IJ SOLN: 4.0000 mg | Freq: Once | INTRAMUSCULAR | Status: DC | PRN

## 2023-01-15 MED ORDER — SUGAMMADEX SODIUM 200 MG/2ML IV SOLN
INTRAVENOUS | Status: DC | PRN
  Administered 2023-01-15: 200 mg via INTRAVENOUS

## 2023-01-15 MED ORDER — HYDROMORPHONE HCL 1 MG/ML IJ SOLN: 1.0000 mg | Freq: Once | INTRAMUSCULAR | Status: DC | PRN

## 2023-01-15 MED ORDER — DEXAMETHASONE SODIUM PHOSPHATE 10 MG/ML IJ SOLN
INTRAMUSCULAR | Status: DC | PRN
  Administered 2023-01-15: 10 mg via INTRAVENOUS

## 2023-01-15 MED ORDER — FENTANYL CITRATE (PF) 100 MCG/2ML IJ SOLN
INTRAMUSCULAR | Status: DC | PRN
  Administered 2023-01-15 (×2): 50 ug via INTRAVENOUS

## 2023-01-15 MED ORDER — MIDAZOLAM HCL 2 MG/2ML IJ SOLN
INTRAMUSCULAR | Status: DC | PRN
  Administered 2023-01-15: 2 mg via INTRAVENOUS

## 2023-01-15 MED ORDER — BUPIVACAINE LIPOSOME 1.3 % IJ SUSP
INTRAMUSCULAR | Status: DC | PRN
  Administered 2023-01-15: 50 mL via INTRAMUSCULAR

## 2023-01-15 MED ORDER — ROCURONIUM BROMIDE 100 MG/10ML IV SOLN
INTRAVENOUS | Status: DC | PRN
  Administered 2023-01-15: 20 mg via INTRAVENOUS

## 2023-01-15 MED ORDER — FAMOTIDINE 20 MG PO TABS
ORAL_TABLET | ORAL | Status: AC
  Filled 2023-01-15: qty 1

## 2023-01-15 MED ORDER — HYDROCODONE-ACETAMINOPHEN 5-325 MG PO TABS: 1.0000 | ORAL_TABLET | Freq: Four times a day (QID) | ORAL | 0 refills | Status: AC | PRN

## 2023-01-15 MED ORDER — ONDANSETRON HCL 4 MG/2ML IJ SOLN: 4.0000 mg | Freq: Four times a day (QID) | INTRAMUSCULAR | Status: DC | PRN

## 2023-01-15 MED ORDER — LACTATED RINGERS IV SOLN: INTRAVENOUS | Status: DC

## 2023-01-15 MED ORDER — PHENYLEPHRINE HCL-NACL 20-0.9 MG/250ML-% IV SOLN
INTRAVENOUS | Status: DC | PRN
  Administered 2023-01-15: 20 ug/min via INTRAVENOUS

## 2023-01-15 MED ORDER — HEPARIN SODIUM (PORCINE) 5000 UNIT/ML IJ SOLN
INTRAMUSCULAR | Status: AC
  Filled 2023-01-15: qty 1

## 2023-01-15 MED ORDER — PHENYLEPHRINE 80 MCG/ML (10ML) SYRINGE FOR IV PUSH (FOR BLOOD PRESSURE SUPPORT)
PREFILLED_SYRINGE | INTRAVENOUS | Status: DC | PRN
  Administered 2023-01-15 (×4): 80 ug via INTRAVENOUS

## 2023-01-15 MED ORDER — HEMOSTATIC AGENTS (NO CHARGE) OPTIME
TOPICAL | Status: DC | PRN
  Administered 2023-01-15: 1 via TOPICAL

## 2023-01-15 MED ORDER — OXYCODONE HCL 5 MG/5ML PO SOLN: 5.0000 mg | Freq: Once | ORAL | Status: DC | PRN

## 2023-01-15 SURGICAL SUPPLY — 69 items
ADH SKN CLS APL DERMABOND .7 (GAUZE/BANDAGES/DRESSINGS) ×1
AGENT HMST PWDR HDRLZ BVN CLGN (Miscellaneous) ×1 IMPLANT
APL PRP STRL LF DISP 70% ISPRP (MISCELLANEOUS) ×1
APPLIER CLIP 11 MED OPEN (CLIP)
APPLIER CLIP 9.375 SM OPEN (CLIP)
APR CLP MED 11 20 MLT OPN (CLIP)
APR CLP SM 9.3 20 MLT OPN (CLIP)
BAG DECANTER FOR FLEXI CONT (MISCELLANEOUS) ×1 IMPLANT
BLADE SURG SZ11 CARB STEEL (BLADE) ×1 IMPLANT
BOOT SUTURE VASCULAR YLW (MISCELLANEOUS) ×1
BRUSH SCRUB EZ  4% CHG (MISCELLANEOUS) ×1
BRUSH SCRUB EZ 4% CHG (MISCELLANEOUS) ×1 IMPLANT
CHLORAPREP W/TINT 26 (MISCELLANEOUS) ×1 IMPLANT
CLAMP SUTURE YELLOW 5 PAIRS (MISCELLANEOUS) ×1 IMPLANT
CLIP APPLIE 11 MED OPEN (CLIP) IMPLANT
CLIP APPLIE 9.375 SM OPEN (CLIP) IMPLANT
COLLAGEN CELLERATERX 1 GRAM (Miscellaneous) IMPLANT
COVER LIGHT HANDLE STERIS (MISCELLANEOUS) IMPLANT
DERMABOND ADVANCED .7 DNX12 (GAUZE/BANDAGES/DRESSINGS) ×1 IMPLANT
DRAPE EXTREMITY 106X87X128.5 (DRAPES) IMPLANT
DRAPE IMP U-DRAPE 54X76 (DRAPES) IMPLANT
DRAPE SHEET LG 3/4 BI-LAMINATE (DRAPES) IMPLANT
DRESSING SURGICEL FIBRLLR 1X2 (HEMOSTASIS) ×1 IMPLANT
DRSG SURGICEL FIBRILLAR 1X2 (HEMOSTASIS) ×1
ELECT CAUTERY BLADE 6.4 (BLADE) ×1 IMPLANT
ELECT REM PT RETURN 9FT ADLT (ELECTROSURGICAL) ×1
ELECTRODE REM PT RTRN 9FT ADLT (ELECTROSURGICAL) ×1 IMPLANT
GEL ULTRASOUND 20GR AQUASONIC (MISCELLANEOUS) IMPLANT
GLOVE BIO SURGEON STRL SZ7 (GLOVE) ×1 IMPLANT
GLOVE SURG SYN 8.0 (GLOVE) ×1 IMPLANT
GLOVE SURG SYN 8.0 PF PI (GLOVE) ×1 IMPLANT
GOWN STRL REUS W/ TWL LRG LVL3 (GOWN DISPOSABLE) ×2 IMPLANT
GOWN STRL REUS W/ TWL XL LVL3 (GOWN DISPOSABLE) ×1 IMPLANT
GOWN STRL REUS W/TWL LRG LVL3 (GOWN DISPOSABLE) ×2
GOWN STRL REUS W/TWL XL LVL3 (GOWN DISPOSABLE) ×1
IV NS 500ML (IV SOLUTION) ×1
IV NS 500ML BAXH (IV SOLUTION) ×1 IMPLANT
KIT TURNOVER KIT A (KITS) ×1 IMPLANT
LABEL OR SOLS (LABEL) ×1 IMPLANT
LOOP VESSEL MAXI  1X406 RED (MISCELLANEOUS) ×1
LOOP VESSEL MAXI 1X406 RED (MISCELLANEOUS) ×1 IMPLANT
LOOP VESSEL MINI 0.8X406 BLUE (MISCELLANEOUS) ×2 IMPLANT
MANIFOLD NEPTUNE II (INSTRUMENTS) ×1 IMPLANT
NDL FILTER BLUNT 18X1 1/2 (NEEDLE) ×1 IMPLANT
NDL HYPO 30X.5 LL (NEEDLE) IMPLANT
NEEDLE FILTER BLUNT 18X1 1/2 (NEEDLE) ×1 IMPLANT
NEEDLE HYPO 30X.5 LL (NEEDLE) IMPLANT
PACK EXTREMITY ARMC (MISCELLANEOUS) ×1 IMPLANT
PAD PREP OB/GYN DISP 24X41 (PERSONAL CARE ITEMS) ×1 IMPLANT
PENCIL SMOKE EVACUATOR (MISCELLANEOUS) IMPLANT
STOCKINETTE 48X4 2 PLY STRL (GAUZE/BANDAGES/DRESSINGS) ×1 IMPLANT
STOCKINETTE STRL 4IN 9604848 (GAUZE/BANDAGES/DRESSINGS) ×1 IMPLANT
STOCKINETTE STRL 6IN 960660 (GAUZE/BANDAGES/DRESSINGS) IMPLANT
SUT MNCRL+ 5-0 UNDYED PC-3 (SUTURE) ×1 IMPLANT
SUT PROLENE 6 0 BV (SUTURE) ×4 IMPLANT
SUT SILK 2 0 (SUTURE) ×1
SUT SILK 2-0 18XBRD TIE 12 (SUTURE) ×1 IMPLANT
SUT SILK 3 0 (SUTURE) ×1
SUT SILK 3-0 18XBRD TIE 12 (SUTURE) ×1 IMPLANT
SUT SILK 4 0 (SUTURE) ×1
SUT SILK 4-0 18XBRD TIE 12 (SUTURE) ×1 IMPLANT
SUT VIC AB 3-0 SH 27 (SUTURE) ×1
SUT VIC AB 3-0 SH 27X BRD (SUTURE) ×1 IMPLANT
SYR 20ML LL LF (SYRINGE) ×1 IMPLANT
SYR 3ML LL SCALE MARK (SYRINGE) ×1 IMPLANT
TAG SUTURE CLAMP YLW 5PR (MISCELLANEOUS) ×1
TRAP FLUID SMOKE EVACUATOR (MISCELLANEOUS) ×1 IMPLANT
TUBING CONNECTING 10 (TUBING) IMPLANT
WATER STERILE IRR 500ML POUR (IV SOLUTION) ×1 IMPLANT

## 2023-01-15 NOTE — Anesthesia Preprocedure Evaluation (Addendum)
Anesthesia Evaluation  Patient identified by MRN, date of birth, ID band Patient awake    Reviewed: Allergy & Precautions, NPO status , Patient's Chart, lab work & pertinent test results  History of Anesthesia Complications Negative for: history of anesthetic complications  Airway Mallampati: IV   Neck ROM: Full    Dental  (+) Missing   Pulmonary sleep apnea    Pulmonary exam normal breath sounds clear to auscultation       Cardiovascular hypertension, Normal cardiovascular exam Rhythm:Regular Rate:Normal  ECG 01/14/23:  Normal sinus rhythm Right bundle branch block   Neuro/Psych negative neurological ROS     GI/Hepatic negative GI ROS,,,  Endo/Other  diabetes, Type 2  Class 3 obesity  Renal/GU Renal disease (stage IV CKD; FSGS)     Musculoskeletal   Abdominal   Peds  Hematology negative hematology ROS (+)   Anesthesia Other Findings Last dose of Mounjaro 01/09/23.  Reproductive/Obstetrics                             Anesthesia Physical Anesthesia Plan  ASA: 3  Anesthesia Plan: General   Post-op Pain Management:    Induction: Intravenous  PONV Risk Score and Plan: 2 and Ondansetron, Dexamethasone and Treatment may vary due to age or medical condition  Airway Management Planned: LMA  Additional Equipment:   Intra-op Plan:   Post-operative Plan: Extubation in OR  Informed Consent: I have reviewed the patients History and Physical, chart, labs and discussed the procedure including the risks, benefits and alternatives for the proposed anesthesia with the patient or authorized representative who has indicated his/her understanding and acceptance.     Dental advisory given  Plan Discussed with: CRNA  Anesthesia Plan Comments: (Patient consented for risks of anesthesia including but not limited to:  - adverse reactions to medications - damage to eyes, teeth, lips or other oral  mucosa - nerve damage due to positioning  - sore throat or hoarseness - damage to heart, brain, nerves, lungs, other parts of body or loss of life  Informed patient about role of CRNA in peri- and intra-operative care.  Patient voiced understanding.)        Anesthesia Quick Evaluation

## 2023-01-15 NOTE — Discharge Instructions (Signed)
AMBULATORY SURGERY  DISCHARGE INSTRUCTIONS   The drugs that you were given will stay in your system until tomorrow so for the next 24 hours you should not:  Drive an automobile Make any legal decisions Drink any alcoholic beverage   You may resume regular meals tomorrow.  Today it is better to start with liquids and gradually work up to solid foods.  You may eat anything you prefer, but it is better to start with liquids, then soup and crackers, and gradually work up to solid foods.   Please notify your doctor immediately if you have any unusual bleeding, trouble breathing, redness and pain at the surgery site, drainage, fever, or pain not relieved by medication.    Additional Instructions: PLEASE LEAVE EXPAREL (TEAL) ARMBAND ON FOR 4 DAYS     Please contact your physician with any problems or Same Day Surgery at [phone], Monday through Friday 6 am to 4 pm, or  at [facility] number at [phone].

## 2023-01-15 NOTE — Anesthesia Procedure Notes (Signed)
Procedure Name: LMA Insertion Date/Time: 01/15/2023 10:51 AM  Performed by: Cheral Bay, CRNAPre-anesthesia Checklist: Patient identified, Emergency Drugs available, Suction available and Patient being monitored Patient Re-evaluated:Patient Re-evaluated prior to induction Oxygen Delivery Method: Circle system utilized Preoxygenation: Pre-oxygenation with 100% oxygen Induction Type: IV induction Ventilation: Mask ventilation without difficulty LMA: LMA inserted LMA Size: 4.0 Tube type: Oral Number of attempts: 1 Placement Confirmation: positive ETCO2 and breath sounds checked- equal and bilateral Tube secured with: Tape Dental Injury: Teeth and Oropharynx as per pre-operative assessment

## 2023-01-15 NOTE — Transfer of Care (Signed)
Immediate Anesthesia Transfer of Care Note  Patient: Russell Grant  Procedure(s) Performed: ARTERIOVENOUS (AV) FISTULA CREATION (BRACHIAL CEPHALIC) (Left: Arm Upper)  Patient Location: PACU  Anesthesia Type:General  Level of Consciousness: awake and drowsy  Airway & Oxygen Therapy: Patient Spontanous Breathing and Patient connected to face mask oxygen  Post-op Assessment: Report given to RN and Post -op Vital signs reviewed and stable  Post vital signs: Reviewed and stable  Last Vitals:  Vitals Value Taken Time  BP 136/81 01/15/23 1303  Temp 36.5 C 01/15/23 1303  Pulse 72 01/15/23 1305  Resp 15 01/15/23 1305  SpO2 97 % 01/15/23 1305  Vitals shown include unvalidated device data.  Last Pain:  Vitals:   01/15/23 0837  TempSrc: Temporal  PainSc: 3       Patients Stated Pain Goal: 0 (01/15/23 0837)  Complications: No notable events documented.

## 2023-01-15 NOTE — Interval H&P Note (Signed)
History and Physical Interval Note:  01/15/2023 10:26 AM  Russell Grant  has presented today for surgery, with the diagnosis of ESRD.  The various methods of treatment have been discussed with the patient and family. After consideration of risks, benefits and other options for treatment, the patient has consented to  Procedure(s): ARTERIOVENOUS (AV) FISTULA CREATION (BRACHIAL CEPHALIC) (Left) as a surgical intervention.  The patient's history has been reviewed, patient examined, no change in status, stable for surgery.  I have reviewed the patient's chart and labs.  Questions were answered to the patient's satisfaction.     [name]

## 2023-01-15 NOTE — Anesthesia Postprocedure Evaluation (Signed)
Anesthesia Post Note  Patient: Russell Grant  Procedure(s) Performed: ARTERIOVENOUS (AV) FISTULA CREATION (BRACHIAL CEPHALIC) (Left: Arm Upper)  Patient location during evaluation: PACU Anesthesia Type: General Level of consciousness: awake and alert, oriented and patient cooperative Pain management: pain level controlled Vital Signs Assessment: post-procedure vital signs reviewed and stable Respiratory status: spontaneous breathing, nonlabored ventilation and respiratory function stable Cardiovascular status: blood pressure returned to baseline and stable Postop Assessment: adequate PO intake Anesthetic complications: no   No notable events documented.   Last Vitals:  Vitals:   01/15/23 1315 01/15/23 1330  BP: 131/68 123/69  Pulse: 71 72  Resp: 17 17  Temp:    SpO2: 95% 94%    Last Pain:  Vitals:   01/15/23 1330  TempSrc:   PainSc: 0-No pain                 Reed Breech

## 2023-01-15 NOTE — Op Note (Signed)
     OPERATIVE NOTE   PROCEDURE: left brachial cephalic arteriovenous fistula placement  PRE-OPERATIVE DIAGNOSIS: End Stage Renal Disease  POST-OPERATIVE DIAGNOSIS: End Stage Renal Disease  SURGEON: Earl Lites Niomie Englert  ASSISTANT(S): Rolla Plate, NP  ANESTHESIA: general  ESTIMATED BLOOD LOSS: <50 cc  FINDING(S): 4.5 mm cephalic vein  SPECIMEN(S):  none  INDICATIONS:   Russell Grant is a 52 y.o. male who presents with end stage renal disease.  The patient is scheduled for left brachiocephalic arteriovenous fistula placement.  The patient is aware the risks include but are not limited to: bleeding, infection, steal syndrome, nerve damage, ischemic monomelic neuropathy, failure to mature, and need for additional procedures.  The patient is aware of the risks of the procedure and elects to proceed forward.  DESCRIPTION: After full informed written consent was obtained from the patient, the patient was brought back to the operating room and placed supine upon the operating table.  Prior to induction, the patient received IV antibiotics.   After obtaining adequate anesthesia, the patient was then prepped and draped in the standard fashion for a left arm access procedure.   A first assistant was required to provide a safe and appropriate environment for executing the surgery.  The assistant was integral in providing retraction, exposure, running suture providing suction and in the closing process.   A curvilinear incision was then created midway between the radial impulse and the cephalic vein. The cephalic vein was then identified and dissected circumferentially. It was marked with a surgical marker.    Attention was then turned to the brachial artery which was exposed through the same incision and looped proximally and distally. Side branches were controlled with 4-0 silk ties.  The distal segment of the vein was ligated with a  2-0 silk, and the vein was transected.  The proximal  segment was interrogated with serial dilators.  The vein accepted up to a 4.5 mm dilator without any difficulty. Heparinized saline was infused into the vein and clamped it with a small bulldog.  At this point, I reset my exposure of the brachial artery and controlled the artery with vessel loops proximally and distally.  An arteriotomy was then made with a #11 blade, and extended with a Potts scissor.  Heparinized saline was injected proximal and distal into the radial artery.  The vein was then approximated to the artery while the artery was in its native bed and subsequently the vein was beveled using Potts scissors. The vein was then sewn to the artery in an end-to-side configuration with a running stitch of 6-0 Prolene.  Prior to completing this anastomosis Flushing maneuvers were performed and the artery was allowed to forward and back bleed.  There was no evidence of clot from any vessels.  I completed the anastomosis in the usual fashion and then released all vessel loops and clamps.    There was good  thrill in the venous outflow, and there was 1+ palpable radial pulse.  At this point, I irrigated out the surgical wound.  There was no further active bleeding.  The subcutaneous tissue was reapproximated with a running stitch of 3-0 Vicryl.  The skin was then reapproximated with a running subcuticular stitch of 4-0 Vicryl.  The skin was then cleaned, dried, and reinforced with Dermabond.    The patient tolerated this procedure well.   COMPLICATIONS: None  CONDITION: [facility]  Office: [phone]   01/15/2023, 1:03 PM

## 2023-01-18 ENCOUNTER — Encounter: Payer: Self-pay | Admitting: Vascular Surgery

## 2023-01-21 ENCOUNTER — Telehealth (INDEPENDENT_AMBULATORY_CARE_PROVIDER_SITE_OTHER): Payer: Self-pay

## 2023-01-21 NOTE — Telephone Encounter (Signed)
Completed and on your desk

## 2023-01-22 NOTE — Telephone Encounter (Signed)
Faxed the paperwork and made pt aware he states verbal understanding

## 2023-02-10 ENCOUNTER — Other Ambulatory Visit (INDEPENDENT_AMBULATORY_CARE_PROVIDER_SITE_OTHER): Payer: Self-pay | Admitting: Vascular Surgery

## 2023-02-10 ENCOUNTER — Encounter: Payer: Self-pay | Admitting: Vascular Surgery

## 2023-02-10 DIAGNOSIS — N186 End stage renal disease: Secondary | ICD-10-CM

## 2023-02-10 DIAGNOSIS — Z9889 Other specified postprocedural states: Secondary | ICD-10-CM

## 2023-02-15 ENCOUNTER — Ambulatory Visit (INDEPENDENT_AMBULATORY_CARE_PROVIDER_SITE_OTHER): Payer: BC Managed Care – PPO

## 2023-02-15 ENCOUNTER — Encounter (INDEPENDENT_AMBULATORY_CARE_PROVIDER_SITE_OTHER): Payer: Self-pay | Admitting: Vascular Surgery

## 2023-02-15 ENCOUNTER — Ambulatory Visit (INDEPENDENT_AMBULATORY_CARE_PROVIDER_SITE_OTHER): Payer: BC Managed Care – PPO | Admitting: Vascular Surgery

## 2023-02-15 VITALS — BP 138/70 | HR 78 | Resp 16 | Wt 296.0 lb

## 2023-02-15 DIAGNOSIS — Z9889 Other specified postprocedural states: Secondary | ICD-10-CM | POA: Diagnosis not present

## 2023-02-15 DIAGNOSIS — N186 End stage renal disease: Secondary | ICD-10-CM | POA: Diagnosis not present

## 2023-02-15 DIAGNOSIS — N184 Chronic kidney disease, stage 4 (severe): Secondary | ICD-10-CM
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
## 2023-02-25 ENCOUNTER — Telehealth (INDEPENDENT_AMBULATORY_CARE_PROVIDER_SITE_OTHER): Payer: Self-pay

## 2023-02-25 ENCOUNTER — Ambulatory Visit
Admission: RE | Admit: 2023-02-25 | Discharge: 2023-02-25 | Disposition: A | Discharge status: Home / Self Care | Payer: BC Managed Care – PPO | Source: Ambulatory Visit | Attending: Nephrology | Admitting: Nephrology

## 2023-02-25 VITALS — BP 152/61 | HR 83 | Temp 98.5°F | Resp 17 | Ht 70.0 in | Wt 300.0 lb

## 2023-02-25 DIAGNOSIS — R609 Edema, unspecified: Secondary | ICD-10-CM | POA: Diagnosis present

## 2023-02-25 DIAGNOSIS — N184 Chronic kidney disease, stage 4 (severe): Secondary | ICD-10-CM | POA: Diagnosis not present

## 2023-02-25 MED ORDER — FUROSEMIDE 10 MG/ML IJ SOLN
80.0000 mg | Freq: Once | INTRAMUSCULAR | Status: AC
  Administered 2023-02-25: 80 mg via INTRAVENOUS

## 2023-02-25 MED ORDER — FUROSEMIDE 10 MG/ML IJ SOLN
INTRAMUSCULAR | Status: AC
  Filled 2023-02-25: qty 8

## 2023-02-25 NOTE — Telephone Encounter (Addendum)
Victorino Dike called stating Russell Grant had recently had a fistula placed (02/15/23) and his GFR is 14. She would like to know is it useable or should he have a perma-cath placed ?  Please advise

## 2023-02-25 NOTE — Telephone Encounter (Signed)
My apologies I viewed it from the encounter aspect of the chart under vascular surgeries. I will call Victorino Dike back and inform her.

## 2023-02-25 NOTE — Telephone Encounter (Signed)
Error

## 2023-02-25 NOTE — Telephone Encounter (Signed)
He had his fistula placed on 01/15/2023 and a recent office visit on 02/15/2023 with Dr. Gilda Crease. Based on Dr. Marijean Heath notes, it seems that the fistula can be accessed

## 2023-02-26 ENCOUNTER — Other Ambulatory Visit
Admission: RE | Admit: 2023-02-26 | Discharge: 2023-02-26 | Disposition: A | Discharge status: Home / Self Care | Payer: BC Managed Care – PPO | Source: Ambulatory Visit | Attending: Nephrology | Admitting: Nephrology

## 2023-02-26 ENCOUNTER — Ambulatory Visit
Admission: RE | Admit: 2023-02-26 | Discharge: 2023-02-26 | Disposition: A | Discharge status: Home / Self Care | Payer: BC Managed Care – PPO | Source: Ambulatory Visit | Attending: Nephrology | Admitting: Nephrology

## 2023-02-26 DIAGNOSIS — N184 Chronic kidney disease, stage 4 (severe): Secondary | ICD-10-CM

## 2023-02-26 DIAGNOSIS — R609 Edema, unspecified: Secondary | ICD-10-CM | POA: Diagnosis not present

## 2023-02-26 DIAGNOSIS — N185 Chronic kidney disease, stage 5: Secondary | ICD-10-CM | POA: Insufficient documentation

## 2023-02-26 LAB — RENAL FUNCTION PANEL
Albumin: 1.9 g/dL — ABNORMAL LOW (ref 3.5–5.0)
Anion gap: 10 (ref 5–15)
BUN: 43 mg/dL — ABNORMAL HIGH (ref 6–20)
CO2: 21 mmol/L — ABNORMAL LOW (ref 22–32)
Calcium: 8.4 mg/dL — ABNORMAL LOW (ref 8.9–10.3)
Chloride: 106 mmol/L (ref 98–111)
Creatinine, Ser: 4.75 mg/dL — ABNORMAL HIGH (ref 0.61–1.24)
GFR, Estimated: 14 mL/min — ABNORMAL LOW (ref >60)
Glucose, Bld: 172 mg/dL — ABNORMAL HIGH (ref 70–99)
Phosphorus: 5 mg/dL — ABNORMAL HIGH (ref 2.5–4.6)
Potassium: 3.2 mmol/L — ABNORMAL LOW (ref 3.5–5.1)
Sodium: 137 mmol/L (ref 135–145)

## 2023-02-26 MED ORDER — FUROSEMIDE 10 MG/ML IJ SOLN
INTRAMUSCULAR | Status: AC
  Filled 2023-02-26: qty 8

## 2023-02-26 MED ORDER — FUROSEMIDE 10 MG/ML IJ SOLN
80.0000 mg | Freq: Once | INTRAMUSCULAR | Status: AC
  Administered 2023-02-26: 80 mg via INTRAVENOUS

## 2023-05-17 ENCOUNTER — Encounter (INDEPENDENT_AMBULATORY_CARE_PROVIDER_SITE_OTHER): Payer: BC Managed Care – PPO

## 2023-05-17 ENCOUNTER — Encounter (INDEPENDENT_AMBULATORY_CARE_PROVIDER_SITE_OTHER): Payer: BC Managed Care – PPO | Admitting: Vascular Surgery

## 2023-09-16 ENCOUNTER — Encounter: Payer: Self-pay | Admitting: *Deleted

## 2023-09-20 NOTE — Progress Notes (Signed)
 Patient has picked up prep.  Encourage to read instructions ASAP so he can call unit if he has any questions.

## 2023-09-27 ENCOUNTER — Encounter: Payer: Self-pay | Admitting: *Deleted

## 2023-09-27 ENCOUNTER — Ambulatory Visit: Payer: Medicare Other | Admitting: Anesthesiology

## 2023-09-27 ENCOUNTER — Encounter
Admission: RE | Disposition: A | Discharge status: Home / Self Care | Payer: Self-pay | Source: Home / Self Care | Attending: Gastroenterology

## 2023-09-27 ENCOUNTER — Ambulatory Visit
Admission: RE | Admit: 2023-09-27 | Discharge: 2023-09-27 | Disposition: A | Discharge status: Home / Self Care | Payer: Medicare Other | Attending: Gastroenterology | Admitting: Gastroenterology

## 2023-09-27 DIAGNOSIS — K635 Polyp of colon: Secondary | ICD-10-CM | POA: Diagnosis not present

## 2023-09-27 DIAGNOSIS — Z1211 Encounter for screening for malignant neoplasm of colon: Secondary | ICD-10-CM | POA: Diagnosis present

## 2023-09-27 DIAGNOSIS — Z992 Dependence on renal dialysis: Secondary | ICD-10-CM | POA: Diagnosis not present

## 2023-09-27 DIAGNOSIS — K5730 Diverticulosis of large intestine without perforation or abscess without bleeding: Secondary | ICD-10-CM | POA: Diagnosis not present

## 2023-09-27 DIAGNOSIS — D124 Benign neoplasm of descending colon: Secondary | ICD-10-CM | POA: Diagnosis not present

## 2023-09-27 DIAGNOSIS — E1122 Type 2 diabetes mellitus with diabetic chronic kidney disease: Secondary | ICD-10-CM | POA: Diagnosis not present

## 2023-09-27 DIAGNOSIS — N184 Chronic kidney disease, stage 4 (severe): Secondary | ICD-10-CM | POA: Diagnosis not present

## 2023-09-27 DIAGNOSIS — I129 Hypertensive chronic kidney disease with stage 1 through stage 4 chronic kidney disease, or unspecified chronic kidney disease: Secondary | ICD-10-CM | POA: Insufficient documentation

## 2023-09-27 DIAGNOSIS — Z7985 Long-term (current) use of injectable non-insulin antidiabetic drugs: Secondary | ICD-10-CM | POA: Diagnosis not present

## 2023-09-27 DIAGNOSIS — G4733 Obstructive sleep apnea (adult) (pediatric): Secondary | ICD-10-CM | POA: Insufficient documentation

## 2023-09-27 DIAGNOSIS — E66813 Obesity, class 3: Secondary | ICD-10-CM | POA: Insufficient documentation

## 2023-09-27 DIAGNOSIS — Z6837 Body mass index (BMI) 37.0-37.9, adult: Secondary | ICD-10-CM | POA: Diagnosis not present

## 2023-09-27 DIAGNOSIS — K641 Second degree hemorrhoids: Secondary | ICD-10-CM | POA: Insufficient documentation

## 2023-09-27 DIAGNOSIS — Z79899 Other long term (current) drug therapy: Secondary | ICD-10-CM | POA: Diagnosis not present

## 2023-09-27 HISTORY — DX: Proteinuria, unspecified: R80.9

## 2023-09-27 HISTORY — DX: Generalized edema: R60.1

## 2023-09-27 HISTORY — PX: POLYPECTOMY: SHX5525

## 2023-09-27 HISTORY — PX: COLONOSCOPY WITH PROPOFOL: SHX5780

## 2023-09-27 LAB — GLUCOSE, CAPILLARY: Glucose-Capillary: 87 mg/dL (ref 70–99)

## 2023-09-27 SURGERY — COLONOSCOPY WITH PROPOFOL
Anesthesia: General

## 2023-09-27 MED ORDER — PROPOFOL 500 MG/50ML IV EMUL
INTRAVENOUS | Status: DC | PRN
  Administered 2023-09-27: 140 ug/kg/min via INTRAVENOUS

## 2023-09-27 MED ORDER — PROPOFOL 10 MG/ML IV BOLUS
INTRAVENOUS | Status: DC | PRN
  Administered 2023-09-27: 20 mg via INTRAVENOUS
  Administered 2023-09-27: 30 mg via INTRAVENOUS
  Administered 2023-09-27: 70 mg via INTRAVENOUS

## 2023-09-27 MED ORDER — PROPOFOL 1000 MG/100ML IV EMUL
INTRAVENOUS | Status: AC
  Filled 2023-09-27: qty 200

## 2023-09-27 MED ORDER — SODIUM CHLORIDE 0.9 % IV SOLN: INTRAVENOUS | Status: DC

## 2023-09-27 MED ORDER — LIDOCAINE HCL (CARDIAC) PF 100 MG/5ML IV SOSY
PREFILLED_SYRINGE | INTRAVENOUS | Status: DC | PRN
  Administered 2023-09-27: 50 mg via INTRAVENOUS

## 2023-09-27 MED ORDER — PROPOFOL 1000 MG/100ML IV EMUL
INTRAVENOUS | Status: AC
  Filled 2023-09-27: qty 100
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
## 2023-09-27 NOTE — H&P (Signed)
Outpatient short stay form Pre-procedure 09/27/2023  Russell Bill, MD  Primary Physician: Marina Goodell, MD  Reason for visit:  Screening  History of present illness:    53 y/o gentleman with FSGS, obesity, OSA, and hypertension here for index screening colonoscopy. No blood thinners. He's adopted so not sure of family history. No significant abdominal surgeries.    Current Facility-Administered Medications:    0.9 %  sodium chloride infusion, , Intravenous, Continuous, Fareedah Mahler, Rossie Muskrat, MD, Last Rate: 20 mL/hr at 09/27/23 0728, New Bag at 09/27/23 0728  Medications Prior to Admission  Medication Sig Dispense Refill Last Dose/Taking   amLODipine (NORVASC) 10 MG tablet Take 1 tablet (10 mg total) by mouth daily. 30 tablet 0 09/27/2023 Morning   carvedilol (COREG) 6.25 MG tablet Take 6.25 mg by mouth 2 (two) times daily.   09/26/2023   Febuxostat 80 MG TABS Take 80 mg by mouth daily.   09/26/2023   fenofibrate (TRICOR) 145 MG tablet Take 145 mg by mouth daily.   09/26/2023   rosuvastatin (CRESTOR) 20 MG tablet Take 1 tablet (20 mg total) by mouth daily. 90 tablet 0 09/26/2023   torsemide (DEMADEX) 100 MG tablet Take 100 mg by mouth daily.   09/26/2023   aspirin EC 81 MG tablet Take 81 mg by mouth daily. Swallow whole.      Calcium 600-200 MG-UNIT tablet Take 1 tablet by mouth daily. Can take any over-the-counter supplement.      HYDROcodone-acetaminophen (NORCO) 5-325 MG tablet Take 1 tablet by mouth every 6 (six) hours as needed for moderate pain or severe pain. 30 tablet 0    hydrOXYzine (ATARAX) 25 MG tablet Take 1 tablet (25 mg total) by mouth 3 (three) times daily as needed for itching. 30 tablet 0    loratadine (CLARITIN) 10 MG tablet Take 10 mg by mouth daily as needed for itching.      metolazone (ZAROXOLYN) 2.5 MG tablet Take 2.5 mg by mouth 2 (two) times a week. Tues and Friday      Multiple Vitamins-Minerals (MULTIVITAMIN ADULTS PO) Take 1 tablet by mouth daily.        omega-3 acid ethyl esters (LOVAZA) 1 g capsule Take 2 capsules by mouth 2 (two) times daily.   08/21/2023   potassium chloride SA (KLOR-CON M) 20 MEQ tablet Take 2 tablets (40 mEq total) by mouth 2 (two) times daily. (Patient taking differently: Take 40 mEq by mouth 3 (three) times daily.) 120 tablet 2    senna-docusate (SENOKOT-S) 8.6-50 MG tablet Take 1 tablet by mouth daily.      tirzepatide (MOUNJARO) 7.5 MG/0.5ML Pen Inject 7.5 mg into the skin once a week.   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    Physical Exam  Gen: Alert, oriented. Appears stated age.  HEENT: PERRLA. Lungs:No respiratory distress CV: RRR Abd: soft, benign, no masses Ext: No edema    Planned procedures: Proceed with colonoscopy. The patient understands the nature of the planned procedure, indications, risks, alternatives and potential complications including but not limited to bleeding, infection, perforation, damage to internal organs and possible oversedation/side effects from anesthesia. The patient agrees and gives consent to proceed.  Please refer to procedure notes for findings, recommendations and patient disposition/instructions.     Russell Bill, MD [facility]

## 2023-09-27 NOTE — Transfer of Care (Signed)
Immediate Anesthesia Transfer of Care Note  Patient: Russell Grant  Procedure(s) Performed: COLONOSCOPY WITH PROPOFOL  Patient Location: Endoscopy Unit  Anesthesia Type:General  Level of Consciousness: awake, alert , and oriented  Airway & Oxygen Therapy: Patient Spontanous Breathing  Post-op Assessment: Report given to RN and Post -op Vital signs reviewed and stable  Post vital signs: Reviewed and stable  Last Vitals:  Vitals Value Taken Time  BP 119/54   Temp    Pulse 77 09/27/23 0811  Resp 20 09/27/23 0811  SpO2 97 % 09/27/23 0811  Vitals shown include unfiled device data.  Last Pain:  Vitals:   09/27/23 0717  TempSrc:   PainSc: 0-No pain         Complications: No notable events documented.

## 2023-09-27 NOTE — Anesthesia Postprocedure Evaluation (Signed)
Anesthesia Post Note  Patient: Russell Grant  Procedure(s) Performed: COLONOSCOPY WITH PROPOFOL  Patient location during evaluation: PACU Anesthesia Type: General Level of consciousness: awake and awake and alert Pain management: satisfactory to patient Vital Signs Assessment: post-procedure vital signs reviewed and stable Respiratory status: spontaneous breathing Cardiovascular status: stable Anesthetic complications: no   No notable events documented.   Last Vitals:  Vitals:   09/27/23 0822 09/27/23 0830  BP: (!) 140/58 (!) 153/65  Pulse: 79 80  Resp: 19 17  Temp:    SpO2: 97% 97%    Last Pain:  Vitals:   09/27/23 0830  TempSrc:   PainSc: 0-No pain                 VAN STAVEREN,Keshawn Fiorito

## 2023-09-27 NOTE — Op Note (Signed)
[facility] Patient Name: Russell Grant Procedure Date: 09/27/2023 7:24 AM MRN: [id number] Account #: [id number] Date of Birth: 06-04-1971 Admit Type: Outpatient Age: 53 Room: [facility] ENDO ROOM 3 Gender: Male Note Status: Finalized Instrument Name: Prentice Docker [id number] Procedure:             Colonoscopy Indications:           Screening for colorectal malignant neoplasm Providers:             Eather Colas MD, MD Medicines:             Monitored Anesthesia Care Complications:         No immediate complications. Estimated blood loss:                         Minimal. Procedure:             Pre-Anesthesia Assessment:                        - Prior to the procedure, a History and Physical was                         performed, and patient medications and allergies were                         reviewed. The patient is competent. The risks and                         benefits of the procedure and the sedation options and                         risks were discussed with the patient. All questions                         were answered and informed consent was obtained.                         Patient identification and proposed procedure were                         verified by the physician, the nurse, the                         anesthesiologist, the anesthetist and the technician                         in the endoscopy suite. Mental Status Examination:                         alert and oriented. Airway Examination: normal                         oropharyngeal airway and neck mobility. Respiratory                         Examination: clear to auscultation. CV Examination:                         normal. Prophylactic Antibiotics: The patient does not  require prophylactic antibiotics. Prior                         Anticoagulants: The patient has taken no anticoagulant                         or antiplatelet agents. ASA Grade  Assessment: III - A                         patient with severe systemic disease. After reviewing                         the risks and benefits, the patient was deemed in                         satisfactory condition to undergo the procedure. The                         anesthesia plan was to use monitored anesthesia care                         (MAC). Immediately prior to administration of                         medications, the patient was re-assessed for adequacy                         to receive sedatives. The heart rate, respiratory                         rate, oxygen saturations, blood pressure, adequacy of                         pulmonary ventilation, and response to care were                         monitored throughout the procedure. The physical                         status of the patient was re-assessed after the                         procedure.                        After obtaining informed consent, the colonoscope was                         passed under direct vision. Throughout the procedure,                         the patient's blood pressure, pulse, and oxygen                         saturations were monitored continuously. The                         Colonoscope was introduced through the anus and  advanced to the the cecum, identified by appendiceal                         orifice and ileocecal valve. The colonoscopy was                         performed without difficulty. The patient tolerated                         the procedure well. The quality of the bowel                         preparation was good. The ileocecal valve, appendiceal                         orifice, and rectum were photographed. Findings:      The perianal and digital rectal examinations were normal.      Two sessile polyps were found in the descending colon. The polyps were 2       to 3 mm in size. These polyps were removed with a cold snare. Resection        and retrieval were complete. Estimated blood loss was minimal.      Multiple small-mouthed diverticula were found in the sigmoid colon.      Internal hemorrhoids were found during retroflexion. The hemorrhoids       were Grade II (internal hemorrhoids that prolapse but reduce       spontaneously).      The exam was otherwise without abnormality on direct and retroflexion       views. Impression:            - Two 2 to 3 mm polyps in the descending colon,                         removed with a cold snare. Resected and retrieved.                        - Diverticulosis in the sigmoid colon.                        - Internal hemorrhoids.                        - The examination was otherwise normal on direct and                         retroflexion views. Recommendation:        - Discharge patient to home.                        - Resume previous diet.                        - Continue present medications.                        - Await pathology results.                        - Repeat colonoscopy in 7 years for surveillance.                        -  Return to referring physician as previously                         scheduled. Procedure Code(s):     --- Professional ---                        [phone], Colonoscopy, flexible; with removal of                         tumor(s), polyp(s), or other lesion(s) by snare                         technique Diagnosis Code(s):     --- Professional ---                        Z12.11, Encounter for screening for malignant neoplasm                         of colon                        D12.4, Benign neoplasm of descending colon                        K64.1, Second degree hemorrhoids                        K57.30, Diverticulosis of large intestine without                         perforation or abscess without bleeding CPT copyright 2022 American Medical Association. All rights reserved. The codes documented in this report are preliminary and upon coder  review may  be revised to meet current compliance requirements. Eather Colas MD, MD 09/27/2023 8:13:23 AM Number of Addenda: 0 Note Initiated On: 09/27/2023 7:24 AM Scope Withdrawal Time: 0 hours 10 minutes 25 seconds  Total Procedure Duration: 0 hours 16 minutes 39 seconds  Estimated Blood Loss:  Estimated blood loss was minimal.      [facility]

## 2023-09-27 NOTE — Interval H&P Note (Signed)
History and Physical Interval Note:  09/27/2023 7:44 AM  Russell Grant  has presented today for surgery, with the diagnosis of Colon Cancer Screening.  The various methods of treatment have been discussed with the patient and family. After consideration of risks, benefits and other options for treatment, the patient has consented to  Procedure(s): COLONOSCOPY WITH PROPOFOL (N/A) as a surgical intervention.  The patient's history has been reviewed, patient examined, no change in status, stable for surgery.  I have reviewed the patient's chart and labs.  Questions were answered to the patient's satisfaction.     Regis Bill  Ok to proceed with colonoscopy

## 2023-09-28 ENCOUNTER — Encounter: Payer: Self-pay | Admitting: Gastroenterology

## 2023-09-28 LAB — SURGICAL PATHOLOGY

## 2023-11-13 ENCOUNTER — Other Ambulatory Visit
Admission: RE | Admit: 2023-11-13 | Discharge: 2023-11-13 | Disposition: A | Discharge status: Home / Self Care | Source: Ambulatory Visit | Attending: Nephrology | Admitting: Nephrology

## 2023-11-13 DIAGNOSIS — I129 Hypertensive chronic kidney disease with stage 1 through stage 4 chronic kidney disease, or unspecified chronic kidney disease: Secondary | ICD-10-CM | POA: Diagnosis present

## 2023-11-13 LAB — POTASSIUM: Potassium: 5.4 mmol/L — ABNORMAL HIGH (ref 3.5–5.1)

## 2023-11-18 ENCOUNTER — Other Ambulatory Visit
Admission: RE | Admit: 2023-11-18 | Discharge: 2023-11-18 | Disposition: A | Discharge status: Home / Self Care | Source: Ambulatory Visit | Attending: Nephrology | Admitting: Nephrology

## 2023-11-18 DIAGNOSIS — I129 Hypertensive chronic kidney disease with stage 1 through stage 4 chronic kidney disease, or unspecified chronic kidney disease: Secondary | ICD-10-CM | POA: Diagnosis present

## 2023-11-18 LAB — POTASSIUM: Potassium: 4.7 mmol/L (ref 3.5–5.1)

## 2023-12-25 ENCOUNTER — Other Ambulatory Visit
Admission: RE | Admit: 2023-12-25 | Discharge: 2023-12-25 | Disposition: A | Discharge status: Home / Self Care | Source: Ambulatory Visit | Attending: Nephrology | Admitting: Nephrology

## 2023-12-25 DIAGNOSIS — N186 End stage renal disease: Secondary | ICD-10-CM | POA: Diagnosis present

## 2023-12-25 LAB — POTASSIUM: Potassium: 3.1 mmol/L — ABNORMAL LOW (ref 3.5–5.1)

## 2023-12-28 ENCOUNTER — Other Ambulatory Visit
Admission: RE | Admit: 2023-12-28 | Discharge: 2023-12-28 | Disposition: A | Discharge status: Home / Self Care | Source: Ambulatory Visit | Attending: Nephrology | Admitting: Nephrology

## 2023-12-28 DIAGNOSIS — E875 Hyperkalemia: Secondary | ICD-10-CM | POA: Insufficient documentation

## 2023-12-28 DIAGNOSIS — N189 Chronic kidney disease, unspecified: Secondary | ICD-10-CM | POA: Diagnosis not present

## 2023-12-28 DIAGNOSIS — D631 Anemia in chronic kidney disease: Secondary | ICD-10-CM | POA: Diagnosis not present

## 2023-12-28 LAB — HEMOGLOBIN: Hemoglobin: 7.9 g/dL — ABNORMAL LOW (ref 13.0–17.0)

## 2023-12-28 LAB — POTASSIUM: Potassium: 4 mmol/L (ref 3.5–5.1)

## 2024-01-25 ENCOUNTER — Encounter (INDEPENDENT_AMBULATORY_CARE_PROVIDER_SITE_OTHER): Payer: Self-pay

## 2024-02-17 ENCOUNTER — Other Ambulatory Visit: Payer: Self-pay | Admitting: Family Medicine

## 2024-02-17 DIAGNOSIS — M5416 Radiculopathy, lumbar region: Secondary | ICD-10-CM

## 2024-02-17 DIAGNOSIS — G959 Disease of spinal cord, unspecified: Secondary | ICD-10-CM

## 2024-02-19 ENCOUNTER — Ambulatory Visit
Admission: RE | Admit: 2024-02-19 | Discharge: 2024-02-19 | Disposition: A | Discharge status: Home / Self Care | Source: Ambulatory Visit | Attending: Family Medicine | Admitting: Family Medicine

## 2024-02-19 DIAGNOSIS — M5416 Radiculopathy, lumbar region: Secondary | ICD-10-CM

## 2024-02-19 DIAGNOSIS — G959 Disease of spinal cord, unspecified: Secondary | ICD-10-CM

## 2024-04-07 NOTE — Progress Notes (Signed)
 History of Present Illness: [name] is a 53 y.o. male who presents today for Skin Lesion (Please refer to intake note. /) Patient is on dialysis.  He uses Dial soap.  Nonhealing area on the back of his neck.   Pertinent Past Medical History: Negative for melanoma.  Positive for non-melanoma skin cancer.    Past Medical History:  Diagnosis Date  . Chicken pox   . Hyperlipidemia   . Hypertension       Medications: Current Outpatient Medications on File Prior to Visit  Medication Sig Dispense Refill  . amLODIPine  (NORVASC ) 10 MG tablet Take 1 tablet by mouth once daily    . aspirin  81 MG EC tablet Take 81 mg by mouth once daily    . carvediloL  (COREG ) 6.25 MG tablet Take 6.25 mg by mouth once daily    . cetirizine HCl (ZYRTEC ORAL) Take 10 mg by mouth once daily as needed    . ergocalciferol , vitamin D2, 1,250 mcg (50,000 unit) capsule Take 50,000 Units by mouth once a week    . Febuxostat  (ULORIC ) 80 mg tablet Take 80 mg by mouth once daily    . fenofibrate  nanocrystallized (TRICOR ) 145 MG tablet TAKE 1 TABLET BY MOUTH ONCE DAILY. 90 tablet 1  . hydrOXYzine  (ATARAX ) 25 MG tablet Take 25 mg by mouth as needed    . lidocaine -prilocaine (EMLA) cream Apply 1 Application topically every third day    . metOLazone  (ZAROXOLYN ) 2.5 MG tablet Take 2.5 mg by mouth twice a week    . omega-3 acid ethyl esters (LOVAZA ) 1 gram capsule TAKE 2 CAP BY MOUTH TWICE DAILY 360 capsule 1  . ondansetron  (ZOFRAN ) 4 MG tablet Take 4 mg by mouth every 8 (eight) hours as needed    . predniSONE  (DELTASONE ) 20 MG tablet Take 1 tablet (20 mg total) by mouth 2 (two) times daily as needed (x 3 days for gout flare) 12 tablet 1  . RENA-VITE 0.8 mg Take 1 tablet by mouth once daily    . rosuvastatin  (CRESTOR ) 40 MG tablet TAKE 1 TABLET BY MOUTH EVERY DAY 90 tablet 1  . sertraline (ZOLOFT) 50 MG tablet Take 1 tablet (50 mg total) by mouth once daily for 90 days 90 tablet 0  . sodium zirconium cyclosilicate  (LOKELMA) 10 gram packet Take 10 g by mouth as needed    . TORsemide  (DEMADEX ) 100 MG tablet Take 100 mg by mouth once daily    . calcium  acetate,phosphat bind, (PHOSLO) 667 mg capsule Take 2,001 mg by mouth 3 (three) times daily with meals (Patient not taking: Reported on 04/07/2024)     No current facility-administered medications on file prior to visit.    Allergies: Allergies  Allergen Reactions  . Hydrochlorothiazide Rash  . Cephalexin Unknown  . Tacrolimus Unknown  . Cefdinir Rash    Social History: Social History   Socioeconomic History  . Marital status: Married  Tobacco Use  . Smoking status: Never    Passive exposure: Past  . Smokeless tobacco: Never  Vaping Use  . Vaping status: Never Used  Substance and Sexual Activity  . Alcohol use: Never  . Drug use: Never   Social Drivers of Corporate investment banker Strain: Low Risk  (02/17/2024)   Overall Financial Resource Strain (CARDIA)   . Difficulty of Paying Living Expenses: Not very hard  Food Insecurity: No Food Insecurity (02/17/2024)   Hunger Vital Sign   . Worried About Programme researcher, broadcasting/film/video in the  Last Year: Never true   . Ran Out of Food in the Last Year: Never true  Transportation Needs: No Transportation Needs (02/17/2024)   PRAPARE - Transportation   . Lack of Transportation (Medical): No   . Lack of Transportation (Non-Medical): No  Stress: No Stress Concern Present (01/17/2024)   Received from [facility] - Occupational Stress Questionnaire   . Feeling of Stress : Not at all  Housing Stability: Low Risk  (02/17/2024)   Housing Stability Vital Sign   . Unable to Pay for Housing in the Last Year: No   . Number of Times Moved in the Last Year: 0   . Homeless in the Last Year: No       Pertinent Family History: Not evaluated for melanoma    Examination: There were no vitals taken for this visit. General:  Well developed, well nourished pleasant adult in  no acute distress, A&O x 3 Skin: Examination of the hair, scalp, face, ears, neck, chest, back, abdomen, buttocks, bilateral upper and lower extremities, hands, feet, palms, soles, and nails performed and significant for: 1 tan macules on all sun exposed areas 2 generalized dry scaly skin 3 linear scar on the dorsum of the left hand 4 erythematous scaly macule on the right forearm and the dorsum of the left foot 5 nape of the neck there is a half a centimeter scabby papule 6 yellow papules on the face yellow globules and arborizing vessels were seen with dermoscopy  Assessment/Plan 1 lentigines no treatment is needed 2 generalized dry skin get a soap for dry skin like Dove for purpose apply moisture  after the shower 3 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 5 abrasion patient is picking on it Polysporin ointment twice a day till its healed.  Patient will send me a message in a month if it is not healed. 6 sebaceous gland hyperplasia no treatment is needed  Self skin examinations every month and total-body skin exam in 6 months  Provided education about sun protective practices; sunscreen recommendations of SPF 30 or higher zinc oxide containing products and application and re-application instructions; self skin examination; and the ABCDEs of melanoma.   Follow up: 6 months     ROBERT VICTORY SALLE, MD

## 2024-04-07 NOTE — Progress Notes (Signed)
 DERMATOLOGY INTAKE   Chief Complaint  Patient presents with  . Skin Lesion    Please refer to intake note.      HISTORY OF PRESENT ILLNESS    Russell Grant is a 53 y.o. male who presents for the following:   Reason for visit: Patient presents to clinic for evaluation of skin lesions distributed across entire body.   Does the patient have any specific areas of concern?  Specific Concerns: No   Additional concerns: No other skin complaints today  Does patient consent to the use of [employer] AI to help generate visit note?  N/A   Does patient request a chaperone to be present during the exam?  No

## 2024-05-30 ENCOUNTER — Telehealth: Payer: Self-pay | Admitting: Neurosurgery

## 2024-05-30 NOTE — Telephone Encounter (Signed)
Left voicemail no answer

## 2024-06-07 ENCOUNTER — Other Ambulatory Visit (INDEPENDENT_AMBULATORY_CARE_PROVIDER_SITE_OTHER): Payer: Self-pay | Admitting: Vascular Surgery

## 2024-06-07 DIAGNOSIS — N186 End stage renal disease: Secondary | ICD-10-CM

## 2024-06-08 ENCOUNTER — Ambulatory Visit (INDEPENDENT_AMBULATORY_CARE_PROVIDER_SITE_OTHER): Payer: Self-pay | Admitting: Nurse Practitioner

## 2024-06-08 ENCOUNTER — Ambulatory Visit (INDEPENDENT_AMBULATORY_CARE_PROVIDER_SITE_OTHER): Payer: Self-pay

## 2024-06-08 ENCOUNTER — Encounter (INDEPENDENT_AMBULATORY_CARE_PROVIDER_SITE_OTHER): Payer: Self-pay | Admitting: Nurse Practitioner

## 2024-06-08 VITALS — BP 115/64 | HR 79 | Resp 18 | Ht 70.0 in | Wt 264.4 lb

## 2024-06-08 DIAGNOSIS — E785 Hyperlipidemia, unspecified: Secondary | ICD-10-CM | POA: Diagnosis not present

## 2024-06-08 DIAGNOSIS — N186 End stage renal disease: Secondary | ICD-10-CM

## 2024-06-08 DIAGNOSIS — I10 Essential (primary) hypertension: Secondary | ICD-10-CM
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
## 2024-06-12 ENCOUNTER — Telehealth (INDEPENDENT_AMBULATORY_CARE_PROVIDER_SITE_OTHER): Payer: Self-pay

## 2024-06-12 NOTE — Telephone Encounter (Signed)
 Spoke with the patient and he is scheduled with Dr. Jama for a LUE angio on 06/27/24 with a 11:00 am arrival time to the [facility]. Pre-procedure instructions were discussed and will be sent to Mychart and mailed.

## 2024-06-12 NOTE — Telephone Encounter (Signed)
 I attempted to contact the patient to schedule him for a LUE angio with Dr. Jama. A message was left for a return call.

## 2024-06-15 ENCOUNTER — Encounter: Payer: Self-pay | Admitting: Orthopedic Surgery
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
## 2024-06-16 ENCOUNTER — Other Ambulatory Visit: Payer: Self-pay | Admitting: Family Medicine

## 2024-06-16 ENCOUNTER — Inpatient Hospital Stay
Admission: RE | Admit: 2024-06-16 | Discharge: 2024-06-16 | Disposition: A | Discharge status: Home / Self Care | Payer: Self-pay | Source: Ambulatory Visit | Attending: Orthopedic Surgery | Admitting: Orthopedic Surgery

## 2024-06-16 ENCOUNTER — Telehealth: Payer: Self-pay | Admitting: Orthopedic Surgery

## 2024-06-16 DIAGNOSIS — Z049 Encounter for examination and observation for unspecified reason: Secondary | ICD-10-CM

## 2024-06-16 NOTE — Telephone Encounter (Signed)
 Pt is needing to know if he needs the nerve conduction study referred by [facility] done before this appointment with you next week or if he can continue his appointment as scheduled & get the study done on the 30th.

## 2024-06-16 NOTE — Telephone Encounter (Signed)
 Addressed. Does not need to wait until [facility]. Can see me first.

## 2024-06-22 ENCOUNTER — Ambulatory Visit: Admitting: Orthopedic Surgery

## 2024-06-27 ENCOUNTER — Ambulatory Visit
Admission: RE | Admit: 2024-06-27 | Discharge: 2024-06-27 | Disposition: A | Attending: Vascular Surgery | Admitting: Vascular Surgery

## 2024-06-27 ENCOUNTER — Encounter: Payer: Self-pay | Admitting: Vascular Surgery

## 2024-06-27 ENCOUNTER — Other Ambulatory Visit: Payer: Self-pay

## 2024-06-27 ENCOUNTER — Encounter
Admission: RE | Disposition: A | Discharge status: Home / Self Care | Payer: Self-pay | Source: Home / Self Care | Attending: Vascular Surgery

## 2024-06-27 DIAGNOSIS — Z992 Dependence on renal dialysis: Secondary | ICD-10-CM | POA: Diagnosis not present

## 2024-06-27 DIAGNOSIS — T82898A Other specified complication of vascular prosthetic devices, implants and grafts, initial encounter: Secondary | ICD-10-CM | POA: Diagnosis not present

## 2024-06-27 DIAGNOSIS — E1122 Type 2 diabetes mellitus with diabetic chronic kidney disease: Secondary | ICD-10-CM | POA: Insufficient documentation

## 2024-06-27 DIAGNOSIS — Y832 Surgical operation with anastomosis, bypass or graft as the cause of abnormal reaction of the patient, or of later complication, without mention of misadventure at the time of the procedure: Secondary | ICD-10-CM | POA: Insufficient documentation

## 2024-06-27 DIAGNOSIS — N186 End stage renal disease: Secondary | ICD-10-CM

## 2024-06-27 DIAGNOSIS — Z79899 Other long term (current) drug therapy: Secondary | ICD-10-CM | POA: Diagnosis not present

## 2024-06-27 DIAGNOSIS — I120 Hypertensive chronic kidney disease with stage 5 chronic kidney disease or end stage renal disease: Secondary | ICD-10-CM | POA: Insufficient documentation

## 2024-06-27 DIAGNOSIS — E785 Hyperlipidemia, unspecified: Secondary | ICD-10-CM | POA: Diagnosis not present

## 2024-06-27 DIAGNOSIS — Z59868 Other specified financial insecurity: Secondary | ICD-10-CM | POA: Insufficient documentation

## 2024-06-27 DIAGNOSIS — G458 Other transient cerebral ischemic attacks and related syndromes: Secondary | ICD-10-CM

## 2024-06-27 HISTORY — PX: UPPER EXTREMITY ANGIOGRAPHY: CATH118270

## 2024-06-27 LAB — GLUCOSE, CAPILLARY
Glucose-Capillary: 86 mg/dL (ref 70–99)
Glucose-Capillary: 89 mg/dL (ref 70–99)

## 2024-06-27 LAB — POTASSIUM (ARMC VASCULAR LAB ONLY): Potassium (ARMC vascular lab): 5.6 mmol/L — ABNORMAL HIGH (ref 3.5–5.1)

## 2024-06-27 SURGERY — UPPER EXTREMITY ANGIOGRAPHY
Anesthesia: Moderate Sedation | Laterality: Left

## 2024-06-27 MED ORDER — VANCOMYCIN HCL IN DEXTROSE 1-5 GM/200ML-% IV SOLN
1000.0000 mg | INTRAVENOUS | Status: AC
  Administered 2024-06-27: 1000 mg via INTRAVENOUS

## 2024-06-27 MED ORDER — MIDAZOLAM HCL 2 MG/ML PO SYRP: 8.0000 mg | ORAL_SOLUTION | Freq: Once | ORAL | Status: DC | PRN

## 2024-06-27 MED ORDER — SODIUM CHLORIDE 0.9% FLUSH: 3.0000 mL | INTRAVENOUS | Status: DC | PRN

## 2024-06-27 MED ORDER — HYDRALAZINE HCL 20 MG/ML IJ SOLN: 5.0000 mg | INTRAMUSCULAR | Status: DC | PRN

## 2024-06-27 MED ORDER — LIDOCAINE HCL (PF) 1 % IJ SOLN
INTRAMUSCULAR | Status: DC | PRN
  Administered 2024-06-27: 10 mL via INTRADERMAL

## 2024-06-27 MED ORDER — VANCOMYCIN HCL IN DEXTROSE 1-5 GM/200ML-% IV SOLN
INTRAVENOUS | Status: AC
  Filled 2024-06-27: qty 200

## 2024-06-27 MED ORDER — ONDANSETRON HCL 4 MG/2ML IJ SOLN: 4.0000 mg | Freq: Four times a day (QID) | INTRAMUSCULAR | Status: DC | PRN

## 2024-06-27 MED ORDER — MORPHINE SULFATE (PF) 2 MG/ML IV SOLN: 2.0000 mg | INTRAVENOUS | Status: DC | PRN

## 2024-06-27 MED ORDER — MIDAZOLAM HCL (PF) 2 MG/2ML IJ SOLN
INTRAMUSCULAR | Status: DC | PRN
  Administered 2024-06-27: 2 mg via INTRAVENOUS
  Administered 2024-06-27: .5 mg via INTRAVENOUS

## 2024-06-27 MED ORDER — IODIXANOL 320 MG/ML IV SOLN
INTRAVENOUS | Status: DC | PRN
  Administered 2024-06-27: 40 mL via INTRA_ARTERIAL

## 2024-06-27 MED ORDER — FAMOTIDINE 20 MG PO TABS: 40.0000 mg | ORAL_TABLET | Freq: Once | ORAL | Status: DC | PRN

## 2024-06-27 MED ORDER — FENTANYL CITRATE (PF) 100 MCG/2ML IJ SOLN
INTRAMUSCULAR | Status: AC
  Filled 2024-06-27: qty 4

## 2024-06-27 MED ORDER — OXYCODONE HCL 5 MG PO TABS: 5.0000 mg | ORAL_TABLET | ORAL | Status: DC | PRN

## 2024-06-27 MED ORDER — HEPARIN SODIUM (PORCINE) 1000 UNIT/ML IJ SOLN
INTRAMUSCULAR | Status: AC
  Filled 2024-06-27: qty 10

## 2024-06-27 MED ORDER — HEPARIN (PORCINE) IN NACL 1000-0.9 UT/500ML-% IV SOLN
INTRAVENOUS | Status: DC | PRN
  Administered 2024-06-27: 1000 mL

## 2024-06-27 MED ORDER — FENTANYL CITRATE (PF) 100 MCG/2ML IJ SOLN
INTRAMUSCULAR | Status: DC | PRN
  Administered 2024-06-27: 25 ug via INTRAVENOUS
  Administered 2024-06-27: 50 ug via INTRAVENOUS

## 2024-06-27 MED ORDER — MIDAZOLAM HCL 2 MG/2ML IJ SOLN
INTRAMUSCULAR | Status: AC
  Filled 2024-06-27: qty 4

## 2024-06-27 MED ORDER — ACETAMINOPHEN 325 MG PO TABS: 650.0000 mg | ORAL_TABLET | ORAL | Status: DC | PRN

## 2024-06-27 MED ORDER — DIPHENHYDRAMINE HCL 50 MG/ML IJ SOLN: 50.0000 mg | Freq: Once | INTRAMUSCULAR | Status: DC | PRN

## 2024-06-27 MED ORDER — SODIUM CHLORIDE 0.9% FLUSH: 3.0000 mL | Freq: Two times a day (BID) | INTRAVENOUS | Status: DC

## 2024-06-27 MED ORDER — LABETALOL HCL 5 MG/ML IV SOLN: 10.0000 mg | INTRAVENOUS | Status: DC | PRN

## 2024-06-27 MED ORDER — SODIUM CHLORIDE 0.9 % IV SOLN: 250.0000 mL | INTRAVENOUS | Status: DC | PRN

## 2024-06-27 MED ORDER — METHYLPREDNISOLONE SODIUM SUCC 125 MG IJ SOLR: 125.0000 mg | Freq: Once | INTRAMUSCULAR | Status: DC | PRN

## 2024-06-27 MED ORDER — HYDROMORPHONE HCL 1 MG/ML IJ SOLN: 1.0000 mg | Freq: Once | INTRAMUSCULAR | Status: DC | PRN

## 2024-06-27 MED ORDER — SODIUM CHLORIDE 0.9 % IV SOLN: INTRAVENOUS | Status: DC

## 2024-06-27 SURGICAL SUPPLY — 15 items
CATH ANGIO 5F 100CM .035 PIG (CATHETERS) IMPLANT
CATH BEACON 5 .035 100 H1 TIP (CATHETERS) IMPLANT
CATH SEEKER .035X150CM (CATHETERS) IMPLANT
COVER PROBE ULTRASOUND 5X96 (MISCELLANEOUS) IMPLANT
DEVICE STARCLOSE SE CLOSURE (Vascular Products) IMPLANT
GLIDEWIRE ADV .035X260CM (WIRE) IMPLANT
NDL ENTRY 21GA 7CM ECHOTIP (NEEDLE) IMPLANT
NEEDLE ENTRY 21GA 7CM ECHOTIP (NEEDLE) ×1 IMPLANT
PACK ANGIOGRAPHY (CUSTOM PROCEDURE TRAY) IMPLANT
SET INTRO CAPELLA COAXIAL (SET/KITS/TRAYS/PACK) IMPLANT
SHEATH BRITE TIP 5FRX11 (SHEATH) IMPLANT
SYR MEDRAD MARK 7 150ML (SYRINGE) IMPLANT
TUBING CONTRAST HIGH PRESS 72 (TUBING) IMPLANT
WIRE J 3MM .035X145CM (WIRE) IMPLANT
WIRE SUPRACORE 300CM (WIRE) IMPLANT

## 2024-06-27 NOTE — Op Note (Addendum)
 OPERATIVE NOTE   PROCEDURE: Arch aortogram with left arm angiography third order catheter placement. Ultrasound-guided access to the right common femoral artery StarClose device right common femoral artery  PRE-OPERATIVE DIAGNOSIS:   Complication of dialysis access with possible steal syndrome  2.   End Stage Renal Disease  POST-OPERATIVE DIAGNOSIS:  End-stage renal disease without evidence for steal syndrome  SURGEON: Cordella JUDITHANN Shawl, M.D.  ANESTHESIA: Conscious sedation was administered under my direct supervision by the interventional radiology RN.  IV Versed  plus fentanyl  were utilized. Continuous ECG, pulse oximetry and blood pressure was monitored throughout the entire procedure.  Conscious sedation was for a total of 41 minutes.  ESTIMATED BLOOD LOSS: minimal  FINDING(S): Several very large branches off the fistula itself possibly adding to the volume flow and contributing to his symptoms however contrast injection from the proximal brachial artery clearly demonstrates arterial flow to the hand via the ulnar and the radial arteries and no significant atherosclerotic stenosis or stricture is identified proximally or distally  SPECIMEN(S):  None  CONTRAST: 40 cc  FLUOROSCOPY TIME: 4.0 minutes  INDICATIONS: Russell Grant is a 53 y.o. male who  presents with malfunctioning left arm AV access.  The patient is scheduled for angiography with possible intervention of the AV access to prevent loss of the permanent access.  The patient is aware the risks include but are not limited to: bleeding, infection, thrombosis of the cannulated access, and possible anaphylactic reaction to the contrast.  The patient acknowledges if the access can not be salvaged a tunneled catheter will be needed and will be placed during this procedure.  The patient is aware of the risks of the procedure and elects to proceed with the angiogram and intervention.  DESCRIPTION: After full informed written  consent was obtained, the patient was brought back to the Special Procedure suite and placed supine position.  Appropriate cardiopulmonary monitors were placed.  The right groin was prepped and draped in the standard fashion.  Appropriate timeout is called.   Ultrasound is placed in a sterile sleeve.  The right groin is examined with ultrasound and the common femoral artery is identified.  It is pulsatile and echolucent indicating patency.  Images recorded for the permanent record.  1% lidocaine  is infiltrated in the soft tissues including the perivascular space.  Access to the common femoral artery was then obtained under direct ultrasound visualization.  Images recorded for the permanent record.  Microneedle was inserted followed by the microwire micro sheath followed by the J-wire and then a 5 French sheath.  J-wire and pigtail catheter were then advanced up into the ascending aorta and an LAO projection of the aortic arch is obtained with bolus injection of contrast.  Advantage wires introduced and the pigtail catheter was exchanged for a headhunter catheter.  The headhunter catheter and advantage wire were then used to select the left subclavian artery and imaging of the left arm was then obtained by hand-injection, reintroducing the wire and then advancing the catheter more distally for each injection.  Ultimately a 150 cm seeker catheter was used to get the images of the radial and ulnar arteries.  After advancing the seeker catheter into the radial artery and imaging the distal radial artery the seeker catheter was pulled back into the brachial the wire reintroduced and then the ulnar artery was selected with the seeker catheter advancing the seeker catheter into the ulnar artery representing the additional third order catheter placement  The left brachiocephalic access was imaged from  the mid brachial artery injection.  Based on the images,  Several very large branches off the fistula itself possibly  adding to the volume flow and contributing to his symptoms however contrast injection from the proximal brachial artery clearly demonstrates arterial flow to the hand via the ulnar and the radial arteries and no significant atherosclerotic stenosis or stricture is identified proximally or distally.    The detector was then used to image the right common femoral artery in an RAO projection.  StarClose device was deployed without difficulty.  A sterile bandage was applied to the puncture site.    COMPLICATIONS: None  CONDITION: Russell Grant, M.D Russell Grant: [phone]  06/27/2024 2:05 PM

## 2024-06-27 NOTE — Interval H&P Note (Signed)
 History and Physical Interval Note:  06/27/2024 12:41 PM  Russell Grant  has presented today for surgery, with the diagnosis of LUE Angio    Steal syndrome.  The various methods of treatment have been discussed with the patient and family. After consideration of risks, benefits and other options for treatment, the patient has consented to  Procedure(s): Upper Extremity Angiography (Left) as a surgical intervention.  The patient's history has been reviewed, patient examined, no change in status, stable for surgery.  I have reviewed the patient's chart and labs.  Questions were answered to the patient's satisfaction.     Cordella Shawl

## 2024-06-27 NOTE — Interval H&P Note (Signed)
 History and Physical Interval Note:  06/27/2024 11:04 AM  Pistol D Olander  has presented today for surgery, with the diagnosis of LUE Angio    Steal syndrome.  The various methods of treatment have been discussed with the patient and family. After consideration of risks, benefits and other options for treatment, the patient has consented to  Procedure(s): Upper Extremity Angiography (Left) as a surgical intervention.  The patient's history has been reviewed, patient examined, no change in status, stable for surgery.  I have reviewed the patient's chart and labs.  Questions were answered to the patient's satisfaction.     Cordella Shawl

## 2024-06-28 ENCOUNTER — Encounter: Payer: Self-pay | Admitting: Vascular Surgery

## 2024-06-30 ENCOUNTER — Encounter: Payer: Self-pay | Admitting: Vascular Surgery
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
## 2024-07-17 ENCOUNTER — Ambulatory Visit: Admitting: Orthopedic Surgery

## 2024-07-18 ENCOUNTER — Encounter: Payer: Self-pay | Admitting: Orthopedic Surgery

## 2024-07-18 ENCOUNTER — Ambulatory Visit (INDEPENDENT_AMBULATORY_CARE_PROVIDER_SITE_OTHER): Admitting: Orthopedic Surgery

## 2024-07-18 VITALS — BP 130/78 | Wt 273.6 lb

## 2024-07-18 DIAGNOSIS — M48062 Spinal stenosis, lumbar region with neurogenic claudication: Secondary | ICD-10-CM

## 2024-07-18 DIAGNOSIS — Z981 Arthrodesis status: Secondary | ICD-10-CM

## 2024-07-18 DIAGNOSIS — M47816 Spondylosis without myelopathy or radiculopathy, lumbar region: Secondary | ICD-10-CM | POA: Diagnosis not present

## 2024-07-18 DIAGNOSIS — M5416 Radiculopathy, lumbar region: Secondary | ICD-10-CM

## 2024-07-18 DIAGNOSIS — M51362 Other intervertebral disc degeneration, lumbar region with discogenic back pain and lower extremity pain: Secondary | ICD-10-CM | POA: Diagnosis not present

## 2024-07-18 DIAGNOSIS — M47812 Spondylosis without myelopathy or radiculopathy, cervical region: Secondary | ICD-10-CM

## 2024-07-18 DIAGNOSIS — M48061 Spinal stenosis, lumbar region without neurogenic claudication: Secondary | ICD-10-CM

## 2024-07-18 DIAGNOSIS — R202 Paresthesia of skin: Secondary | ICD-10-CM

## 2024-07-18 DIAGNOSIS — M50323 Other cervical disc degeneration at C6-C7 level: Secondary | ICD-10-CM

## 2024-07-18 DIAGNOSIS — M50321 Other cervical disc degeneration at C4-C5 level: Secondary | ICD-10-CM

## 2024-07-18 NOTE — Patient Instructions (Signed)
 It was so nice to see you today. Thank you so much for coming in.    You have some wear and tear in your back. I want to review your imaging with Dr. Clois to see what he thinks about possible surgery. I will message you with his recommendations.   You would need to do PT prior to any back surgery and would also need to lose some weight.   I want to get an EMG (nerve conduction test) to look into numbness/tingling in your left hand. I have ordered this and [facility] will call you to schedule. You can also call them at [phone].   Please do not hesitate to call if you have any questions or concerns. You can also message me in MyChart.   Glade Boys PA-C [phone]     The physicians and staff at [facility] at [facility] are committed to providing excellent care. You may receive a survey asking for feedback about your experience at our office. We value you your feedback and appreciate you taking the time to to fill it out. The [facility] leadership team is also available to discuss your experience in person, feel free to contact us  [phone].

## 2024-07-24 ENCOUNTER — Encounter (INDEPENDENT_AMBULATORY_CARE_PROVIDER_SITE_OTHER): Payer: Self-pay | Admitting: Vascular Surgery

## 2024-07-24 ENCOUNTER — Ambulatory Visit (INDEPENDENT_AMBULATORY_CARE_PROVIDER_SITE_OTHER): Admitting: Vascular Surgery

## 2024-07-24 VITALS — BP 101/61 | HR 76 | Resp 17 | Ht 70.0 in | Wt 273.2 lb

## 2024-07-24 DIAGNOSIS — E1129 Type 2 diabetes mellitus with other diabetic kidney complication: Secondary | ICD-10-CM

## 2024-07-24 DIAGNOSIS — N184 Chronic kidney disease, stage 4 (severe): Secondary | ICD-10-CM | POA: Diagnosis not present

## 2024-07-24 DIAGNOSIS — E785 Hyperlipidemia, unspecified: Secondary | ICD-10-CM | POA: Diagnosis not present

## 2024-07-24 DIAGNOSIS — I10 Essential (primary) hypertension: Secondary | ICD-10-CM
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
## 2024-08-07 ENCOUNTER — Encounter: Payer: Self-pay | Admitting: Neurology

## 2024-08-09 ENCOUNTER — Other Ambulatory Visit: Payer: Self-pay

## 2024-08-09 DIAGNOSIS — R202 Paresthesia of skin: Secondary | ICD-10-CM

## 2024-08-12 ENCOUNTER — Encounter (INDEPENDENT_AMBULATORY_CARE_PROVIDER_SITE_OTHER): Payer: Self-pay | Admitting: Vascular Surgery

## 2024-10-24 ENCOUNTER — Encounter: Admitting: Neurology

## 2025-01-25 ENCOUNTER — Encounter (INDEPENDENT_AMBULATORY_CARE_PROVIDER_SITE_OTHER)

## 2025-01-25 ENCOUNTER — Ambulatory Visit (INDEPENDENT_AMBULATORY_CARE_PROVIDER_SITE_OTHER): Admitting: Vascular Surgery
